# Patient Record
Sex: Female | Born: 1960
Health system: Southern US, Community
[De-identification: ages and names within clinical notes are randomized; demographics above are authoritative.]

## PROBLEM LIST (undated history)

## (undated) DIAGNOSIS — I10 Essential (primary) hypertension: Secondary | ICD-10-CM

## (undated) DIAGNOSIS — I00 Rheumatic fever without heart involvement: Secondary | ICD-10-CM

## (undated) HISTORY — PX: ABDOMINAL HYSTERECTOMY: SHX81

## (undated) HISTORY — DX: Essential (primary) hypertension: I10

## (undated) HISTORY — PX: TUBAL LIGATION: SHX77

## (undated) HISTORY — PX: AUGMENTATION MAMMAPLASTY: SUR837

## (undated) HISTORY — PX: OOPHORECTOMY: SHX86

## (undated) HISTORY — PX: TONSILLECTOMY: SHX5217

## (undated) HISTORY — PX: RIGHT OOPHORECTOMY: SHX2359

---

## 1984-12-21 DIAGNOSIS — I00 Rheumatic fever without heart involvement: Secondary | ICD-10-CM

## 1984-12-21 HISTORY — DX: Rheumatic fever without heart involvement: I00

## 2000-12-21 HISTORY — PX: CARDIAC CATHETERIZATION: SHX172

## 2006-10-18 ENCOUNTER — Ambulatory Visit: Payer: Self-pay | Admitting: *Deleted

## 2006-10-29 ENCOUNTER — Ambulatory Visit: Payer: Self-pay | Admitting: *Deleted

## 2006-11-04 ENCOUNTER — Ambulatory Visit: Payer: Self-pay | Admitting: Obstetrics and Gynecology

## 2006-12-21 HISTORY — PX: MASTOPEXY: SUR857

## 2008-10-29 ENCOUNTER — Ambulatory Visit: Payer: Self-pay | Admitting: Internal Medicine

## 2009-10-30 ENCOUNTER — Ambulatory Visit: Payer: Self-pay | Admitting: Internal Medicine

## 2010-10-31 ENCOUNTER — Ambulatory Visit: Payer: Self-pay | Admitting: Internal Medicine

## 2010-11-05 ENCOUNTER — Ambulatory Visit: Payer: Self-pay | Admitting: Internal Medicine

## 2011-11-09 ENCOUNTER — Other Ambulatory Visit: Payer: Self-pay | Admitting: Internal Medicine

## 2011-11-09 MED ORDER — LISINOPRIL-HYDROCHLOROTHIAZIDE 20-12.5 MG PO TABS: 1.0000 | ORAL_TABLET | Freq: Every day | ORAL | Status: DC

## 2011-12-04 ENCOUNTER — Ambulatory Visit (INDEPENDENT_AMBULATORY_CARE_PROVIDER_SITE_OTHER): Payer: BC Managed Care – PPO | Admitting: Internal Medicine

## 2011-12-04 ENCOUNTER — Encounter: Payer: Self-pay | Admitting: Internal Medicine

## 2011-12-04 DIAGNOSIS — Z1239 Encounter for other screening for malignant neoplasm of breast: Secondary | ICD-10-CM

## 2011-12-04 DIAGNOSIS — Z79899 Other long term (current) drug therapy: Secondary | ICD-10-CM

## 2011-12-04 LAB — BASIC METABOLIC PANEL
CO2: 28 mEq/L (ref 19–32)
Calcium: 9.8 mg/dL (ref 8.4–10.5)
Chloride: 106 mEq/L (ref 96–112)
Glucose, Bld: 81 mg/dL (ref 70–99)
Potassium: 4.1 mEq/L (ref 3.5–5.3)
Sodium: 142 mEq/L (ref 135–145)

## 2011-12-04 NOTE — Progress Notes (Signed)
  Subjective:    Patient ID: Curly Rim, female    DOB: 05/07/1961, 50 y.o.   MRN: [id number]  HPI  Right middle felxro tendon nodule painful only with gripping the steering wheel or car seat    Review of Systems     Objective:   Physical Exam        Assessment & Plan:   Subjective:     Raelyn Racette is a 50 y.o. female here for a routine exam.  Current complaints: WEIGHT GAIN.  Personal health questionnaire reviewed: no.   Gynecologic History No LMP recorded. Contraception: none Last Pap: 2009. Results were: normal Last mammogram: 2011. Results were: normal  Obstetric History OB History    Grav Para Term Preterm Abortions TAB SAB Ect Mult Living                   The following portions of the patient's history were reviewed and updated as appropriate: allergies, current medications, past family history, past medical history, past social history, past surgical history and problem list.  Review of Systems A comprehensive review of systems was negative.    Objective:    BP 120/82  Pulse 87  Temp(Src) 98.1 F (36.7 C) (Oral)  Ht 5\' 3"  (1.6 m)  Wt 153 lb (69.4 kg)  BMI 27.10 kg/m2  SpO2 98% General appearance: alert, cooperative and appears stated age Eyes: conjunctivae/corneas clear. PERRL, EOM's intact. Fundi benign. Ears: normal TM's and external ear canals both ears Nose: Nares normal. Septum midline. Mucosa normal. No drainage or sinus tenderness. Throat: lips, mucosa, and tongue normal; teeth and gums normal Neck: no adenopathy, no carotid bruit, no JVD, supple, symmetrical, trachea midline and thyroid not enlarged, symmetric, no tenderness/mass/nodules Lungs: clear to auscultation bilaterally Breasts: normal appearance, no masses or tenderness Heart: regular rate and rhythm, S1, S2 normal, no murmur, click, rub or gallop Abdomen: soft, non-tender; bowel sounds normal; no masses,  no organomegaly Extremities: extremities normal, atraumatic, no  cyanosis or edema Pulses: 2+ and symmetric Neurologic: Grossly normal    Assessment:    Healthy female exam.    Plan:    Education reviewed: calcium supplements, low fat, low cholesterol diet, self breast exams and weight bearing exercise. Mammogram ordered. Follow up in: 6 months.

## 2011-12-06 ENCOUNTER — Encounter: Payer: Self-pay | Admitting: Internal Medicine

## 2011-12-08 ENCOUNTER — Ambulatory Visit: Payer: Self-pay | Admitting: Internal Medicine

## 2011-12-09 ENCOUNTER — Telehealth: Payer: Self-pay | Admitting: Internal Medicine

## 2011-12-09 ENCOUNTER — Ambulatory Visit: Payer: Self-pay | Admitting: Internal Medicine

## 2011-12-09 NOTE — Telephone Encounter (Signed)
Left message on voicemail at work for patient to return call. 

## 2011-12-09 NOTE — Telephone Encounter (Signed)
Her mammogram of the left breast was abnormal and they should  have contacted her for additional images and ultrasound. Please confirm

## 2011-12-10 ENCOUNTER — Telehealth: Payer: Self-pay | Admitting: Internal Medicine

## 2011-12-10 NOTE — Telephone Encounter (Signed)
Left message asking patient to return call. 

## 2011-12-10 NOTE — Telephone Encounter (Signed)
Ultrasound of suspicious area on left breast is reporting a simple cyst, probably benign. To repeat mammo and u/s on left breast in 6 months. The alternative is a second opinion at [facility] or locally with eval by Dr. Lemar Livings or Evette Cristal

## 2011-12-10 NOTE — Telephone Encounter (Signed)
Left message asking patient to return my call.

## 2011-12-11 NOTE — Telephone Encounter (Signed)
Patient went for her repeat mamm and it was okayed to follow up in 6 months. Patient already has appt for mamm in 6 months.

## 2012-01-04 ENCOUNTER — Encounter: Payer: Self-pay | Admitting: Internal Medicine

## 2012-01-06 ENCOUNTER — Encounter: Payer: Self-pay | Admitting: Internal Medicine

## 2012-01-07 ENCOUNTER — Telehealth: Payer: Self-pay | Admitting: Internal Medicine

## 2012-01-07 NOTE — Telephone Encounter (Signed)
Her

## 2012-01-11 ENCOUNTER — Ambulatory Visit (INDEPENDENT_AMBULATORY_CARE_PROVIDER_SITE_OTHER): Payer: BC Managed Care – PPO | Admitting: Internal Medicine

## 2012-01-11 ENCOUNTER — Encounter: Payer: Self-pay | Admitting: Internal Medicine

## 2012-01-11 VITALS — BP 124/82 | HR 76 | Temp 98.4°F | Wt 153.0 lb

## 2012-01-11 DIAGNOSIS — J40 Bronchitis, not specified as acute or chronic: Secondary | ICD-10-CM

## 2012-01-11 DIAGNOSIS — J1100 Influenza due to unidentified influenza virus with unspecified type of pneumonia: Secondary | ICD-10-CM | POA: Insufficient documentation

## 2012-01-11 MED ORDER — FLUCONAZOLE 150 MG PO TABS: 150.0000 mg | ORAL_TABLET | Freq: Every day | ORAL | Status: AC

## 2012-01-11 MED ORDER — AZITHROMYCIN 500 MG PO TABS: 500.0000 mg | ORAL_TABLET | Freq: Every day | ORAL | Status: AC

## 2012-01-11 NOTE — Progress Notes (Signed)
Subjective:    Patient ID: Carla Mckinney, female    DOB: 08/02/1961, 51 y.o.   MRN: [id number]   HPI  Healthy  51 yr old white female presents with  one week history of productive cough after caring for grandson who was diagnosed and treated ultimately for RSV .  Patient reports history of fevrrs to 102 and myalgias,  Feels her symptoms were similar to prrior occurrences of influenza.  Fever has resolved but she continues to have  cough productive of green sputum.  Worse in the morning,  Using nyquil for nighttime cough .  Cough is at times paroxysmal and has a whooping quality.  Past Medical History  Diagnosis Date  . Hypertension    Current Outpatient Prescriptions on File Prior to Visit  Medication Sig Dispense Refill  . furosemide (LASIX) 20 MG tablet Take 20 mg by mouth as needed.        Marland Kitchen lisinopril-hydrochlorothiazide (ZESTORETIC) 20-12.5 MG per tablet Take 1 tablet by mouth daily.  30 tablet  3     Review of Systems  Constitutional: Positive for fever and fatigue. Negative for chills and unexpected weight change.  HENT: Positive for postnasal drip. Negative for hearing loss, ear pain, nosebleeds, congestion, sore throat, facial swelling, rhinorrhea, sneezing, mouth sores, trouble swallowing, neck pain, neck stiffness, voice change, sinus pressure, tinnitus and ear discharge.   Eyes: Negative for pain, discharge, redness and visual disturbance.  Respiratory: Positive for cough. Negative for chest tightness, shortness of breath, wheezing and stridor.   Cardiovascular: Negative for chest pain, palpitations and leg swelling.  Musculoskeletal: Negative for myalgias and arthralgias.  Skin: Negative for color change and rash.  Neurological: Negative for dizziness, weakness, light-headedness and headaches.  Hematological: Negative for adenopathy.   BP 124/82  Pulse 76  Temp(Src) 98.4 F (36.9 C) (Oral)  Wt 153 lb (69.4 kg)  SpO2 98%      Objective:   Physical Exam    Constitutional: She is oriented to person, place, and time. She appears well-developed and well-nourished.  HENT:  Mouth/Throat: Posterior oropharyngeal erythema present.  Eyes: EOM are normal. Pupils are equal, round, and reactive to light. No scleral icterus.  Neck: Normal range of motion. Neck supple. No JVD present. No thyromegaly present.  Cardiovascular: Normal rate, regular rhythm, normal heart sounds and intact distal pulses.   Pulmonary/Chest: Effort normal and breath sounds normal.    Abdominal: Soft. Bowel sounds are normal. She exhibits no mass. There is no tenderness.  Musculoskeletal: Normal range of motion. She exhibits no edema.  Lymphadenopathy:    She has cervical adenopathy.       Right cervical: Superficial cervical and deep cervical adenopathy present.       Left cervical: Superficial cervical and deep cervical adenopathy present.  Neurological: She is alert and oriented to person, place, and time.  Skin: Skin is warm and dry.  Psychiatric: She has a normal mood and affect.      Assessment & Plan:   Pneumonia and influenza She is past the window of treatment for influenza, wo will not test or treat.  Will use azithromycin x 7 days for pneumonia . CXR in one week if no improvement in purulent sputum    Updated Medication List Outpatient Encounter Prescriptions as of 01/11/2012  Medication Sig Dispense Refill  . furosemide (LASIX) 20 MG tablet Take 20 mg by mouth as needed.        Marland Kitchen lisinopril-hydrochlorothiazide (ZESTORETIC) 20-12.5 MG per tablet Take 1 tablet  by mouth daily.  30 tablet  3  . azithromycin (ZITHROMAX) 500 MG tablet Take 1 tablet (500 mg total) by mouth daily.  7 tablet  0  . fluconazole (DIFLUCAN) 150 MG tablet Take 1 tablet (150 mg total) by mouth daily.  2 tablet  0

## 2012-01-11 NOTE — Assessment & Plan Note (Signed)
She is past the window of treatment for influenza, wo will not test or treat.  Will use azithromycin x 7 days for pneumonia . CXR in one week if no improvement in purulent sputum

## 2012-01-12 ENCOUNTER — Telehealth: Payer: Self-pay | Admitting: *Deleted

## 2012-01-12 MED ORDER — HYDROCOD POLST-CHLORPHEN POLST 10-8 MG/5ML PO LQCR: ORAL | Status: DC

## 2012-01-12 NOTE — Telephone Encounter (Signed)
Pt states she was seen in the office yesterday and would like to have tussionex called to [facility].  She says she discussed this with you yesterday.

## 2012-01-12 NOTE — Telephone Encounter (Signed)
Yes, you can call in Tussionex generic syrup  1 teaspoon  Every 12 hours as needed for cough  Qty 200 ml no refills. thanks

## 2012-01-12 NOTE — Telephone Encounter (Signed)
Medicine called to pharmacy. 

## 2012-02-02 ENCOUNTER — Other Ambulatory Visit: Payer: Self-pay | Admitting: *Deleted

## 2012-02-02 MED ORDER — FUROSEMIDE 20 MG PO TABS: 20.0000 mg | ORAL_TABLET | ORAL | Status: DC | PRN

## 2012-03-02 ENCOUNTER — Other Ambulatory Visit: Payer: Self-pay | Admitting: Internal Medicine

## 2012-03-02 MED ORDER — LISINOPRIL-HYDROCHLOROTHIAZIDE 20-12.5 MG PO TABS: 1.0000 | ORAL_TABLET | Freq: Every day | ORAL | Status: DC

## 2012-03-10 ENCOUNTER — Telehealth: Payer: Self-pay | Admitting: Internal Medicine

## 2012-03-10 NOTE — Telephone Encounter (Signed)
Did that letter come from [facility]?  Because now that i have looked through her chart,  The additional  Views were scanned in 3 months ago,   From [place]

## 2012-03-10 NOTE — Telephone Encounter (Signed)
I called patient like you asked to see if she had her additional views.  Patient stated she did have her additional views and they were normal.

## 2012-03-11 NOTE — Telephone Encounter (Signed)
Yes, the letter came from [place].  Patient stated they sent her one too and when she called them to say she already had the views done, they told her the letter went out before she had the views.

## 2012-03-28 ENCOUNTER — Encounter: Payer: Self-pay | Admitting: Internal Medicine

## 2012-05-10 ENCOUNTER — Telehealth: Payer: Self-pay | Admitting: Internal Medicine

## 2012-05-10 DIAGNOSIS — N63 Unspecified lump in unspecified breast: Secondary | ICD-10-CM

## 2012-05-10 NOTE — Telephone Encounter (Signed)
ON PRINTER

## 2012-05-10 NOTE — Telephone Encounter (Signed)
HEATHER @ [place] [phone] CALLED NEEDS ORDER FOR Carla Mckinney 6 MONTH FOLLOW UP  LEFT BREASTMAMMOGRAM WITH LEFT ULTRA SOUND

## 2012-05-11 NOTE — Telephone Encounter (Signed)
Order signed and sent to [facility]

## 2012-06-01 ENCOUNTER — Encounter: Payer: Self-pay | Admitting: Internal Medicine

## 2012-06-03 ENCOUNTER — Ambulatory Visit: Payer: BC Managed Care – PPO | Admitting: Internal Medicine

## 2012-06-08 ENCOUNTER — Ambulatory Visit (INDEPENDENT_AMBULATORY_CARE_PROVIDER_SITE_OTHER): Payer: BC Managed Care – PPO | Admitting: Internal Medicine

## 2012-06-08 ENCOUNTER — Encounter: Payer: Self-pay | Admitting: Internal Medicine

## 2012-06-08 VITALS — BP 104/78 | HR 76 | Temp 98.0°F | Resp 16 | Wt 136.2 lb

## 2012-06-08 DIAGNOSIS — Z79899 Other long term (current) drug therapy: Secondary | ICD-10-CM

## 2012-06-08 DIAGNOSIS — Z13220 Encounter for screening for lipoid disorders: Secondary | ICD-10-CM

## 2012-06-08 DIAGNOSIS — I10 Essential (primary) hypertension: Secondary | ICD-10-CM

## 2012-06-08 MED ORDER — LISINOPRIL 20 MG PO TABS: 20.0000 mg | ORAL_TABLET | Freq: Every day | ORAL | Status: DC

## 2012-06-12 ENCOUNTER — Encounter: Payer: Self-pay | Admitting: Internal Medicine

## 2012-06-12 NOTE — Progress Notes (Signed)
Patient ID: Carla Mckinney, female   DOB: 04-12-1961, 51 y.o.   MRN: [id number]  Follow up on hypertension. Subjective:    Patient here for follow-up of elevated blood pressure.  She is exercising and is adherent to a low-salt diet.  Blood pressure is well controlled at home. Cardiac symptoms: none. Patient denies: chest pain, dyspnea, irregular heart beat, palpitations and tachypnea. Cardiovascular risk factors: none. Use of agents associated with hypertension: none. History of target organ damage: none.  The following portions of the patient's history were reviewed and updated as appropriate: allergies, current medications, past family history, past medical history, past social history, past surgical history and problem list.  Review of Systems A comprehensive review of systems was negative.     Objective:    BP 104/78  Pulse 76  Temp 98 F (36.7 C) (Oral)  Resp 16  Wt 136 lb 4 oz (61.803 kg)  SpO2 98% General appearance: alert, cooperative and appears stated age Neck: no adenopathy, no carotid bruit, no JVD, supple, symmetrical, trachea midline and thyroid not enlarged, symmetric, no tenderness/mass/nodules Lungs: clear to auscultation bilaterally Heart: regular rate and rhythm, S1, S2 normal, no murmur, click, rub or gallop Abdomen: soft, non-tender; bowel sounds normal; no masses,  no organomegaly Extremities: extremities normal, atraumatic, no cyanosis or edema Pulses: 2+ and symmetric    Assessment:    Hypertension, stage 1  Well controlled.  Evidence of target organ damage: none.    Plan:    Medication: no change. Regular aerobic exercise. Check blood pressures weekly and record. Follow up: 6 months and as needed.  Fasting lipids and assessment of renal function due; return for labs

## 2012-06-13 ENCOUNTER — Ambulatory Visit: Payer: Self-pay | Admitting: Internal Medicine

## 2012-06-15 ENCOUNTER — Other Ambulatory Visit (INDEPENDENT_AMBULATORY_CARE_PROVIDER_SITE_OTHER): Payer: BC Managed Care – PPO | Admitting: *Deleted

## 2012-06-15 DIAGNOSIS — Z79899 Other long term (current) drug therapy: Secondary | ICD-10-CM

## 2012-06-15 DIAGNOSIS — Z13220 Encounter for screening for lipoid disorders: Secondary | ICD-10-CM

## 2012-06-15 LAB — LIPID PANEL
HDL: 63.5 mg/dL (ref >39.00)
LDL Cholesterol: 120 mg/dL — ABNORMAL HIGH (ref 0–99)
Total CHOL/HDL Ratio: 3
Triglycerides: 63 mg/dL (ref 0.0–149.0)
VLDL: 12.6 mg/dL (ref 0.0–40.0)

## 2012-06-15 LAB — COMPREHENSIVE METABOLIC PANEL
Albumin: 4 g/dL (ref 3.5–5.2)
Calcium: 9.4 mg/dL (ref 8.4–10.5)
Creatinine, Ser: 0.5 mg/dL (ref 0.4–1.2)
Total Bilirubin: 0.7 mg/dL (ref 0.3–1.2)
Total Protein: 6.7 g/dL (ref 6.0–8.3)

## 2012-06-16 ENCOUNTER — Encounter: Payer: Self-pay | Admitting: Internal Medicine

## 2012-06-16 ENCOUNTER — Telehealth: Payer: Self-pay | Admitting: Internal Medicine

## 2012-06-16 NOTE — Telephone Encounter (Signed)
i called to see if pt had mammogram Pt stated she had mammogram done at [facility] on 6/24  She also had labs done yesterday and wanted to see if she could get results sometime today or tomorrow.  She is leaving town on Saturday for vacation and wanted to get results before if possible

## 2012-06-16 NOTE — Telephone Encounter (Signed)
I will call patient with her results as soon as Dr. Darrick Huntsman lets me know.

## 2012-06-16 NOTE — Telephone Encounter (Signed)
Patient left a voicemail on my phone stating she was returning your call.

## 2012-06-21 ENCOUNTER — Other Ambulatory Visit: Payer: Self-pay | Admitting: Internal Medicine

## 2012-06-21 ENCOUNTER — Telehealth: Payer: Self-pay | Admitting: Internal Medicine

## 2012-06-21 NOTE — Telephone Encounter (Signed)
The status under the message states that she read it.

## 2012-06-21 NOTE — Telephone Encounter (Signed)
Left message asking patient to return my call.

## 2012-06-21 NOTE — Telephone Encounter (Signed)
I sent message to pt this morning via e mail.  Please confirm that she got it

## 2012-07-11 ENCOUNTER — Encounter: Payer: Self-pay | Admitting: Internal Medicine

## 2013-02-05 ENCOUNTER — Other Ambulatory Visit: Payer: Self-pay

## 2013-02-07 ENCOUNTER — Ambulatory Visit (INDEPENDENT_AMBULATORY_CARE_PROVIDER_SITE_OTHER): Payer: BC Managed Care – PPO | Admitting: Internal Medicine

## 2013-02-07 ENCOUNTER — Encounter: Payer: Self-pay | Admitting: Internal Medicine

## 2013-02-07 VITALS — BP 122/80 | HR 69 | Temp 98.1°F | Resp 16 | Wt 145.2 lb

## 2013-02-07 DIAGNOSIS — J019 Acute sinusitis, unspecified: Secondary | ICD-10-CM

## 2013-02-07 MED ORDER — PREDNISONE (PAK) 10 MG PO TABS: ORAL_TABLET | ORAL | Status: DC

## 2013-02-07 MED ORDER — CULTURELLE DIGESTIVE HEALTH PO CAPS: 1.0000 | ORAL_CAPSULE | Freq: Every day | ORAL | Status: DC

## 2013-02-07 MED ORDER — BENZONATATE 200 MG PO CAPS: 200.0000 mg | ORAL_CAPSULE | Freq: Two times a day (BID) | ORAL | Status: DC | PRN

## 2013-02-07 MED ORDER — LEVOFLOXACIN 500 MG PO TABS: 500.0000 mg | ORAL_TABLET | Freq: Every day | ORAL | Status: DC

## 2013-02-07 NOTE — Patient Instructions (Addendum)
You have a sinus/ear infection   .  I am prescribing an antibiotic (levaquin ) and prednisone taper  To manage the infectin and the inflammation in your ear/sinuses.   I also advise use of the following OTC meds to help with your other symptoms.   Take generic OTC benadryl 25 mg every 8 hours for the drainage,  Sudafed PE  10 to 30 mg every 8 hours for the congestion, you may substitute Afrin nasal spray for the nighttime dose of sudafed PE  If needed to prevent insomnia.  flushes your sinuses twice daily with Simply Saline (do over the sink because if you do it right you will spit out globs of mucus)  Use benzonatate capsules  FOR THE COUGH.  Gargle with salt water as needed for sore throat.

## 2013-02-07 NOTE — Progress Notes (Signed)
Patient ID: Carla Mckinney, female   DOB: September 27, 1961, 52 y.o.   MRN: [id number]  Patient Active Problem List  Diagnosis  . Acute sinusitis, unspecified    Subjective:  CC:   Chief Complaint  Patient presents with  . Cough    yellow/greem phlegm    HPI: Sinus congestion,  Cough productive of brown blood streaked sputum but alsop having similar discharge from sinuses.  Her symptoms started a week  Going to [facility] on Saturday.   Waking up with eyes matted shut.  No flu like symptoms. Cough keeping her up at night .  Has tussionex but has not tried it .    Carla Thompsonis a 52 y.o. female who presents   Past Medical History  Diagnosis Date  . Hypertension     Past Surgical History  Procedure Laterality Date  . Tubal ligation    . Abdominal hysterectomy    . Oophorectomy      secondary to scar tissue,  [facility]  . Cardiac catheterization  2002    normal, due to syncope and abnormal myowiew ([facility])       The following portions of the patient's history were reviewed and updated as appropriate: Allergies, current medications, and problem list.    Review of Systems:   12 Pt  review of systems was negative except those addressed in the HPI.      History   Social History  . Marital Status: Married    Spouse Name: N/A    Number of Children: N/A  . Years of Education: N/A   Occupational History  . Not on file.   Social History Main Topics  . Smoking status: Never Smoker   . Smokeless tobacco: Never Used  . Alcohol Use: Yes  . Drug Use: No  . Sexually Active: Not on file   Other Topics Concern  . Not on file   Social History Narrative  . No narrative on file    Objective:  BP 122/80  Pulse 69  Temp(Src) 98.1 F (36.7 C) (Oral)  Resp 16  Wt 145 lb 4 oz (65.885 kg)  BMI 25.74 kg/m2  SpO2 93%  General appearance: alert, cooperative and appears stated age Ears: bilalteral effusions and mild injection of TM.  Sinuses tender maxillary bilaterally Throat:  lips, mucosa, and tongue normal; teeth and gums normal. Tonsils red.  Neck: mild cervical adenopathy, no carotid bruit, supple, symmetrical, trachea midline and thyroid not enlarged, symmetric, no tenderness/mass/nodules Back: symmetric, no curvature. ROM normal. No CVA tenderness. Lungs: clear to auscultation bilaterally Heart: regular rate and rhythm, S1, S2 normal, no murmur, click, rub or gallop Abdomen: soft, non-tender; bowel sounds normal; no masses,  no organomegaly Pulses: 2+ and symmetric Skin: Skin color, texture, turgor normal. No rashes or lesions Lymph nodes: Cervical, supraclavicular, and axillary nodes normal.  Assessment and Plan:  Acute sinusitis, unspecified Given chronicity of symptoms, development of facial pain and exam consistent with bacterial URI,  Will treat with empiric antibiotics, decongestants, and saline lavage.    Updated Medication List Outpatient Encounter Prescriptions as of 02/07/2013  Medication Sig Dispense Refill  . furosemide (LASIX) 20 MG tablet Take 1 tablet (20 mg total) by mouth as needed.  30 tablet  2  . lisinopril (PRINIVIL,ZESTRIL) 20 MG tablet Take 1 tablet (20 mg total) by mouth daily.  90 tablet  3  . benzonatate (TESSALON) 200 MG capsule Take 1 capsule (200 mg total) by mouth 2 (two) times daily as needed for cough.  60 capsule  0  . Lactobacillus-Inulin (CULTURELLE DIGESTIVE HEALTH) CAPS Take 1 capsule by mouth daily.  14 capsule  0  . levofloxacin (LEVAQUIN) 500 MG tablet Take 1 tablet (500 mg total) by mouth daily.  7 tablet  0  . predniSONE (STERAPRED UNI-PAK) 10 MG tablet 6 tablets on Day 1 , then reduce by 1 tablet daily until gone  21 tablet  0   No facility-administered encounter medications on file as of 02/07/2013.     No orders of the defined types were placed in this encounter.    No Follow-up on file.

## 2013-02-08 ENCOUNTER — Encounter: Payer: Self-pay | Admitting: Internal Medicine

## 2013-02-08 DIAGNOSIS — J019 Acute sinusitis, unspecified: Secondary | ICD-10-CM | POA: Insufficient documentation

## 2013-02-08 NOTE — Assessment & Plan Note (Signed)
Given chronicity of symptoms, development of facial pain and exam consistent with bacterial URI,  Will treat with empiric antibiotics, decongestants, and saline lavage.   

## 2013-02-22 ENCOUNTER — Encounter: Payer: Self-pay | Admitting: Internal Medicine

## 2013-02-23 ENCOUNTER — Encounter: Payer: Self-pay | Admitting: Internal Medicine

## 2013-02-23 ENCOUNTER — Ambulatory Visit (INDEPENDENT_AMBULATORY_CARE_PROVIDER_SITE_OTHER): Payer: BC Managed Care – PPO | Admitting: Internal Medicine

## 2013-02-23 VITALS — BP 108/80 | HR 72 | Temp 98.2°F | Resp 16 | Ht 64.0 in | Wt 146.0 lb

## 2013-02-23 DIAGNOSIS — E785 Hyperlipidemia, unspecified: Secondary | ICD-10-CM

## 2013-02-23 DIAGNOSIS — I10 Essential (primary) hypertension: Secondary | ICD-10-CM

## 2013-02-23 DIAGNOSIS — A499 Bacterial infection, unspecified: Secondary | ICD-10-CM

## 2013-02-23 DIAGNOSIS — N63 Unspecified lump in unspecified breast: Secondary | ICD-10-CM

## 2013-02-23 DIAGNOSIS — R928 Other abnormal and inconclusive findings on diagnostic imaging of breast: Secondary | ICD-10-CM

## 2013-02-23 DIAGNOSIS — R040 Epistaxis: Secondary | ICD-10-CM

## 2013-02-23 DIAGNOSIS — R5383 Other fatigue: Secondary | ICD-10-CM

## 2013-02-23 DIAGNOSIS — R5381 Other malaise: Secondary | ICD-10-CM

## 2013-02-23 DIAGNOSIS — B9689 Other specified bacterial agents as the cause of diseases classified elsewhere: Secondary | ICD-10-CM

## 2013-02-23 DIAGNOSIS — J329 Chronic sinusitis, unspecified: Secondary | ICD-10-CM

## 2013-02-23 LAB — LIPID PANEL
Cholesterol: 226 mg/dL — ABNORMAL HIGH (ref 0–200)
Total CHOL/HDL Ratio: 3
Triglycerides: 50 mg/dL (ref 0.0–149.0)
VLDL: 10 mg/dL (ref 0.0–40.0)

## 2013-02-23 LAB — CBC WITH DIFFERENTIAL/PLATELET
Basophils Absolute: 0 10*3/uL (ref 0.0–0.1)
Eosinophils Absolute: 0.1 10*3/uL (ref 0.0–0.7)
Lymphocytes Relative: 30.8 % (ref 12.0–46.0)
MCHC: 34.2 g/dL (ref 30.0–36.0)
MCV: 94.1 fl (ref 78.0–100.0)
Monocytes Absolute: 0.4 10*3/uL (ref 0.1–1.0)
Neutrophils Relative %: 58.3 % (ref 43.0–77.0)
Platelets: 236 10*3/uL (ref 150.0–400.0)
RBC: 3.97 Mil/uL (ref 3.87–5.11)
RDW: 13.5 % (ref 11.5–14.6)

## 2013-02-23 LAB — COMPREHENSIVE METABOLIC PANEL
AST: 25 U/L (ref 0–37)
Albumin: 4 g/dL (ref 3.5–5.2)
Alkaline Phosphatase: 48 U/L (ref 39–117)
Glucose, Bld: 84 mg/dL (ref 70–99)
Potassium: 4.2 mEq/L (ref 3.5–5.1)
Sodium: 136 mEq/L (ref 135–145)
Total Bilirubin: 1.2 mg/dL (ref 0.3–1.2)
Total Protein: 6.8 g/dL (ref 6.0–8.3)

## 2013-02-23 LAB — LDL CHOLESTEROL, DIRECT: Direct LDL: 154.1 mg/dL

## 2013-02-23 LAB — TSH: TSH: 1.93 u[IU]/mL (ref 0.35–5.50)

## 2013-02-23 NOTE — Progress Notes (Signed)
Patient ID: Carla Mckinney, female   DOB: 07-12-1961, 52 y.o.   MRN: [id number]   . Patient Active Problem List  Diagnosis  . Sinusitis, bacterial  . Essential hypertension, benign  . Other and unspecified hyperlipidemia    Subjective:  CC:   Chief Complaint  Patient presents with  . Med Refill    HPI:   Carla Thompsonis a 52 y.o. female who presents for 6 month follow up on hypertension.  She was recently treated for Sinusitis with antibiotics and steroid taper  .  All symptoms of cough, headache,  sinus congestion,  and facial/ear pain resolved but she continues to have streaks of blood in her nasal drainage. She believes that the blood is coming from both sides.      Past Medical History  Diagnosis Date  . Hypertension     Past Surgical History  Procedure Laterality Date  . Tubal ligation    . Abdominal hysterectomy    . Oophorectomy      secondary to scar tissue,  [facility]  . Cardiac catheterization  2002    normal, due to syncope and abnormal myowiew ([facility])       The following portions of the patient's history were reviewed and updated as appropriate: Allergies, current medications, and problem list.    Review of Systems:   Patient denies headache, fevers, malaise, unintentional weight loss, skin rash, eye pain, sinus congestion and sinus pain, sore throat, dysphagia,  hemoptysis , cough, dyspnea, wheezing, chest pain, palpitations, orthopnea, edema, abdominal pain, nausea, melena, diarrhea, constipation, flank pain, dysuria, hematuria, urinary  Frequency, nocturia, numbness, tingling, seizures,  Focal weakness, Loss of consciousness,  Tremor, insomnia, depression, anxiety, and suicidal ideation.     History   Social History  . Marital Status: Married    Spouse Name: N/A    Number of Children: N/A  . Years of Education: N/A   Occupational History  . Not on file.   Social History Main Topics  . Smoking status: Never Smoker   . Smokeless tobacco: Never  Used  . Alcohol Use: Yes  . Drug Use: No  . Sexually Active: Not on file   Other Topics Concern  . Not on file   Social History Narrative  . No narrative on file    Objective:  BP 108/80  Pulse 72  Temp(Src) 98.2 F (36.8 C) (Oral)  Resp 16  Ht 5\' 4"  (1.626 m)  Wt 146 lb (66.225 kg)  BMI 25.05 kg/m2  SpO2 98%  General appearance: alert, cooperative and appears stated age Ears/Nose: normal TM's and external ear canals both ears. Left nasal septum red and ulcerated. Throat: lips, mucosa, and tongue normal; teeth and gums normal Neck: no adenopathy, no carotid bruit, supple, symmetrical, trachea midline and thyroid not enlarged, symmetric, no tenderness/mass/nodules Back: symmetric, no curvature. ROM normal. No CVA tenderness. Lungs: clear to auscultation bilaterally Heart: regular rate and rhythm, S1, S2 normal, no murmur, click, rub or gallop Abdomen: soft, non-tender; bowel sounds normal; no masses,  no organomegaly Pulses: 2+ and symmetric Skin: Skin color, texture, turgor normal. No rashes or lesions Lymph nodes: Cervical, supraclavicular, and axillary nodes normal.  Assessment and Plan:  Sinusitis, bacterial Her symptoms have resolved with seven-day course of Levaquin and 6 day prednisone taper, with the exception of continued bloody nasal drainage. She does have an ulcerated area on her left septal wall. Platelet count was normal today and there are no signs of infection. Advised to use saline irrigations and  apply sterile petroleum jelly to septal wall for moisturization. If symptoms fail to resolve she'll need to see ENT for fiberoptic evaluation .   Essential hypertension, benign Well controlled with low-dose lisinopril and when necessary furosemide.. renal function is normal.  Other and unspecified hyperlipidemia Mild, LDL 154. Her risk factors include hypertension and a family history. However her mother's coronary artery disease is likely accelerated by her  tobacco abuse. As long as her HDL remains high at 68 I see no reason to start her on statin therapy.   Updated Medication List Outpatient Encounter Prescriptions as of 02/23/2013  Medication Sig Dispense Refill  . furosemide (LASIX) 20 MG tablet Take 1 tablet (20 mg total) by mouth as needed.  30 tablet  2  . Lactobacillus-Inulin (CULTURELLE DIGESTIVE HEALTH) CAPS Take 1 capsule by mouth daily.  14 capsule  0  . levofloxacin (LEVAQUIN) 500 MG tablet Take 1 tablet (500 mg total) by mouth daily.  7 tablet  0  . lisinopril (PRINIVIL,ZESTRIL) 20 MG tablet Take 1 tablet (20 mg total) by mouth daily.  90 tablet  3  . [DISCONTINUED] benzonatate (TESSALON) 200 MG capsule Take 1 capsule (200 mg total) by mouth 2 (two) times daily as needed for cough.  60 capsule  0  . [DISCONTINUED] predniSONE (STERAPRED UNI-PAK) 10 MG tablet 6 tablets on Day 1 , then reduce by 1 tablet daily until gone  21 tablet  0   No facility-administered encounter medications on file as of 02/23/2013.     Orders Placed This Encounter  Procedures  . Comprehensive metabolic panel  . Lipid panel  . CBC with Differential  . TSH  . LDL cholesterol, direct  . Ambulatory referral to General Surgery    No Follow-up on file.

## 2013-02-24 DIAGNOSIS — I10 Essential (primary) hypertension: Secondary | ICD-10-CM | POA: Insufficient documentation

## 2013-02-24 DIAGNOSIS — J069 Acute upper respiratory infection, unspecified: Secondary | ICD-10-CM | POA: Insufficient documentation

## 2013-02-24 DIAGNOSIS — E7849 Other hyperlipidemia: Secondary | ICD-10-CM | POA: Insufficient documentation

## 2013-02-24 DIAGNOSIS — E785 Hyperlipidemia, unspecified: Secondary | ICD-10-CM | POA: Insufficient documentation

## 2013-02-24 NOTE — Assessment & Plan Note (Addendum)
Her symptoms have resolved with seven-day course of Levaquin and 6 day prednisone taper, with the exception of continued bloody nasal drainage. She does have an ulcerated area on her left septal wall. Platelet count was normal today and there are no signs of infection. Advised to use saline irrigations and apply sterile petroleum jelly to septal wall for moisturization. If symptoms fail to resolve she'll need to see ENT for fiberoptic evaluation .

## 2013-02-24 NOTE — Assessment & Plan Note (Signed)
Mild, LDL 154. Her risk factors include hypertension and a family history. However her mother's coronary artery disease is likely accelerated by her tobacco abuse. As long as her HDL remains high at 68 I see no reason to start her on statin therapy.

## 2013-02-24 NOTE — Assessment & Plan Note (Signed)
Well controlled with low-dose lisinopril and when necessary furosemide.. renal function is normal.

## 2013-03-23 ENCOUNTER — Encounter: Payer: Self-pay | Admitting: General Surgery

## 2013-03-23 ENCOUNTER — Inpatient Hospital Stay
Admission: RE | Admit: 2013-03-23 | Discharge: 2013-03-23 | Disposition: A | Discharge status: Home / Self Care | Payer: Self-pay | Source: Ambulatory Visit | Attending: General Surgery | Admitting: General Surgery

## 2013-03-23 ENCOUNTER — Ambulatory Visit (INDEPENDENT_AMBULATORY_CARE_PROVIDER_SITE_OTHER): Payer: BC Managed Care – PPO | Admitting: General Surgery

## 2013-03-23 VITALS — BP 120/70 | HR 88 | Resp 12 | Ht 63.0 in | Wt 146.0 lb

## 2013-03-23 DIAGNOSIS — N6002 Solitary cyst of left breast: Secondary | ICD-10-CM | POA: Insufficient documentation

## 2013-03-23 DIAGNOSIS — N6009 Solitary cyst of unspecified breast: Secondary | ICD-10-CM

## 2013-03-23 NOTE — Patient Instructions (Addendum)
Patient advised to leave cyst alone as long as she has no problems associated with the left breast. Patient also advised to discuss with primary care provider about hormone therapy. She is encouraged to do monthly self breast exams.

## 2013-03-23 NOTE — Progress Notes (Signed)
Patient ID: Carla Mckinney, female   DOB: 03/09/1961, 52 y.o.   MRN: [id number]  Chief Complaint  Patient presents with  . Cyst    new patient left breat cyst    HPI Carla Mckinney is a 52 y.o. female who presents for a left breast cyst. She states the cyst has been there approximately 6 years or longer. She is not having any pain or discomfort in this area. Primary care provider seems to think it is getting larger so wanted patient to have it checked out. No known personal or family history of breast problems. The patient did have a mastopexy done approximately around 2008.  HPI  Past Medical History  Diagnosis Date  . Hypertension     Past Surgical History  Procedure Laterality Date  . Tubal ligation    . Abdominal hysterectomy    . Oophorectomy      secondary to scar tissue,  [facility]  . Cardiac catheterization  2002    normal, due to syncope and abnormal myowiew ([facility])  . Tonsillectomy    . Mastopexy  2008    Family History  Problem Relation Age of Onset  . Heart disease Mother   . Hypertension Father   . Hyperlipidemia Father   . Heart disease Maternal Grandfather   . Heart disease Paternal Grandmother   . Stroke Paternal Grandmother 67  . Cancer Paternal Grandfather     multiple myeloma  . Heart failure Mother     Social History History  Substance Use Topics  . Smoking status: Never Smoker   . Smokeless tobacco: Never Used  . Alcohol Use: Yes    No Known Allergies  Current Outpatient Prescriptions  Medication Sig Dispense Refill  . lisinopril (PRINIVIL,ZESTRIL) 20 MG tablet Take 1 tablet (20 mg total) by mouth daily.  90 tablet  3  . Multiple Vitamin (MULTIVITAMIN) capsule Take 1 capsule by mouth daily.       No current facility-administered medications for this visit.    Review of Systems Review of Systems  Constitutional: Negative.   Respiratory: Negative.   Cardiovascular: Negative.     Blood pressure 120/70, pulse 88, resp. rate 12, height 5\' 3"   (1.6 m), weight 146 lb (66.225 kg).  Physical Exam Physical Exam  Constitutional: She appears well-developed and well-nourished.  Neck: Trachea normal. No mass and no thyromegaly present.  Cardiovascular: Normal rate, regular rhythm, normal heart sounds and normal pulses.   No murmur heard. Pulmonary/Chest: Effort normal and breath sounds normal. Right breast exhibits no inverted nipple, no mass, no nipple discharge, no skin change and no tenderness. Left breast exhibits no inverted nipple, no mass, no nipple discharge, no skin change and no tenderness. Breasts are symmetrical.  Left breast cyst noted.   Soft, mobile mass in the lateral aspect of the left breast.  Data Reviewed Ultrasound examination of this area of concern in the left breast:  November 2011 showed a 0.7 x 1.3 cm mass.  December 2012 this was a 1 x 2 cm mass.  June 2013 this was a 0.95 x 3.01 cm mass.  Ultrasound examination completed today showed a 1.33 x 1.96 x 2.41 cm simple cyst in the 3:00 position of the left breast 8 cm from the nipple. No internal echoes, wall thickening or nodularity was noted. Good posterior acoustic enhancement.  Assessment    Asymptomatic left breast cyst.     Plan    Observation is warranted. The patient should continue annual clinical exams with her  primary care provider as well as annual screening mammograms.        Earline Mayotte 03/23/2013, 10:01 PM

## 2013-03-24 ENCOUNTER — Encounter: Payer: Self-pay | Admitting: Internal Medicine

## 2013-04-18 MED ORDER — ESTRADIOL 0.0375 MG/24HR TD PTTW: 1.0000 | MEDICATED_PATCH | TRANSDERMAL | Status: DC

## 2013-08-08 ENCOUNTER — Other Ambulatory Visit: Payer: Self-pay | Admitting: *Deleted

## 2013-08-08 MED ORDER — LISINOPRIL 20 MG PO TABS: 20.0000 mg | ORAL_TABLET | Freq: Every day | ORAL | Status: DC

## 2013-10-26 ENCOUNTER — Other Ambulatory Visit: Payer: Self-pay

## 2013-10-30 ENCOUNTER — Ambulatory Visit (INDEPENDENT_AMBULATORY_CARE_PROVIDER_SITE_OTHER): Payer: Managed Care, Other (non HMO) | Admitting: *Deleted

## 2013-10-30 DIAGNOSIS — Z111 Encounter for screening for respiratory tuberculosis: Secondary | ICD-10-CM

## 2013-11-01 ENCOUNTER — Ambulatory Visit: Payer: Managed Care, Other (non HMO)

## 2013-11-20 ENCOUNTER — Ambulatory Visit: Payer: Self-pay | Admitting: Internal Medicine

## 2013-12-07 ENCOUNTER — Encounter: Payer: Self-pay | Admitting: Internal Medicine

## 2013-12-11 ENCOUNTER — Ambulatory Visit: Payer: Self-pay | Admitting: Otolaryngology

## 2013-12-20 ENCOUNTER — Ambulatory Visit: Payer: Self-pay | Admitting: Internal Medicine

## 2013-12-20 ENCOUNTER — Ambulatory Visit (INDEPENDENT_AMBULATORY_CARE_PROVIDER_SITE_OTHER): Payer: Managed Care, Other (non HMO) | Admitting: Internal Medicine

## 2013-12-20 VITALS — BP 115/60 | HR 69 | Wt 153.0 lb

## 2013-12-20 DIAGNOSIS — I10 Essential (primary) hypertension: Secondary | ICD-10-CM

## 2013-12-20 DIAGNOSIS — R600 Localized edema: Secondary | ICD-10-CM

## 2013-12-20 DIAGNOSIS — R609 Edema, unspecified: Secondary | ICD-10-CM

## 2013-12-20 DIAGNOSIS — R918 Other nonspecific abnormal finding of lung field: Secondary | ICD-10-CM

## 2013-12-20 DIAGNOSIS — R220 Localized swelling, mass and lump, head: Secondary | ICD-10-CM

## 2013-12-20 DIAGNOSIS — R9389 Abnormal findings on diagnostic imaging of other specified body structures: Secondary | ICD-10-CM

## 2013-12-20 NOTE — Progress Notes (Signed)
Patient ID: Carla Mckinney, female   DOB: 1961/10/18, 52 y.o.   MRN: [id number]  Patient Active Problem List   Diagnosis Date Noted  . Facial edema 12/21/2013  . Facial swelling 12/21/2013  . Cyst of breast, left, benign solitary 03/23/2013  . Essential hypertension, benign 02/24/2013  . Other and unspecified hyperlipidemia 02/24/2013    Subjective:  CC:   Chief Complaint  Patient presents with  . Acute Visit    facial swelling x2wks ago. worse in the mornings. pt states it comes and goes.     HPI:   Carla Thompsonis a 52 y.o. female who presents Recent development of recurrent facial sweeling. First epiosde was Severe facial and occurred dec 18th  Treated with zyrtec for 3 days no better.  No pruritis of skin or eyes.  Labs done and chest x ray  Done, ordered by [facility].  Nothing revealing.  Her lips were slightly swollen.  No tongue swelling,  No cold symptoms ,  No dyspnea or wheezing. Had not traveled, but ate [place] [employer] the night before.  and had a  crabcake  which she had before.  Swelling has improvoed markeldy but she continues to notice puffiness under eyes whe she wakes up.     Past Medical History  Diagnosis Date  . Hypertension     Past Surgical History  Procedure Laterality Date  . Tubal ligation    . Abdominal hysterectomy    . Oophorectomy      secondary to scar tissue,  [facility]  . Cardiac catheterization  2002    normal, due to syncope and abnormal myowiew ([facility])  . Tonsillectomy    . Mastopexy  2008       The following portions of the patient's history were reviewed and updated as appropriate: Allergies, current medications, and problem list.    Review of Systems:   12 Pt  review of systems was negative except those addressed in the HPI,     History   Social History  . Marital Status: Married    Spouse Name: N/A    Number of Children: N/A  . Years of Education: N/A   Occupational History  . Not on file.   Social History  Main Topics  . Smoking status: Never Smoker   . Smokeless tobacco: Never Used  . Alcohol Use: Yes  . Drug Use: No  . Sexual Activity: Not on file   Other Topics Concern  . Not on file   Social History Narrative  . No narrative on file    Objective:  Filed Vitals:   12/20/13 1336  BP: 115/60  Pulse: 69     General appearance: alert, cooperative and appears stated age Face; periorbital swellign noted,  Mild.  No cellulitis.  Ears: normal TM's and external ear canals both ears Throat: lips, mucosa, and tongue normal; teeth and gums normal Neck: no adenopathy, no carotid bruit, supple, symmetrical, trachea midline and thyroid not enlarged, symmetric, no tenderness/mass/nodules Back: symmetric, no curvature. ROM normal. No CVA tenderness. Lungs: clear to auscultation bilaterally Heart: regular rate and rhythm, S1, S2 normal, no murmur, click, rub or gallop Abdomen: soft, non-tender; bowel sounds normal; no masses,  no organomegaly Pulses: 2+ and symmetric Skin: Skin color, texture, turgor normal. No rashes or lesions Lymph nodes: Cervical, supraclavicular, and axillary nodes normal.  Assessment and Plan:  Essential hypertension, benign She has not taken lisinopril in over 2 weeks since the facila swelling.  BP is normal without  medications  Facial edema Unclear etiology: nephrotic syndrome, complement deficiency,  ACE inhibitor, tests pending to rule out above. Use furosemdie after 24 hr urine collection.    Updated Medication List Outpatient Encounter Prescriptions as of 12/20/2013  Medication Sig  . lisinopril (PRINIVIL,ZESTRIL) 20 MG tablet Take 1 tablet (20 mg total) by mouth daily.  . Multiple Vitamin (MULTIVITAMIN) capsule Take 1 capsule by mouth daily.  Marland Kitchen estradiol (VIVELLE-DOT) 0.0375 MG/24HR Place 1 patch onto the skin 2 (two) times a week.     Orders Placed This Encounter  Procedures  . DG Chest Lordotic Only  . Creatinine, Urine, 24 Hour  . Protein,  Urine, 24 hour  . C4 complement  . C3 complement  . Complement component c1q  . Complement, Total (CH50)    No Follow-up on file.

## 2013-12-20 NOTE — Progress Notes (Signed)
Pre visit review using our clinic review tool, if applicable. No additional management support is needed unless otherwise documented below in the visit note. 

## 2013-12-20 NOTE — Patient Instructions (Addendum)
Do not resume the lisinopril .   You may resume furosemide  20 mg daily AFTER the urine has been collected for a 24 hr period.   Additional Chest x ray today

## 2013-12-21 DIAGNOSIS — R220 Localized swelling, mass and lump, head: Secondary | ICD-10-CM | POA: Insufficient documentation

## 2013-12-21 DIAGNOSIS — R600 Localized edema: Secondary | ICD-10-CM | POA: Insufficient documentation

## 2013-12-21 LAB — C4 COMPLEMENT: C4 Complement: 16 mg/dL (ref 10–40)

## 2013-12-21 LAB — C3 COMPLEMENT: C3 Complement: 105 mg/dL (ref 90–180)

## 2013-12-21 NOTE — Assessment & Plan Note (Signed)
She has not taken lisinopril in over 2 weeks since the facila swelling.  BP is normal without medications

## 2013-12-21 NOTE — Assessment & Plan Note (Signed)
Unclear etiology: nephrotic syndrome, complement deficiency,  ACE inhibitor, tests pending to rule out above. Use furosemdie after 24 hr urine collection.

## 2013-12-22 ENCOUNTER — Encounter: Payer: Self-pay | Admitting: Internal Medicine

## 2013-12-22 LAB — COMPLEMENT, TOTAL (CH50): Compl, Total (CH50): 61 U/mL — ABNORMAL HIGH (ref 22–60)

## 2013-12-22 NOTE — Addendum Note (Signed)
Addended by: Karlene Einstein D on: 12/22/2013 09:59 AM   Modules accepted: Orders

## 2013-12-23 LAB — CREATININE, URINE, 24 HOUR
CREATININE 24H UR: 473 mg/d — AB (ref 700–1800)
CREATININE, URINE: 59.1 mg/dL

## 2013-12-23 LAB — PROTEIN, URINE, 24 HOUR

## 2013-12-24 ENCOUNTER — Encounter: Payer: Self-pay | Admitting: Internal Medicine

## 2013-12-24 NOTE — Addendum Note (Signed)
Addended by: Crecencio Mc on: 12/24/2013 04:04 PM   Modules accepted: Orders, Medications

## 2013-12-27 ENCOUNTER — Encounter: Payer: Self-pay | Admitting: Internal Medicine

## 2013-12-27 DIAGNOSIS — R220 Localized swelling, mass and lump, head: Secondary | ICD-10-CM

## 2013-12-27 LAB — COMPLEMENT COMPONENT C1Q: Complement C1Q: 7.1 mg/dL (ref 5.0–8.6)

## 2014-01-02 ENCOUNTER — Encounter: Payer: Self-pay | Admitting: Emergency Medicine

## 2014-01-04 ENCOUNTER — Other Ambulatory Visit: Payer: Self-pay

## 2014-01-04 ENCOUNTER — Ambulatory Visit: Payer: Self-pay | Admitting: Internal Medicine

## 2014-01-04 ENCOUNTER — Other Ambulatory Visit (INDEPENDENT_AMBULATORY_CARE_PROVIDER_SITE_OTHER): Payer: Managed Care, Other (non HMO)

## 2014-01-04 DIAGNOSIS — R609 Edema, unspecified: Secondary | ICD-10-CM

## 2014-01-04 DIAGNOSIS — R220 Localized swelling, mass and lump, head: Secondary | ICD-10-CM

## 2014-01-05 ENCOUNTER — Telehealth: Payer: Self-pay | Admitting: Internal Medicine

## 2014-01-05 ENCOUNTER — Encounter: Payer: Self-pay | Admitting: Internal Medicine

## 2014-01-05 DIAGNOSIS — R220 Localized swelling, mass and lump, head: Secondary | ICD-10-CM

## 2014-01-05 NOTE — Assessment & Plan Note (Signed)
ECHO and CT of chest normal/neg for SVC syndrome or masses..  Urine negative for proteinuria.  rec rheum or allergist eval

## 2014-01-09 ENCOUNTER — Ambulatory Visit: Payer: Self-pay | Admitting: Otolaryngology

## 2014-01-09 ENCOUNTER — Telehealth: Payer: Self-pay | Admitting: Internal Medicine

## 2014-01-09 NOTE — Telephone Encounter (Signed)
Carla Mckinney is going to see a cardiologist about her facial swelling. She needs copies of my office visit notes, the labs, and the echo reports. Please print these out for her to pick up.

## 2014-01-09 NOTE — Telephone Encounter (Signed)
Everything printed placed up front for pick up patient notified.

## 2014-01-10 ENCOUNTER — Encounter: Payer: Self-pay | Admitting: Internal Medicine

## 2014-01-10 MED ORDER — METOPROLOL TARTRATE 50 MG PO TABS: 25.0000 mg | ORAL_TABLET | Freq: Two times a day (BID) | ORAL | Status: DC

## 2014-01-16 ENCOUNTER — Encounter: Payer: Self-pay | Admitting: Internal Medicine

## 2014-01-18 ENCOUNTER — Telehealth: Payer: Self-pay | Admitting: Internal Medicine

## 2014-01-18 DIAGNOSIS — R600 Localized edema: Secondary | ICD-10-CM

## 2014-01-18 MED ORDER — CETIRIZINE HCL 10 MG PO TABS: 10.0000 mg | ORAL_TABLET | Freq: Every day | ORAL | Status: DC

## 2014-01-18 MED ORDER — FAMOTIDINE 20 MG PO TABS: 20.0000 mg | ORAL_TABLET | Freq: Two times a day (BID) | ORAL | Status: DC

## 2014-01-18 MED ORDER — MONTELUKAST SODIUM 10 MG PO TABS: 10.0000 mg | ORAL_TABLET | Freq: Every day | ORAL | Status: DC

## 2014-01-18 NOTE — Telephone Encounter (Signed)
Left message for pt to return my call.

## 2014-01-18 NOTE — Telephone Encounter (Signed)
I talked with Dr Ubaldo Glassing today .  He agrees with me that you need to have [facility] look [facility] you since we have been unable to diagnoses the cause of your recurrent facial swelling with any of the tests that have been done thus far.  I am making a referral to [place] for the first available rheumatologist .   You will need to take the ECHO and CT images with you on a disk since [facility] is not on EPIC yet.   In the meantime, would like you to try taking the following medications to see if this is some kind of histamine mediated reaction : zyrtec 10 mg ( or allegra 180 mg if zyrtec makes you sleepy),  singulair 10 mg daily (rx I will call in) and famotidine 20 mg twice daily (generic pepcid,  i will call in ).

## 2014-01-19 NOTE — Telephone Encounter (Signed)
Left message for pt to return my call.

## 2014-01-22 NOTE — Telephone Encounter (Signed)
Unable to reach pt by phone, sent myChart message

## 2014-01-26 ENCOUNTER — Encounter: Payer: Self-pay | Admitting: Internal Medicine

## 2014-01-29 ENCOUNTER — Encounter: Payer: Self-pay | Admitting: Internal Medicine

## 2014-01-29 NOTE — Telephone Encounter (Signed)
Please advise 

## 2014-03-15 ENCOUNTER — Encounter: Payer: Self-pay | Admitting: Internal Medicine

## 2014-03-15 ENCOUNTER — Telehealth: Payer: Self-pay | Admitting: Internal Medicine

## 2014-03-15 NOTE — Telephone Encounter (Signed)
Can you Please give Ms Veale  the 4:15 slot on Tuesday?  And cancel the appt with raquel   thanks

## 2014-03-15 NOTE — Telephone Encounter (Signed)
The patient has been scheduled and notified.  °

## 2014-03-20 ENCOUNTER — Ambulatory Visit: Payer: Managed Care, Other (non HMO) | Admitting: Adult Health

## 2014-03-20 ENCOUNTER — Ambulatory Visit (INDEPENDENT_AMBULATORY_CARE_PROVIDER_SITE_OTHER): Payer: Managed Care, Other (non HMO) | Admitting: Internal Medicine

## 2014-03-20 ENCOUNTER — Encounter: Payer: Self-pay | Admitting: Internal Medicine

## 2014-03-20 VITALS — BP 130/88 | HR 68 | Temp 98.0°F | Resp 16 | Wt 160.8 lb

## 2014-03-20 DIAGNOSIS — R600 Localized edema: Secondary | ICD-10-CM

## 2014-03-20 DIAGNOSIS — E663 Overweight: Secondary | ICD-10-CM

## 2014-03-20 DIAGNOSIS — R609 Edema, unspecified: Secondary | ICD-10-CM

## 2014-03-20 MED ORDER — PHENTERMINE HCL 37.5 MG PO TBDP: 19.0000 mg | ORAL_TABLET | Freq: Two times a day (BID) | ORAL | Status: DC

## 2014-03-20 NOTE — Patient Instructions (Signed)
We are starting phentermine for your weight gain   Take 1/2 tablet in am and 2nd 1/2 by 2 or 3 pm to avoid insomnia  Goal is 8 lbs or more in 3 months   Get bp and pulse measured after one week, . Call if bP> 145/90 or pulse > 100   This is  One version of a  "Low GI"  Diet:  It will still lower your blood sugars and allow you to lose 4 to 8  lbs  per month if you follow it carefully.  Your goal with exercise is a minimum of 30 minutes of aerobic exercise 5 days per week (Walking does not count once it becomes easy!)    All of the foods can be found at grocery stores and in bulk at [employer].  The Atkins protein bars and shakes are available in more varieties at [employer], [employer] and [place].     7 AM Breakfast:  Choose from the following:  Low carbohydrate Protein  Shakes (I recommend the EAS AdvantEdge "Carb Control" shakes  Or the low carb shakes by Atkins.    2.5 carbs   Arnold's "Sandwhich Thin"toasted  w/ peanut butter (no jelly: about 20 net carbs  "Bagel Thin" with cream cheese and salmon: about 20 carbs   a scrambled egg/bacon/cheese burrito made with [place]'s "carb balance" whole wheat tortilla  (about 10 net carbs )   Avoid cereal and bananas, oatmeal and cream of wheat and grits. They are loaded with carbohydrates!   10 AM: high protein snack  Protein bar by Atkins (the snack size, under 200 cal, usually < 6 net carbs).    A stick of cheese:  Around 1 carb,  100 cal     Dannon Light n Fit [place] Yogurt  (80 cal, 8 carbs)  Other so called "protein bars" and Greek yogurts tend to be loaded with carbohydrates.  Remember, in food advertising, the word "energy" is synonymous for " carbohydrate."  Lunch:   A Sandwich using the bread choices listed, Can use any  Eggs,  lunchmeat, grilled meat or canned tuna), avocado, regular mayo/mustard  and cheese.  A Salad using blue cheese, ranch,  Goddess or vinagrette,  No croutons or "confetti" and no "candied nuts" but regular nuts OK.    No pretzels or chips.  Pickles and miniature sweet peppers are a good low carb alternative that provide a "crunch"  The bread is the only source of carbohydrate in a sandwich and  can be decreased by trying some of these alternatives to traditional loaf bread  Joseph's makes a pita bread and a flat bread that are 50 cal and 4 net carbs available at [place] and [place].  This can be toasted to use with hummous as well  Toufayan makes a low carb flatbread that's 100 cal and 9 net carbs available at [employer] and [employer]'s makes 2 sizes of  Low carb whole wheat tortilla  (The large one is 210 cal and 6 net carbs) Avoid "Low fat dressings, as well as Barry Brunner and [place] dressings They are loaded with sugar!   3 PM/ Mid day  Snack:  Consider  1 ounce of  almonds, walnuts, pistachios, pecans, peanuts,  Macadamia nuts or a nut medley.  Avoid "granola"; the dried cranberries and raisins are loaded with carbohydrates. Mixed nuts as long as there are no raisins,  cranberries or dried fruit.     6 PM  Dinner:  Meat/fowl/fish with a green salad, and either broccoli, cauliflower, green beans, spinach, brussel sprouts or  Lima beans. DO NOT BREAD THE PROTEIN!!      There is a low carb pasta by Dreamfield's that is acceptable and tastes great: only 5 digestible carbs/serving.( All grocery stores but BJs carry it )  Try Hurley Cisco Angelo's chicken piccata or chicken or eggplant parm over low carb pasta.(Lowes and BJs)   Marjory Lies Sanchez's "Carnitas" (pulled pork, no sauce,  0 carbs) or his beef pot roast to make a dinner burrito (at [employer]J's)  Pesto over low carb pasta (bj's sells a good quality pesto in the center refrigerated section of the deli   Whole wheat pasta is still full of digestible carbs and  Not as low in glycemic index as Dreamfield's.   Brown rice is still rice,  So skip the rice and noodles if you eat [place] or [place] (or at least limit to 1/2 cup)  9 PM snack :   Breyer's "low carb"  fudgsicle or  ice cream bar (Carb Smart line), or  Weight Watcher's ice cream bar , or another "no sugar added" ice cream;  a serving of fresh berries/cherries with whipped cream   Cheese or DANNON'S LlG[employer] [place] YOGURT  Avoid bananas, pineapple, grapes  and watermelon on a regular basis because they are high in sugar.  THINK OF THEM AS DESSERT  Remember that snack Substitutions should be less than 10 NET carbs per serving and meals < 20 carbs. Remember to subtract fiber grams to get the "net carbs."

## 2014-03-20 NOTE — Assessment & Plan Note (Signed)
I have addressed  BMI and recommended wt loss of 10% of body weigh over the next 6 months using a low glycemic index diet and regular exercise a minimum of 5 days per week. She has had difficulty losing weight due to increased appetite and is requesting a trial of  Phentermine.  She is aware of the possible side effects and risks and understands that    The medication will be discontinued if she has not lost 5% of her body weight over the next 3 months, which , based on today's weight is 8  lbs.

## 2014-03-20 NOTE — Progress Notes (Signed)
Patient ID: Carla Mckinney, female   DOB: 1961/05/20, 53 y.o.   MRN: [id number]  Patient Active Problem List   Diagnosis Date Noted  . Overweight (BMI 25.0-29.9) 03/20/2014  . Facial edema 12/21/2013  . Cyst of breast, left, benign solitary 03/23/2013  . Other and unspecified hyperlipidemia 02/24/2013    Subjective:  CC:   Chief Complaint  Patient presents with  . Follow-up    discuss weight gain    HPI:   Carla Mckinney is a 53 y.o. female who presents for Follow up on facial swelling of unclear etiology.  Her recurrent facial swelling has resolved spontaneously.  She has not taken any meds in a month. However, she has gained 20 lbs and has been unable to lose it on her own. She is requesting a prescribed medication to suppress her appetite.    Past Medical History  Diagnosis Date  . Hypertension     Past Surgical History  Procedure Laterality Date  . Tubal ligation    . Abdominal hysterectomy    . Oophorectomy      secondary to scar tissue,  [facility]  . Cardiac catheterization  2002    normal, due to syncope and abnormal myowiew ([facility])  . Tonsillectomy    . Mastopexy  2008       The following portions of the patient's history were reviewed and updated as appropriate: Allergies, current medications, and problem list.    Review of Systems:   Patient denies headache, fevers, malaise, unintentional weight loss, skin rash, eye pain, sinus congestion and sinus pain, sore throat, dysphagia,  hemoptysis , cough, dyspnea, wheezing, chest pain, palpitations, orthopnea, edema, abdominal pain, nausea, melena, diarrhea, constipation, flank pain, dysuria, hematuria, urinary  Frequency, nocturia, numbness, tingling, seizures,  Focal weakness, Loss of consciousness,  Tremor, insomnia, depression, anxiety, and suicidal ideation.     History   Social History  . Marital Status: Married    Spouse Name: N/A    Number of Children: N/A  . Years of Education: N/A   Occupational  History  . Not on file.   Social History Main Topics  . Smoking status: Never Smoker   . Smokeless tobacco: Never Used  . Alcohol Use: Yes  . Drug Use: No  . Sexual Activity: Not on file   Other Topics Concern  . Not on file   Social History Narrative  . No narrative on file    Objective:  Filed Vitals:   03/20/14 1624  BP: 130/88  Pulse: 68  Temp: 98 F (36.7 C)  Resp: 16     General appearance: alert, cooperative and appears stated age Ears: normal TM's and external ear canals both ears Throat: lips, mucosa, and tongue normal; teeth and gums normal Neck: no adenopathy, no carotid bruit, supple, symmetrical, trachea midline and thyroid not enlarged, symmetric, no tenderness/mass/nodules Back: symmetric, no curvature. ROM normal. No CVA tenderness. Lungs: clear to auscultation bilaterally Heart: regular rate and rhythm, S1, S2 normal, no murmur, click, rub or gallop Abdomen: soft, non-tender; bowel sounds normal; no masses,  no organomegaly Pulses: 2+ and symmetric Skin: Skin color, texture, turgor normal. No rashes or lesions Lymph nodes: Cervical, supraclavicular, and axillary nodes normal.  Assessment and Plan:  Overweight (BMI 25.0-29.9) I have addressed  BMI and recommended wt loss of 10% of body weigh over the next 6 months using a low glycemic index diet and regular exercise a minimum of 5 days per week. She has had difficulty losing weight due to  increased appetite and is requesting a trial of  Phentermine.  She is aware of the possible side effects and risks and understands that    The medication will be discontinued if she has not lost 5% of her body weight over the next 3 months, which , based on today's weight is 8  lbs.   Facial edema Resolved spontaneously, etiology unclear comprehensive workup included ECHO,  CT chest,  Serologies for complement deficiency all normal.    Updated Medication List Outpatient Encounter Prescriptions as of 03/20/2014   Medication Sig  . Phentermine HCl 37.5 MG TBDP Take 19 mg by mouth 2 (two) times daily with breakfast and lunch.  . [DISCONTINUED] cetirizine (ZYRTEC) 10 MG tablet Take 1 tablet (10 mg total) by mouth daily.  . [DISCONTINUED] estradiol (VIVELLE-DOT) 0.0375 MG/24HR Place 1 patch onto the skin 2 (two) times a week.  . [DISCONTINUED] famotidine (PEPCID) 20 MG tablet Take 1 tablet (20 mg total) by mouth 2 (two) times daily.  . [DISCONTINUED] metoprolol (LOPRESSOR) 50 MG tablet Take 0.5 tablets (25 mg total) by mouth 2 (two) times daily.  . [DISCONTINUED] montelukast (SINGULAIR) 10 MG tablet Take 1 tablet (10 mg total) by mouth at bedtime.  . [DISCONTINUED] Multiple Vitamin (MULTIVITAMIN) capsule Take 1 capsule by mouth daily.

## 2014-03-20 NOTE — Assessment & Plan Note (Addendum)
Resolved spontaneously, etiology unclear comprehensive workup included ECHO,  CT chest,  Serologies for complement deficiency all normal.

## 2014-03-20 NOTE — Progress Notes (Signed)
Pre-visit discussion using our clinic review tool. No additional management support is needed unless otherwise documented below in the visit note.  

## 2014-06-09 ENCOUNTER — Other Ambulatory Visit: Payer: Self-pay | Admitting: Internal Medicine

## 2014-07-12 ENCOUNTER — Other Ambulatory Visit: Payer: Self-pay | Admitting: Internal Medicine

## 2014-07-16 ENCOUNTER — Ambulatory Visit (INDEPENDENT_AMBULATORY_CARE_PROVIDER_SITE_OTHER): Payer: Managed Care, Other (non HMO) | Admitting: Internal Medicine

## 2014-07-16 ENCOUNTER — Ambulatory Visit: Payer: Self-pay | Admitting: Internal Medicine

## 2014-07-16 ENCOUNTER — Encounter: Payer: Self-pay | Admitting: Internal Medicine

## 2014-07-16 VITALS — BP 140/104 | HR 79 | Temp 99.1°F | Resp 18 | Ht 63.0 in | Wt 152.0 lb

## 2014-07-16 DIAGNOSIS — R609 Edema, unspecified: Secondary | ICD-10-CM

## 2014-07-16 DIAGNOSIS — I10 Essential (primary) hypertension: Secondary | ICD-10-CM

## 2014-07-16 DIAGNOSIS — E663 Overweight: Secondary | ICD-10-CM

## 2014-07-16 DIAGNOSIS — R600 Localized edema: Secondary | ICD-10-CM

## 2014-07-16 MED ORDER — METOPROLOL SUCCINATE ER 25 MG PO TB24
25.0000 mg | ORAL_TABLET | Freq: Every day | ORAL | Status: DC
Start: 2014-07-16 — End: 2014-07-18

## 2014-07-16 MED ORDER — PHENTERMINE HCL 37.5 MG PO TBDP: 19.0000 mg | ORAL_TABLET | Freq: Two times a day (BID) | ORAL | Status: DC

## 2014-07-16 NOTE — Progress Notes (Signed)
Patient ID: Carla Mckinney, female   DOB: 29-Aug-1961, 53 y.o.   MRN: [id number]   Patient Active Problem List   Diagnosis Date Noted  . Essential hypertension, benign 07/18/2014  . Overweight (BMI 25.0-29.9) 03/20/2014  . Facial edema 12/21/2013  . Cyst of breast, left, benign solitary 03/23/2013  . Other and unspecified hyperlipidemia 02/24/2013    Subjective:  CC:   Chief Complaint  Patient presents with  . Follow-up    medication refills    HPI:   Carla Mckinney is a 53 y.o. female who presents for ollow up on phentermine use, patient has lost  5% of body weight or 8 lbs since March when she started the medication ,  She reports that her BP has been fine at work.   However, her bp is  Elevated today in the office.  She denies  headache  , chest pain and sob. Her last dose of phentermine was yesterday  morning   Plan: return  in am for [facility].  Start Metoprolol  for bp 140/100 or higher     Past Medical History  Diagnosis Date  . Hypertension     Past Surgical History  Procedure Laterality Date  . Tubal ligation    . Abdominal hysterectomy    . Oophorectomy      secondary to scar tissue,  [facility]  . Cardiac catheterization  2002    normal, due to syncope and abnormal myowiew ([facility])  . Tonsillectomy    . Mastopexy  2008       The following portions of the patient's history were reviewed and updated as appropriate: Allergies, current medications, and problem list.    Review of Systems:   Patient denies headache, fevers, malaise, unintentional weight loss, skin rash, eye pain, sinus congestion and sinus pain, sore throat, dysphagia,  hemoptysis , cough, dyspnea, wheezing, chest pain, palpitations, orthopnea, edema, abdominal pain, nausea, melena, diarrhea, constipation, flank pain, dysuria, hematuria, urinary  Frequency, nocturia, numbness, tingling, seizures,  Focal weakness, Loss of consciousness,  Tremor, insomnia, depression, anxiety, and suicidal ideation.      History   Social History  . Marital Status: Married    Spouse Name: N/A    Number of Children: N/A  . Years of Education: N/A   Occupational History  . Not on file.   Social History Main Topics  . Smoking status: Never Smoker   . Smokeless tobacco: Never Used  . Alcohol Use: Yes  . Drug Use: No  . Sexual Activity: Not on file   Other Topics Concern  . Not on file   Social History Narrative  . No narrative on file    Objective:  Filed Vitals:   07/16/14 1509  BP: 140/104  Pulse: 79  Temp: 99.1 F (37.3 C)  Resp: 18     General appearance: alert, cooperative and appears stated age Ears: normal TM's and external ear canals both ears Throat: lips, mucosa, and tongue normal; teeth and gums normal Neck: no adenopathy, no carotid bruit, supple, symmetrical, trachea midline and thyroid not enlarged, symmetric, no tenderness/mass/nodules Back: symmetric, no curvature. ROM normal. No CVA tenderness. Lungs: clear to auscultation bilaterally Heart: regular rate and rhythm, S1, S2 normal, no murmur, click, rub or gallop Abdomen: soft, non-tender; bowel sounds normal; no masses,  no organomegaly Pulses: 2+ and symmetric Skin: Skin color, texture, turgor normal. No rashes or lesions Lymph nodes: Cervical, supraclavicular, and axillary nodes normal.  Assessment and Plan:  Facial edema Resolved spontaneously,  No cause found.   Overweight (BMI 25.0-29.9) 8 lb wt loss with phentermine x 3 months .  Med suspended due to elevated BP   Essential hypertension, benign May be secondary to phentermine, however she has persistent elevatins 48 hours after medication was dose.  Adding metoprolol XL 50 mg daily .phentermine has been suspended until BP is controlled.    Updated Medication List Outpatient Encounter Prescriptions as of 07/16/2014  Medication Sig  . Phentermine HCl 37.5 MG TBDP Take 19 mg by mouth 2 (two) times daily with breakfast and lunch.  . [DISCONTINUED]  Phentermine HCl 37.5 MG TBDP Take 19 mg by mouth 2 (two) times daily with breakfast and lunch.  . metoprolol succinate (TOPROL-XL) 25 MG 24 hr tablet Take 1 tablet (25 mg total) by mouth daily.  Marland Kitchen VIVELLE-DOT 0.0375 MG/24HR APPLY ONE PATCH EXTERNALLY TWICE WEEKLY      No orders of the defined types were placed in this encounter.    No Follow-up on file.

## 2014-07-16 NOTE — Progress Notes (Signed)
Pre-visit discussion using our clinic review tool. No additional management support is needed unless otherwise documented below in the visit note.  

## 2014-07-17 ENCOUNTER — Encounter: Payer: Self-pay | Admitting: Internal Medicine

## 2014-07-18 DIAGNOSIS — I10 Essential (primary) hypertension: Secondary | ICD-10-CM | POA: Insufficient documentation

## 2014-07-18 MED ORDER — METOPROLOL SUCCINATE ER 25 MG PO TB24: 50.0000 mg | ORAL_TABLET | Freq: Every day | ORAL | Status: DC

## 2014-07-18 NOTE — Assessment & Plan Note (Signed)
May be secondary to phentermine, however she has persistent elevatins 48 hours after medication was dose.  Adding metoprolol XL 50 mg daily .phentermine has been suspended until BP is controlled.

## 2014-07-18 NOTE — Assessment & Plan Note (Signed)
8 lb wt loss with phentermine x 3 months .  Med suspended due to elevated BP

## 2014-07-18 NOTE — Assessment & Plan Note (Signed)
Resolved spontaneously,  No cause found.

## 2014-08-03 ENCOUNTER — Encounter: Payer: Self-pay | Admitting: Internal Medicine

## 2014-08-04 ENCOUNTER — Other Ambulatory Visit: Payer: Self-pay | Admitting: Internal Medicine

## 2014-08-04 DIAGNOSIS — I10 Essential (primary) hypertension: Secondary | ICD-10-CM

## 2014-08-04 MED ORDER — METOPROLOL SUCCINATE ER 50 MG PO TB24: 50.0000 mg | ORAL_TABLET | Freq: Every day | ORAL | Status: DC

## 2014-10-22 ENCOUNTER — Encounter: Payer: Self-pay | Admitting: Internal Medicine

## 2014-10-29 ENCOUNTER — Other Ambulatory Visit: Payer: Self-pay | Admitting: Internal Medicine

## 2014-11-16 IMAGING — CR DG CHEST 1V
1 series · 1 of 1 positions shown · non-contrast
Comparison: 12/11/2013

CLINICAL DATA: Abnormal chest radiograph

EXAM:
CHEST - 1 VIEW

[w chest pa]
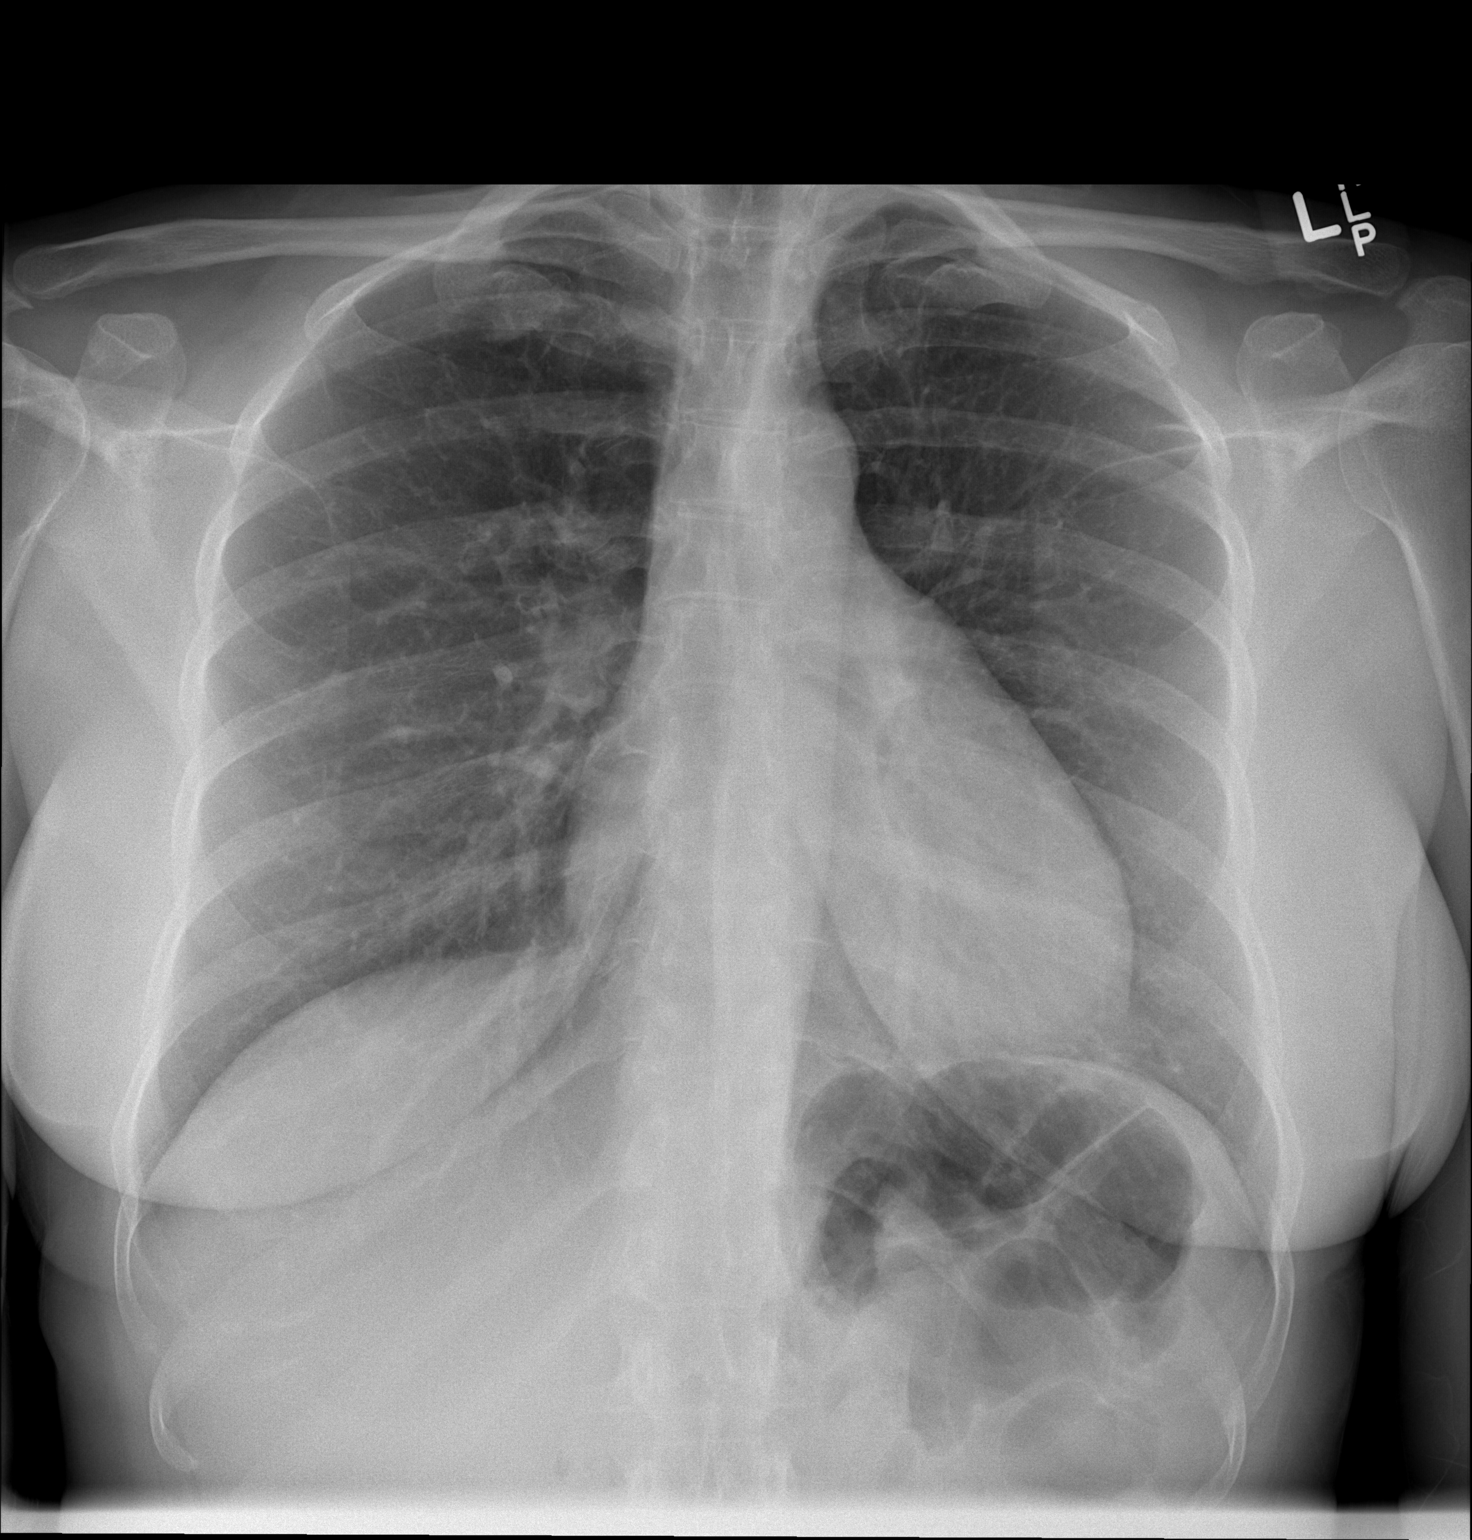

[1 of 1 positions shown; findings below may reference images not displayed]

FINDINGS: Grossly unchanged cardiac silhouette and mediastinal contours given
lordotic projection. Previously questioned opacity within the left
lung apex is no longer demonstrated. The lungs remain hyperexpanded
with mild diffuse slightly nodular thickening of the pulmonary
interstitium. No focal airspace opacities. No pleural effusion or
pneumothorax. No evidence of edema. Unchanged bones.
IMPRESSION: 1. Apparent resolution of asymmetric left upper lung opacity which
as such was favored to have been artifactual secondary to the
confluence overlying osseous and soft tissue structures.
2. Mild lung hyperexpansion and bronchitic change without acute
cardiopulmonary disease

## 2014-11-29 ENCOUNTER — Other Ambulatory Visit: Payer: Self-pay | Admitting: Internal Medicine

## 2014-11-30 ENCOUNTER — Telehealth: Payer: Self-pay | Admitting: Internal Medicine

## 2014-11-30 MED ORDER — FUROSEMIDE 20 MG PO TABS: 20.0000 mg | ORAL_TABLET | Freq: Every day | ORAL | Status: DC | PRN

## 2014-11-30 NOTE — Telephone Encounter (Signed)
Sent mychart with question, med not on current list

## 2014-11-30 NOTE — Telephone Encounter (Signed)
Ok to refill,  Refill sent  

## 2014-11-30 NOTE — Telephone Encounter (Signed)
See patient's mychart message, ok to refill?

## 2015-01-02 ENCOUNTER — Encounter: Payer: Self-pay | Admitting: Internal Medicine

## 2015-03-20 ENCOUNTER — Ambulatory Visit: Payer: Self-pay | Admitting: Internal Medicine

## 2015-03-20 LAB — HM MAMMOGRAPHY: HM Mammogram: NORMAL

## 2015-03-21 ENCOUNTER — Encounter: Payer: Self-pay | Admitting: Internal Medicine

## 2015-04-16 ENCOUNTER — Encounter: Payer: Self-pay | Admitting: Internal Medicine

## 2015-05-19 ENCOUNTER — Other Ambulatory Visit: Payer: Self-pay | Admitting: Internal Medicine

## 2015-06-13 ENCOUNTER — Ambulatory Visit: Payer: Managed Care, Other (non HMO) | Admitting: Internal Medicine

## 2015-06-19 ENCOUNTER — Ambulatory Visit (INDEPENDENT_AMBULATORY_CARE_PROVIDER_SITE_OTHER): Payer: 59 | Admitting: Internal Medicine

## 2015-06-19 ENCOUNTER — Encounter: Payer: Self-pay | Admitting: Internal Medicine

## 2015-06-19 VITALS — BP 120/80 | HR 79 | Temp 98.5°F | Resp 16 | Ht 63.0 in | Wt 167.5 lb

## 2015-06-19 DIAGNOSIS — E785 Hyperlipidemia, unspecified: Secondary | ICD-10-CM | POA: Diagnosis not present

## 2015-06-19 DIAGNOSIS — E663 Overweight: Secondary | ICD-10-CM | POA: Diagnosis not present

## 2015-06-19 DIAGNOSIS — R5382 Chronic fatigue, unspecified: Secondary | ICD-10-CM | POA: Diagnosis not present

## 2015-06-19 DIAGNOSIS — I10 Essential (primary) hypertension: Secondary | ICD-10-CM | POA: Diagnosis not present

## 2015-06-19 LAB — COMPREHENSIVE METABOLIC PANEL
ALT: 17 U/L (ref 0–35)
AST: 19 U/L (ref 0–37)
Albumin: 4.4 g/dL (ref 3.5–5.2)
Alkaline Phosphatase: 89 U/L (ref 39–117)
BILIRUBIN TOTAL: 0.6 mg/dL (ref 0.2–1.2)
BUN: 22 mg/dL (ref 6–23)
CALCIUM: 10.4 mg/dL (ref 8.4–10.5)
CHLORIDE: 103 meq/L (ref 96–112)
CO2: 29 meq/L (ref 19–32)
Creatinine, Ser: 0.67 mg/dL (ref 0.40–1.20)
GFR: 98.41 mL/min (ref >60.00)
GLUCOSE: 88 mg/dL (ref 70–99)
Potassium: 4.9 mEq/L (ref 3.5–5.1)
Sodium: 139 mEq/L (ref 135–145)
Total Protein: 7.1 g/dL (ref 6.0–8.3)

## 2015-06-19 LAB — CBC WITH DIFFERENTIAL/PLATELET
BASOS PCT: 0.4 % (ref 0.0–3.0)
Basophils Absolute: 0 10*3/uL (ref 0.0–0.1)
Eosinophils Absolute: 0.1 10*3/uL (ref 0.0–0.7)
Eosinophils Relative: 1.6 % (ref 0.0–5.0)
HEMATOCRIT: 39.4 % (ref 36.0–46.0)
HEMOGLOBIN: 13.3 g/dL (ref 12.0–15.0)
LYMPHS ABS: 2.1 10*3/uL (ref 0.7–4.0)
Lymphocytes Relative: 28.6 % (ref 12.0–46.0)
MCHC: 33.7 g/dL (ref 30.0–36.0)
MCV: 91.8 fl (ref 78.0–100.0)
MONOS PCT: 8.1 % (ref 3.0–12.0)
Monocytes Absolute: 0.6 10*3/uL (ref 0.1–1.0)
NEUTROS ABS: 4.6 10*3/uL (ref 1.4–7.7)
Neutrophils Relative %: 61.3 % (ref 43.0–77.0)
Platelets: 245 10*3/uL (ref 150.0–400.0)
RBC: 4.29 Mil/uL (ref 3.87–5.11)
RDW: 13.7 % (ref 11.5–15.5)
WBC: 7.5 10*3/uL (ref 4.0–10.5)

## 2015-06-19 LAB — LIPID PANEL
CHOLESTEROL: 225 mg/dL — AB (ref 0–200)
HDL: 68 mg/dL (ref >39.00)
LDL Cholesterol: 142 mg/dL — ABNORMAL HIGH (ref 0–99)
NONHDL: 157
TRIGLYCERIDES: 73 mg/dL (ref 0.0–149.0)
Total CHOL/HDL Ratio: 3
VLDL: 14.6 mg/dL (ref 0.0–40.0)

## 2015-06-19 LAB — TSH: TSH: 1.53 u[IU]/mL (ref 0.35–4.50)

## 2015-06-19 MED ORDER — PHENTERMINE HCL 37.5 MG PO TBDP: 19.0000 mg | ORAL_TABLET | Freq: Two times a day (BID) | ORAL | Status: DC

## 2015-06-19 MED ORDER — METOPROLOL SUCCINATE ER 50 MG PO TB24: 50.0000 mg | ORAL_TABLET | Freq: Every day | ORAL | Status: DC

## 2015-06-19 NOTE — Progress Notes (Signed)
Pre-visit discussion using our clinic review tool. No additional management support is needed unless otherwise documented below in the visit note.  

## 2015-06-19 NOTE — Patient Instructions (Signed)
I am changing your metoprolol to Toprol XL  It is once daily.   I have refilled your phentermine ,  please let me know when you decide to start it

## 2015-06-21 ENCOUNTER — Encounter: Payer: Self-pay | Admitting: Internal Medicine

## 2015-06-22 NOTE — Assessment & Plan Note (Signed)
Mild, LDL 142. Her risk factors include hypertension and a family history of CAD . However her mother's early coronary artery disease was  likely accelerated by herlifelong  tobacco abuse. As long as her HDL remains high at 68 I see no reason to start her on statin therapy.  Lab Results  Component Value Date   CHOL 225* 06/19/2015   HDL 68.00 06/19/2015   LDLCALC 142* 06/19/2015   LDLDIRECT 154.1 02/23/2013   TRIG 73.0 06/19/2015   CHOLHDL 3 06/19/2015

## 2015-06-22 NOTE — Progress Notes (Signed)
Subjective:  Patient ID: Carla Mckinney, female    DOB: July 15, 1961  Age: 54 y.o. MRN: [id number]  CC: The primary encounter diagnosis was Essential hypertension, benign. Diagnoses of Overweight (BMI 25.0-29.9), Chronic fatigue, and Hyperlipidemia with target LDL less than 100 were also pertinent to this visit.  HPI Carla Mckinney presents for follow up hypertension and overweight.  Since her las visit her mother Thayer Jew passed away in Hospice after recurrent admissions for AMI and respiratory failure.  She is managing  her grief well, and functioning well /sleeping well.  She has gained weight due to multiple prolonged hospital vigils and would like to resume a focused weight loss program an dis requesting a refill on phentermine, which helped her last year .    Meds reviewed,  Sh has been taking Lopressor once daily due to an error in refills.  She is tolerating the medication without adverse effects.                    Outpatient Prescriptions Prior to Visit  Medication Sig Dispense Refill  . furosemide (LASIX) 20 MG tablet Take 1 tablet (20 mg total) by mouth daily as needed. 30 tablet 1  . metoprolol (LOPRESSOR) 50 MG tablet TAKE ONE TABLET BY MOUTH ONE TIME DAILY 90 tablet 0  . VIVELLE-DOT 0.0375 MG/24HR APPLY ONE PATCH EXTERNALLY TWICE WEEKLY  (Patient not taking: Reported on 06/19/2015) 8 patch 5  . metoprolol succinate (TOPROL-XL) 50 MG 24 hr tablet Take 1 tablet (50 mg total) by mouth daily. (Patient not taking: Reported on 06/19/2015) 180 tablet 3  . Phentermine HCl 37.5 MG TBDP Take 19 mg by mouth 2 (two) times daily with breakfast and lunch. (Patient not taking: Reported on 06/19/2015) 30 tablet 2   No facility-administered medications prior to visit.    Review of Systems;  Patient denies headache, fevers, malaise, unintentional weight loss, skin rash, eye pain, sinus congestion and sinus pain, sore throat, dysphagia,  hemoptysis , cough, dyspnea, wheezing, chest pain,  palpitations, orthopnea, edema, abdominal pain, nausea, melena, diarrhea, constipation, flank pain, dysuria, hematuria, urinary  Frequency, nocturia, numbness, tingling, seizures,  Focal weakness, Loss of consciousness,  Tremor, insomnia, depression, anxiety, and suicidal ideation.      Objective:  BP 120/80 mmHg  Pulse 79  Temp(Src) 98.5 F (36.9 C) (Oral)  Resp 16  Ht 5\' 3"  (1.6 m)  Wt 167 lb 8 oz (75.978 kg)  BMI 29.68 kg/m2  SpO2 99%  BP Readings from Last 3 Encounters:  06/19/15 120/80  07/16/14 140/104  03/20/14 130/88    Wt Readings from Last 3 Encounters:  06/19/15 167 lb 8 oz (75.978 kg)  07/16/14 152 lb (68.947 kg)  03/20/14 160 lb 12 oz (72.916 kg)    General appearance: alert, cooperative and appears stated age Ears: normal TM's and external ear canals both ears Throat: lips, mucosa, and tongue normal; teeth and gums normal Neck: no adenopathy, no carotid bruit, supple, symmetrical, trachea midline and thyroid not enlarged, symmetric, no tenderness/mass/nodules Back: symmetric, no curvature. ROM normal. No CVA tenderness. Lungs: clear to auscultation bilaterally Heart: regular rate and rhythm, S1, S2 normal, no murmur, click, rub or gallop Abdomen: soft, non-tender; bowel sounds normal; no masses,  no organomegaly Pulses: 2+ and symmetric Skin: Skin color, texture, turgor normal. No rashes or lesions Lymph nodes: Cervical, supraclavicular, and axillary nodes normal.  No results found for: HGBA1C  Lab Results  Component Value Date   CREATININE 0.67 06/19/2015  CREATININE 0.6 02/23/2013   CREATININE 0.5 06/15/2012    Lab Results  Component Value Date   WBC 7.5 06/19/2015   HGB 13.3 06/19/2015   HCT 39.4 06/19/2015   PLT 245.0 06/19/2015   GLUCOSE 88 06/19/2015   CHOL 225* 06/19/2015   TRIG 73.0 06/19/2015   HDL 68.00 06/19/2015   LDLDIRECT 154.1 02/23/2013   LDLCALC 142* 06/19/2015   ALT 17 06/19/2015   AST 19 06/19/2015   NA 139 06/19/2015    K 4.9 06/19/2015   CL 103 06/19/2015   CREATININE 0.67 06/19/2015   BUN 22 06/19/2015   CO2 29 06/19/2015   TSH 1.53 06/19/2015    No results found.  Assessment & Plan:   Problem List Items Addressed This Visit      Unprioritized   Hyperlipidemia with target LDL less than 100    Mild, LDL 142. Her risk factors include hypertension and a family history of CAD . However her mother's early coronary artery disease was  likely accelerated by herlifelong  tobacco abuse. As long as her HDL remains high at 68 I see no reason to start her on statin therapy.  Lab Results  Component Value Date   CHOL 225* 06/19/2015   HDL 68.00 06/19/2015   LDLCALC 142* 06/19/2015   LDLDIRECT 154.1 02/23/2013   TRIG 73.0 06/19/2015   CHOLHDL 3 06/19/2015           Relevant Medications   metoprolol succinate (TOPROL-XL) 50 MG 24 hr tablet   Overweight (BMI 25.0-29.9)    Historically she achieved an 8 lb wt loss with phentermine over  3 months, but the medication was  suspended due to elevated BP .  She will resume phentermine and have VS rechecked in one week         Relevant Orders   TSH (Completed)   Lipid panel (Completed)   Essential hypertension, benign - Primary    Well controlled on current regimen. Renal function stable, changing lopressor to Toprol XL.    Lab Results  Component Value Date   CREATININE 0.67 06/19/2015   Lab Results  Component Value Date   NA 139 06/19/2015   K 4.9 06/19/2015   CL 103 06/19/2015   CO2 29 06/19/2015         Relevant Medications   metoprolol succinate (TOPROL-XL) 50 MG 24 hr tablet   Other Relevant Orders   Comprehensive metabolic panel (Completed)    Other Visit Diagnoses    Chronic fatigue        Relevant Orders    CBC with Differential/Platelet (Completed)       I have discontinued Ms. Cooler's metoprolol. I am also having her maintain her VIVELLE-DOT, furosemide, metoprolol succinate, and Phentermine HCl.  Meds ordered this  encounter  Medications  . metoprolol succinate (TOPROL-XL) 50 MG 24 hr tablet    Sig: Take 1 tablet (50 mg total) by mouth daily.    Dispense:  90 tablet    Refill:  3  . Phentermine HCl 37.5 MG TBDP    Sig: Take 19 mg by mouth 2 (two) times daily with breakfast and lunch.    Dispense:  30 tablet    Refill:  2    Medications Discontinued During This Encounter  Medication Reason  . metoprolol succinate (TOPROL-XL) 50 MG 24 hr tablet Change in therapy  . metoprolol (LOPRESSOR) 50 MG tablet   . Phentermine HCl 37.5 MG TBDP Reorder   A total of 25 minutes of  face to face time was spent with patient more than half of which was spent in counselling about the above mentioned conditions  and coordination of care  Follow-up: No Follow-up on file.   Crecencio Mc, MD

## 2015-06-22 NOTE — Assessment & Plan Note (Signed)
Well controlled on current regimen. Renal function stable, changing lopressor to Toprol XL.    Lab Results  Component Value Date   CREATININE 0.67 06/19/2015   Lab Results  Component Value Date   NA 139 06/19/2015   K 4.9 06/19/2015   CL 103 06/19/2015   CO2 29 06/19/2015

## 2015-06-22 NOTE — Assessment & Plan Note (Signed)
Historically she achieved an 8 lb wt loss with phentermine over  3 months, but the medication was  suspended due to elevated BP .  She will resume phentermine and have VS rechecked in one week

## 2015-06-24 ENCOUNTER — Encounter: Payer: Self-pay | Admitting: Internal Medicine

## 2015-07-09 ENCOUNTER — Encounter: Payer: Self-pay | Admitting: Internal Medicine

## 2015-07-10 ENCOUNTER — Other Ambulatory Visit: Payer: Self-pay

## 2015-07-10 DIAGNOSIS — I10 Essential (primary) hypertension: Secondary | ICD-10-CM

## 2015-07-10 MED ORDER — METOPROLOL SUCCINATE ER 50 MG PO TB24
50.0000 mg | ORAL_TABLET | Freq: Every day | ORAL | Status: DC
Start: 2015-07-10 — End: 2015-07-16

## 2015-07-16 ENCOUNTER — Encounter: Payer: Self-pay | Admitting: Internal Medicine

## 2015-07-16 ENCOUNTER — Other Ambulatory Visit: Payer: Self-pay | Admitting: *Deleted

## 2015-07-16 DIAGNOSIS — I10 Essential (primary) hypertension: Secondary | ICD-10-CM

## 2015-07-16 MED ORDER — METOPROLOL SUCCINATE ER 50 MG PO TB24
50.0000 mg | ORAL_TABLET | Freq: Every day | ORAL | Status: DC
Start: 2015-07-16 — End: 2016-07-24

## 2015-08-02 ENCOUNTER — Ambulatory Visit: Payer: 59 | Admitting: *Deleted

## 2015-08-02 ENCOUNTER — Encounter: Payer: Self-pay | Admitting: Internal Medicine

## 2015-08-02 VITALS — BP 138/80 | HR 59

## 2015-08-02 DIAGNOSIS — R634 Abnormal weight loss: Secondary | ICD-10-CM

## 2015-08-02 NOTE — Telephone Encounter (Signed)
Saw patient in office nurse visit

## 2015-08-02 NOTE — Progress Notes (Signed)
Patient presented in office through My chart message starting Phentermine for weight loss wanted to start today patient came in for initial bP check. 138/80 HR 59 . Patient advised to have rechecked after one week on phentermine and to schedule follow up with MD in 3 months.

## 2015-12-11 ENCOUNTER — Other Ambulatory Visit: Payer: Self-pay | Admitting: Internal Medicine

## 2015-12-11 NOTE — Telephone Encounter (Signed)
Refill for 30 days only, .  Controlled substance (phentermine) Has not been seen since she started the medication in August/   Needs Jan appt.

## 2015-12-12 NOTE — Telephone Encounter (Signed)
Patient notified

## 2015-12-13 ENCOUNTER — Other Ambulatory Visit: Payer: Self-pay | Admitting: Internal Medicine

## 2015-12-13 NOTE — Telephone Encounter (Signed)
Yes,  Denied.  Patient needs to be seen

## 2015-12-13 NOTE — Telephone Encounter (Signed)
Medication was refilled on 12/11/15 are you okay with refusing?

## 2015-12-19 NOTE — Telephone Encounter (Signed)
Refill sent for 30 days. 

## 2015-12-31 ENCOUNTER — Encounter: Payer: Self-pay | Admitting: Internal Medicine

## 2015-12-31 ENCOUNTER — Other Ambulatory Visit: Payer: Self-pay | Admitting: Internal Medicine

## 2015-12-31 MED ORDER — PHENTERMINE HCL 37.5 MG PO TABS: ORAL_TABLET | ORAL | Status: DC

## 2015-12-31 NOTE — Telephone Encounter (Signed)
Please advise patient has not had an OV since 6/16 patient however did come into office for BP to start phentermine in August 2016, clinical note done 08/02/15. For phentermine but looks like last script as 6/16? Patient last fill for phentermine BY the narcotic web site was 10/07/15. From 8/16

## 2015-12-31 NOTE — Telephone Encounter (Signed)
Do you have this prescription?  

## 2016-05-15 ENCOUNTER — Other Ambulatory Visit: Payer: Self-pay | Admitting: *Deleted

## 2016-05-15 ENCOUNTER — Encounter: Payer: BLUE CROSS/BLUE SHIELD | Admitting: Internal Medicine

## 2016-05-15 ENCOUNTER — Ambulatory Visit (INDEPENDENT_AMBULATORY_CARE_PROVIDER_SITE_OTHER): Payer: BLUE CROSS/BLUE SHIELD | Admitting: *Deleted

## 2016-05-15 DIAGNOSIS — I10 Essential (primary) hypertension: Secondary | ICD-10-CM

## 2016-05-15 DIAGNOSIS — E785 Hyperlipidemia, unspecified: Secondary | ICD-10-CM | POA: Diagnosis not present

## 2016-05-15 LAB — CBC WITH DIFFERENTIAL/PLATELET
BASOS PCT: 0.6 % (ref 0.0–3.0)
Basophils Absolute: 0 10*3/uL (ref 0.0–0.1)
EOS ABS: 0.1 10*3/uL (ref 0.0–0.7)
Eosinophils Relative: 1.4 % (ref 0.0–5.0)
HEMATOCRIT: 41.2 % (ref 36.0–46.0)
HEMOGLOBIN: 13.8 g/dL (ref 12.0–15.0)
LYMPHS PCT: 28.7 % (ref 12.0–46.0)
Lymphs Abs: 1.7 10*3/uL (ref 0.7–4.0)
MCHC: 33.6 g/dL (ref 30.0–36.0)
MCV: 91.1 fl (ref 78.0–100.0)
Monocytes Absolute: 0.4 10*3/uL (ref 0.1–1.0)
Monocytes Relative: 7.1 % (ref 3.0–12.0)
Neutro Abs: 3.6 10*3/uL (ref 1.4–7.7)
Neutrophils Relative %: 62.2 % (ref 43.0–77.0)
Platelets: 273 10*3/uL (ref 150.0–400.0)
RBC: 4.52 Mil/uL (ref 3.87–5.11)
RDW: 12.6 % (ref 11.5–15.5)
WBC: 5.8 10*3/uL (ref 4.0–10.5)

## 2016-05-15 LAB — LIPID PANEL
Cholesterol: 233 mg/dL — ABNORMAL HIGH (ref 0–200)
HDL: 62.5 mg/dL (ref >39.00)
LDL Cholesterol: 159 mg/dL — ABNORMAL HIGH (ref 0–99)
NONHDL: 170.52
Total CHOL/HDL Ratio: 4
Triglycerides: 57 mg/dL (ref 0.0–149.0)
VLDL: 11.4 mg/dL (ref 0.0–40.0)

## 2016-05-15 LAB — COMPREHENSIVE METABOLIC PANEL
ALK PHOS: 87 U/L (ref 39–117)
ALT: 16 U/L (ref 0–35)
AST: 17 U/L (ref 0–37)
Albumin: 4.5 g/dL (ref 3.5–5.2)
BILIRUBIN TOTAL: 1 mg/dL (ref 0.2–1.2)
BUN: 11 mg/dL (ref 6–23)
CO2: 29 mEq/L (ref 19–32)
CREATININE: 0.67 mg/dL (ref 0.40–1.20)
Calcium: 10.6 mg/dL — ABNORMAL HIGH (ref 8.4–10.5)
Chloride: 105 mEq/L (ref 96–112)
GFR: 98.07 mL/min (ref >60.00)
GLUCOSE: 92 mg/dL (ref 70–99)
Potassium: 4.5 mEq/L (ref 3.5–5.1)
SODIUM: 140 meq/L (ref 135–145)
TOTAL PROTEIN: 6.8 g/dL (ref 6.0–8.3)

## 2016-05-15 LAB — CHOLESTEROL, TOTAL: CHOLESTEROL: 237 mg/dL — AB (ref 0–200)

## 2016-05-15 LAB — TSH: TSH: 2.45 u[IU]/mL (ref 0.35–4.50)

## 2016-05-18 ENCOUNTER — Encounter: Payer: Self-pay | Admitting: Internal Medicine

## 2016-05-19 ENCOUNTER — Ambulatory Visit (INDEPENDENT_AMBULATORY_CARE_PROVIDER_SITE_OTHER): Payer: BLUE CROSS/BLUE SHIELD | Admitting: Internal Medicine

## 2016-05-19 ENCOUNTER — Encounter: Payer: Self-pay | Admitting: Internal Medicine

## 2016-05-19 VITALS — BP 120/84 | Temp 97.8°F | Ht 63.0 in | Wt 167.0 lb

## 2016-05-19 DIAGNOSIS — Z0000 Encounter for general adult medical examination without abnormal findings: Secondary | ICD-10-CM | POA: Insufficient documentation

## 2016-05-19 DIAGNOSIS — I10 Essential (primary) hypertension: Secondary | ICD-10-CM | POA: Diagnosis not present

## 2016-05-19 DIAGNOSIS — E785 Hyperlipidemia, unspecified: Secondary | ICD-10-CM | POA: Diagnosis not present

## 2016-05-19 DIAGNOSIS — Z1239 Encounter for other screening for malignant neoplasm of breast: Secondary | ICD-10-CM | POA: Diagnosis not present

## 2016-05-19 MED ORDER — FUROSEMIDE 20 MG PO TABS: 20.0000 mg | ORAL_TABLET | Freq: Every day | ORAL | Status: DC | PRN

## 2016-05-19 NOTE — Assessment & Plan Note (Signed)
Based on current lipid profile, the risk of clinically significant CAD is 6 % over the next 10 years, using the Framingham risk calculator. The [employer] recommends starting patients aged 53 or higher on moderate intensity statin therapy for LDL between 70-189 and 10 yr risk of CAD > 7.5% ; she does not meet criteria for statin use. Marland Kitchen

## 2016-05-19 NOTE — Assessment & Plan Note (Signed)
.  Well controlled on current regimen. Renal function stable, no changes today.  Lab Results  Component Value Date   CREATININE 0.67 05/15/2016   Lab Results  Component Value Date   NA 140 05/15/2016   K 4.5 05/15/2016   CL 105 05/15/2016   CO2 29 05/15/2016

## 2016-05-19 NOTE — Patient Instructions (Signed)
The  diet I discussed with you today is the 10 day Green Smoothie Cleansing /Detox Diet by Linden Dolin . available on [place] for around $10.  It does require a blender, (Vita Mix, a electric juicer,  Or a Nutribullet Rx).  This is not a low carb or a weight loss diet,  It is fundamentally a "cleansing" low fat diet that eliminates sugar, gluten, caffeine, alcohol and dairy for 10 days .  What you add back after the initial ten days is entirely up to  you!  You can expect to lose 5 to 10 lbs depending on how strict you are.   I found that  drinking 2 smoothies or juices  daily and keeping one chewable meal (but keep it simple, like baked fish and salad, rice or bok choy) kept me satisfied and kept me from straying  .  You snack primarily on fresh  fruit, egg whites and judicious quantities of nuts.  You can add a  vegetable based protein powder  to any smoothie made with almond milk (nothing with whey , since whey is dairy)  [employer] has a few but  the [employer] has the greatest  selection .  Using frozen fruits is much more convenient and cost effective. You can even find plenty of organic fruit in the frozen fruit section of [employer].  Just thaw what you need for the following day the night before in the refrigerator (to avoid jamming up your machine)   The organic vegan protein powder I tried  is called Vega" and I found it at [employer] .  It is sugar free. Tastes like crap.  My advice:  Sarina Ser your protein  (eat an egg or two in the am with your smoothie or add soy yogurt for protein ) ,  Don't ruin the taste of your smoothies with protein powder unless you can find one you really love.

## 2016-05-19 NOTE — Assessment & Plan Note (Signed)
Annual comprehensive preventive exam was done as well as an evaluation and management of chronic conditions .  During the course of the visit the patient was educated and counseled about appropriate screening and preventive services including :  diabetes screening, lipid analysis with projected  10 year  risk for CAD , nutrition counseling, breast, cervical and colorectal cancer screening, and recommended immunizations.  Printed recommendations for health maintenance screenings was give 

## 2016-05-19 NOTE — Progress Notes (Signed)
Patient ID: Carla Mckinney, female    DOB: Aug 06, 1961  Age: 55 y.o. MRN: [id number]  The patient is here for annual  wellness examination and management of other chronic and acute problems.  Last mammogram over a year ago . History of cyst resolved on its own TAH/ LSO  .  Remotely no trouble with menopause except occasional hot flahses,   The risk factors are reflected in the social history.  The roster of all physicians providing medical care to patient - is listed in the Snapshot section of the chart.  Home safety : The patient has smoke detectors in the home. They wear seatbelts.  There are no firearms at home. There is no violence in the home.   There is no risks for hepatitis, STDs or HIV. There is no   history of blood transfusion. They have no travel history to infectious disease endemic areas of the world.  The patient has seen their dentist in the last six month. They have seen their eye doctor in the last year. They admit to slight hearing difficulty with regard to whispered voices and some television programs.  They have deferred audiologic testing in the last year.  They do not  have excessive sun exposure. Discussed the need for sun protection: hats, long sleeves and use of sunscreen if there is significant sun exposure.   Diet: the importance of a healthy diet is discussed. They do have a healthy diet.  The benefits of regular aerobic exercise were discussed. She walks 4 times per week ,  20 minutes.   Depression screen: there are no signs or vegative symptoms of depression- irritability, change in appetite, anhedonia, sadness/tearfullness.  The following portions of the patient's history were reviewed and updated as appropriate: allergies, current medications, past family history, past medical history,  past surgical history, past social history  and problem list.  Visual acuity was not assessed per patient preference since she has regular follow up with her ophthalmologist.  Hearing and body mass index were assessed and reviewed.   During the course of the visit the patient was educated and counseled about appropriate screening and preventive services including : fall prevention , diabetes screening, nutrition counseling, colorectal cancer screening, and recommended immunizations.    CC: The primary encounter diagnosis was Breast cancer screening. Diagnoses of Essential hypertension, benign, Hyperlipidemia with target LDL less than 100, and Encounter for preventive health examination were also pertinent to this visit.  Has been workiing from home for the past 1.5 yrs  keeping a schedule Walking for exercise  3 to 5 days per week Going to start a hiking program with husband,  Weekend trips   History Carla Mckinney has a past medical history of Hypertension.   She has past surgical history that includes Tubal ligation; Abdominal hysterectomy; Oophorectomy; Cardiac catheterization (2002); Tonsillectomy; and Mastopexy (2008).   Her family history includes Cancer in her paternal grandfather; Heart disease in her maternal grandfather, mother, and paternal grandmother; Heart failure in her mother; Hyperlipidemia in her father; Hypertension in her father; Stroke (age of onset: 8) in her paternal grandmother.She reports that she has never smoked. She has never used smokeless tobacco. She reports that she drinks alcohol. She reports that she does not use illicit drugs.  Outpatient Prescriptions Prior to Visit  Medication Sig Dispense Refill  . metoprolol succinate (TOPROL-XL) 50 MG 24 hr tablet Take 1 tablet (50 mg total) by mouth daily. 90 tablet 3  . furosemide (LASIX) 20 MG tablet Take  1 tablet (20 mg total) by mouth daily as needed. 30 tablet 1  . phentermine (ADIPEX-P) 37.5 MG tablet TAKE 1/2 TABLET BY MOUTH TWICE A DAY W/ BREAKFAST AND LUNCH 30 tablet 0  . VIVELLE-DOT 0.0375 MG/24HR APPLY ONE PATCH EXTERNALLY TWICE WEEKLY  (Patient not taking: Reported on 06/19/2015) 8 patch 5    No facility-administered medications prior to visit.    Review of Systems   Patient denies headache, fevers, malaise, unintentional weight loss, skin rash, eye pain, sinus congestion and sinus pain, sore throat, dysphagia,  hemoptysis , cough, dyspnea, wheezing, chest pain, palpitations, orthopnea, edema, abdominal pain, nausea, melena, diarrhea, constipation, flank pain, dysuria, hematuria, urinary  Frequency, nocturia, numbness, tingling, seizures,  Focal weakness, Loss of consciousness,  Tremor, insomnia, depression, anxiety, and suicidal ideation.      Objective:  BP 120/84 mmHg  Temp(Src) 97.8 F (36.6 C) (Oral)  Ht 5\' 3"  (1.6 m)  Wt 167 lb (75.751 kg)  BMI 29.59 kg/m2  Physical Exam   General appearance: alert, cooperative and appears stated age Head: Normocephalic, without obvious abnormality, atraumatic Eyes: conjunctivae/corneas clear. PERRL, EOM's intact. Fundi benign. Ears: normal TM's and external ear canals both ears Nose: Nares normal. Septum midline. Mucosa normal. No drainage or sinus tenderness. Throat: lips, mucosa, and tongue normal; teeth and gums normal Neck: no adenopathy, no carotid bruit, no JVD, supple, symmetrical, trachea midline and thyroid not enlarged, symmetric, no tenderness/mass/nodules Lungs: clear to auscultation bilaterally Breasts: normal appearance, no masses or tenderness Heart: regular rate and rhythm, S1, S2 normal, no murmur, click, rub or gallop Abdomen: soft, non-tender; bowel sounds normal; no masses,  no organomegaly Extremities: extremities normal, atraumatic, no cyanosis or edema Pulses: 2+ and symmetric Skin: Skin color, texture, turgor normal. No rashes or lesions Neurologic: Alert and oriented X 3, normal strength and tone. Normal symmetric reflexes. Normal coordination and gait.    Assessment & Plan:   Problem List Items Addressed This Visit    Hyperlipidemia with target LDL less than 100    Based on current lipid  profile, the risk of clinically significant CAD is 6 % over the next 10 years, using the Framingham risk calculator. The [employer] recommends starting patients aged 32 or higher on moderate intensity statin therapy for LDL between 70-189 and 10 yr risk of CAD > 7.5% ; she does not meet criteria for statin use. .      Relevant Medications   furosemide (LASIX) 20 MG tablet   Essential hypertension, benign    .Well controlled on current regimen. Renal function stable, no changes today.  Lab Results  Component Value Date   CREATININE 0.67 05/15/2016   Lab Results  Component Value Date   NA 140 05/15/2016   K 4.5 05/15/2016   CL 105 05/15/2016   CO2 29 05/15/2016         Relevant Medications   furosemide (LASIX) 20 MG tablet   Encounter for preventive health examination    Annual comprehensive preventive exam was done as well as an evaluation and management of chronic conditions .  During the course of the visit the patient was educated and counseled about appropriate screening and preventive services including :  diabetes screening, lipid analysis with projected  10 year  risk for CAD , nutrition counseling, breast, cervical and colorectal cancer screening, and recommended immunizations.  Printed recommendations for health maintenance screenings was give       Other Visit Diagnoses    Breast cancer screening    -  Primary    Relevant Orders    MM DIGITAL SCREENING BILATERAL       I have discontinued Ms. Mckeough's VIVELLE-DOT and phentermine. I am also having her maintain her metoprolol succinate and furosemide.  Meds ordered this encounter  Medications  . furosemide (LASIX) 20 MG tablet    Sig: Take 1 tablet (20 mg total) by mouth daily as needed.    Dispense:  30 tablet    Refill:  1    Medications Discontinued During This Encounter  Medication Reason  . phentermine (ADIPEX-P) 37.5 MG tablet   . VIVELLE-DOT 0.0375 MG/24HR   . furosemide (LASIX) 20  MG tablet Reorder    Follow-up: No Follow-up on file.   Crecencio Mc, MD

## 2016-05-19 NOTE — Progress Notes (Signed)
Pre visit review using our clinic review tool, if applicable. No additional management support is needed unless otherwise documented below in the visit note. 

## 2016-05-20 ENCOUNTER — Encounter: Payer: Self-pay | Admitting: Internal Medicine

## 2016-07-24 ENCOUNTER — Other Ambulatory Visit: Payer: Self-pay | Admitting: Internal Medicine

## 2016-07-24 DIAGNOSIS — I10 Essential (primary) hypertension: Secondary | ICD-10-CM

## 2016-10-16 ENCOUNTER — Ambulatory Visit (INDEPENDENT_AMBULATORY_CARE_PROVIDER_SITE_OTHER): Payer: BLUE CROSS/BLUE SHIELD | Admitting: Family Medicine

## 2016-10-16 ENCOUNTER — Encounter: Payer: Self-pay | Admitting: Family Medicine

## 2016-10-16 ENCOUNTER — Ambulatory Visit (INDEPENDENT_AMBULATORY_CARE_PROVIDER_SITE_OTHER): Payer: BLUE CROSS/BLUE SHIELD

## 2016-10-16 VITALS — BP 118/78 | HR 61 | Temp 98.4°F | Wt 173.8 lb

## 2016-10-16 DIAGNOSIS — M25532 Pain in left wrist: Secondary | ICD-10-CM | POA: Diagnosis not present

## 2016-10-16 NOTE — Assessment & Plan Note (Signed)
Patient status post left wrist injury. Is improving. Given area of location we will x-ray her wrist to evaluate for fracture. If no fracture she can use ibuprofen. If there is fracture we will refer to orthopedics today. She is given return precautions.

## 2016-10-16 NOTE — Progress Notes (Signed)
Pre visit review using our clinic review tool, if applicable. No additional management support is needed unless otherwise documented below in the visit note. 

## 2016-10-16 NOTE — Progress Notes (Signed)
  Tommi Rumps, MD Phone: [phone]  Carla Mckinney is a 55 y.o. female who presents today for same-day visit.  Left wrist injury: Patient notes 2 days ago she was helping her husband move wood and she tripped over a piece of wood fell backwards and caught herself with her wrist and hand. She notes pain in the dorsal radial aspect of her wrist. No pain in her forearm or hand. Notes it is starting to feel better today. Not much swelling. No numbness or weakness. Notes discomfort with grabbing things. Has been taking ibuprofen intermittently. Overall improving.   ROS see history of present illness  Objective  Physical Exam Vitals:   10/16/16 0834  BP: 118/78  Pulse: 61  Temp: 98.4 F (36.9 C)    BP Readings from Last 3 Encounters:  10/16/16 118/78  05/19/16 120/84  08/02/15 138/80   Wt Readings from Last 3 Encounters:  10/16/16 173 lb 12.8 oz (78.8 kg)  05/19/16 167 lb (75.8 kg)  06/19/15 167 lb 8 oz (76 kg)    Physical Exam  Constitutional: She is well-developed, well-nourished, and in no distress.  Cardiovascular: Normal rate, regular rhythm and normal heart sounds.   Good capillary refill bilateral hands  Pulmonary/Chest: Effort normal and breath sounds normal.  Musculoskeletal:  Left wrist with no swelling, warmth, or erythema, there is mild tenderness over the radial styloid process, no anatomic snuffbox tenderness, no tenderness of the bony portions of her hand or other portions of her wrist, no tenderness of her forearm Right wrist with no swelling, warmth, erythema, or tenderness, no tenderness of the hand or forearm on the right  Neurological: She is alert. Gait normal.  5 out of 5 strength bilateral biceps, triceps, and grip, sensation light touch intact in bilateral upper extremities     Assessment/Plan: Please see individual problem list.  Left wrist pain Patient status post left wrist injury. Is improving. Given area of location we will x-ray her  wrist to evaluate for fracture. If no fracture she can use ibuprofen. If there is fracture we will refer to orthopedics today. She is given return precautions.   Orders Placed This Encounter  Procedures  . DG Wrist Complete Left    Standing Status:   Future    Number of Occurrences:   1    Standing Expiration Date:   12/16/2017    Order Specific Question:   Reason for Exam (SYMPTOM  OR DIAGNOSIS REQUIRED)    Answer:   fall backwards and caught self with left hand, pain in wrist, soreness over ulnar styloid process, no snuff box tenderness    Order Specific Question:   Is patient pregnant?    Answer:   No    Order Specific Question:   Preferred imaging location?    Answer:   [facility]    Tommi Rumps, MD [place]

## 2016-10-16 NOTE — Patient Instructions (Signed)
Nice to meet you. We are going to get an x-ray of your left wrist. Once this returns we'll determine the next step in management. If you develop numbness, weakness, tingling, or any new or changing symptoms please seek medical attention immediately.

## 2016-11-13 ENCOUNTER — Encounter: Payer: Self-pay | Admitting: Internal Medicine

## 2016-12-22 ENCOUNTER — Telehealth: Payer: Self-pay | Admitting: Internal Medicine

## 2016-12-22 NOTE — Telephone Encounter (Signed)
[employer] Call Dated 12/18/16 BPelevated 158/102 wanted to know if she could double up on BP meds . Patient was advised against doubling of medications by [facility].  Headaches recently.   Patient  Does have history of new worsening symptoms  History of hypertension.   No MD office in last 2 weeks.  Patient was urged to see PCP within next 2-3 days. Patient was told to REST lie down in a dark quiet place and place until feeling better.   Apply a cold wet wash cloth or cold pack to forehead.    Patient was urged to see urgent care if PCP was closed   I spoke with patient states her BP has actually came down into 130s./80s  Appointment scheduled to follow up with Dr Derrel Nip on Thursday to further discuss blood pressure issues.   Patient informed if headaches worsens or Blood pressures increase to seek care prior to Thursday at urgent care center or emergency department patient verbalized understanding.

## 2016-12-24 ENCOUNTER — Encounter: Payer: Self-pay | Admitting: Internal Medicine

## 2016-12-24 ENCOUNTER — Ambulatory Visit (INDEPENDENT_AMBULATORY_CARE_PROVIDER_SITE_OTHER): Payer: BLUE CROSS/BLUE SHIELD | Admitting: Internal Medicine

## 2016-12-24 VITALS — BP 138/90 | HR 72 | Temp 98.0°F | Resp 16 | Ht 63.0 in | Wt 176.2 lb

## 2016-12-24 DIAGNOSIS — E6609 Other obesity due to excess calories: Secondary | ICD-10-CM | POA: Diagnosis not present

## 2016-12-24 DIAGNOSIS — B9789 Other viral agents as the cause of diseases classified elsewhere: Secondary | ICD-10-CM | POA: Diagnosis not present

## 2016-12-24 DIAGNOSIS — I10 Essential (primary) hypertension: Secondary | ICD-10-CM

## 2016-12-24 DIAGNOSIS — E785 Hyperlipidemia, unspecified: Secondary | ICD-10-CM

## 2016-12-24 DIAGNOSIS — E669 Obesity, unspecified: Secondary | ICD-10-CM | POA: Insufficient documentation

## 2016-12-24 DIAGNOSIS — E66811 Obesity, class 1: Secondary | ICD-10-CM

## 2016-12-24 DIAGNOSIS — J069 Acute upper respiratory infection, unspecified: Secondary | ICD-10-CM | POA: Diagnosis not present

## 2016-12-24 DIAGNOSIS — Z6830 Body mass index (BMI) 30.0-30.9, adult: Secondary | ICD-10-CM

## 2016-12-24 LAB — COMPREHENSIVE METABOLIC PANEL
ALT: 23 U/L (ref 0–35)
AST: 20 U/L (ref 0–37)
Albumin: 4.6 g/dL (ref 3.5–5.2)
Alkaline Phosphatase: 97 U/L (ref 39–117)
BILIRUBIN TOTAL: 1 mg/dL (ref 0.2–1.2)
BUN: 13 mg/dL (ref 6–23)
CALCIUM: 10.4 mg/dL (ref 8.4–10.5)
CHLORIDE: 102 meq/L (ref 96–112)
CO2: 31 meq/L (ref 19–32)
Creatinine, Ser: 0.74 mg/dL (ref 0.40–1.20)
GFR: 87.23 mL/min (ref >60.00)
GLUCOSE: 101 mg/dL — AB (ref 70–99)
Potassium: 4.4 mEq/L (ref 3.5–5.1)
Sodium: 138 mEq/L (ref 135–145)
Total Protein: 7.2 g/dL (ref 6.0–8.3)

## 2016-12-24 LAB — LIPID PANEL
CHOL/HDL RATIO: 3
CHOLESTEROL: 235 mg/dL — AB (ref 0–200)
HDL: 67.9 mg/dL (ref >39.00)
LDL CALC: 152 mg/dL — AB (ref 0–99)
NonHDL: 167.5
Triglycerides: 78 mg/dL (ref 0.0–149.0)
VLDL: 15.6 mg/dL (ref 0.0–40.0)

## 2016-12-24 LAB — CBC WITH DIFFERENTIAL/PLATELET
Basophils Absolute: 0 10*3/uL (ref 0.0–0.1)
Basophils Relative: 0.5 % (ref 0.0–3.0)
Eosinophils Absolute: 0.1 10*3/uL (ref 0.0–0.7)
Eosinophils Relative: 1.9 % (ref 0.0–5.0)
HCT: 42.9 % (ref 36.0–46.0)
Hemoglobin: 14.6 g/dL (ref 12.0–15.0)
Lymphocytes Relative: 23.2 % (ref 12.0–46.0)
Lymphs Abs: 1.4 10*3/uL (ref 0.7–4.0)
MCHC: 34 g/dL (ref 30.0–36.0)
MCV: 91.5 fl (ref 78.0–100.0)
Monocytes Absolute: 0.6 10*3/uL (ref 0.1–1.0)
Monocytes Relative: 10.8 % (ref 3.0–12.0)
Neutro Abs: 3.8 10*3/uL (ref 1.4–7.7)
Neutrophils Relative %: 63.6 % (ref 43.0–77.0)
Platelets: 272 10*3/uL (ref 150.0–400.0)
RBC: 4.69 Mil/uL (ref 3.87–5.11)
RDW: 13.3 % (ref 11.5–15.5)
WBC: 6 10*3/uL (ref 4.0–10.5)

## 2016-12-24 LAB — HEMOGLOBIN A1C: Hgb A1c MFr Bld: 5.3 % (ref 4.6–6.5)

## 2016-12-24 LAB — MICROALBUMIN / CREATININE URINE RATIO
CREATININE, U: 24.8 mg/dL
Microalb Creat Ratio: 2.8 mg/g (ref 0.0–30.0)

## 2016-12-24 LAB — TSH: TSH: 2.38 u[IU]/mL (ref 0.35–4.50)

## 2016-12-24 MED ORDER — LEVOFLOXACIN 500 MG PO TABS: 500.0000 mg | ORAL_TABLET | Freq: Every day | ORAL | 0 refills | Status: DC

## 2016-12-24 NOTE — Patient Instructions (Addendum)
You can increase the metoprolol to 100 mg daily as long as pulse does ot drop below 60 or make you feel really tired  Goal is 120/70 for bp  alternative to raising metoprolol  is adding 2.5 mg amlodipine daily  .   You have a viral syndrome which is causing bronchitis.    The post nasal drip may alsoe be contributing to  your  Cough. I also advise use of the following OTC meds to help with your other symptoms.   Take generic OTC benadryl 25 mg  At bedtime for the drainage along with  Delsym for cough. flush your sinuses twice daily with Simply Saline  Or Milta Deiters med's sinus rinse   If the coughing has not impoved in  48 hours  OR  if you develop T > 100.4,  Green nasal discharge,  Or facial pain, start the antibiotic.  Please take a probiotic ( Align, Floraque or Culturelle) while you are on the antibiotic to prevent a serious antibiotic associated diarrhea  Called clostridium dificile colitis and a vaginal yeast infection    I want you to lose 18 lbs over the next 6 months (10% of your body weight) through regular exercise and a low glycemic index diet.

## 2016-12-24 NOTE — Progress Notes (Signed)
Subjective:  Patient ID: Carla Mckinney, female    DOB: 1961/05/19  Age: 56 y.o. MRN: [id number]  CC: The primary encounter diagnosis was Hyperlipidemia with target LDL less than 100. Diagnoses of Essential hypertension, benign, Viral URI with cough, and Class 1 obesity due to excess calories without serious comorbidity with body mass index (BMI) of 30.0 to 30.9 in adult were also pertinent to this visit.  HPI Carla Mckinney presents for  1)  follow up on hypertension.  She is taking Toprol XL 50 mg daily, but  has been having recurrent episodes of elevations as high as 158/102 on dec 29 .  She has had prior trial of lisinopril which caused angioedema     2) uri symptoms lasting 3-4 days,  No fevers, pleurisy or shortness of breath    3) Declines flu shot,  Working from home     Outpatient Medications Prior to Visit  Medication Sig Dispense Refill  . furosemide (LASIX) 20 MG tablet Take 1 tablet (20 mg total) by mouth daily as needed. 30 tablet 1  . metoprolol succinate (TOPROL-XL) 50 MG 24 hr tablet TAKE 1 TABLET (50 MG TOTAL) BY MOUTH DAILY. 90 tablet 3   No facility-administered medications prior to visit.     Review of Systems;  Patient denies headache, fevers, malaise, unintentional weight loss, skin rash, eye pain,, sore throat, dysphagia,  hemoptysis, dyspnea, wheezing, chest pain, palpitations, orthopnea, edema, abdominal pain, nausea, melena, diarrhea, constipation, flank pain, dysuria, hematuria, urinary  Frequency, nocturia, numbness, tingling, seizures,  Focal weakness, Loss of consciousness,  Tremor, insomnia, depression, anxiety, and suicidal ideation.      Objective:  BP 138/90   Pulse 72   Temp 98 F (36.7 C) (Oral)   Resp 16   Ht 5\' 3"  (1.6 m)   Wt 176 lb 4 oz (79.9 kg)   SpO2 98%   BMI 31.22 kg/m   BP Readings from Last 3 Encounters:  12/24/16 138/90  10/16/16 118/78  05/19/16 120/84    Wt Readings from Last 3 Encounters:  12/24/16 176 lb 4 oz  (79.9 kg)  10/16/16 173 lb 12.8 oz (78.8 kg)  05/19/16 167 lb (75.8 kg)    General appearance: alert, cooperative and appears stated age Ears: normal TM's and external ear canals both ears Throat: lips, mucosa, and tongue normal; teeth and gums normal Neck: no adenopathy, no carotid bruit, supple, symmetrical, trachea midline and thyroid not enlarged, symmetric, no tenderness/mass/nodules Back: symmetric, no curvature. ROM normal. No CVA tenderness. Lungs: clear to auscultation bilaterally Heart: regular rate and rhythm, S1, S2 normal, no murmur, click, rub or gallop Abdomen: soft, non-tender; bowel sounds normal; no masses,  no organomegaly Pulses: 2+ and symmetric Skin: Skin color, texture, turgor normal. No rashes or lesions Lymph nodes: Cervical, supraclavicular, and axillary nodes normal.  Lab Results  Component Value Date   HGBA1C 5.3 12/24/2016    Lab Results  Component Value Date   CREATININE 0.74 12/24/2016   CREATININE 0.67 05/15/2016   CREATININE 0.67 06/19/2015    Lab Results  Component Value Date   WBC 6.0 12/24/2016   HGB 14.6 12/24/2016   HCT 42.9 12/24/2016   PLT 272.0 12/24/2016   GLUCOSE 101 (H) 12/24/2016   CHOL 235 (H) 12/24/2016   TRIG 78.0 12/24/2016   HDL 67.90 12/24/2016   LDLDIRECT 154.1 02/23/2013   LDLCALC 152 (H) 12/24/2016   ALT 23 12/24/2016   AST 20 12/24/2016   NA 138 12/24/2016  K 4.4 12/24/2016   CL 102 12/24/2016   CREATININE 0.74 12/24/2016   BUN 13 12/24/2016   CO2 31 12/24/2016   TSH 2.38 12/24/2016   HGBA1C 5.3 12/24/2016   MICROALBUR <0.7 12/24/2016    Assessment & Plan:   Problem List Items Addressed This Visit    Essential hypertension, benign    Not well controlled.  Advised to increase toprol to 100 mg daily for goal 120/70.  If not tolerated,  Will reduce dose to 50 mg daily and start amlodipine 2.5 mg daily      Relevant Orders   Comprehensive metabolic panel (Completed)   Microalbumin / creatinine urine  ratio (Completed)   Hyperlipidemia with target LDL less than 100 - Primary    Based on current lipid profile, the risk of clinically significant CAD is 8.7 % over the next 10 years, using the Framingham risk calculator. The [employer] recommends starting patients aged 32 or higher on moderate intensity statin therapy for LDL between 70-189 and 10 yr risk of CAD > 7.5% ; she is deferring statin therapy at this time.   Lab Results  Component Value Date   CHOL 235 (H) 12/24/2016   HDL 67.90 12/24/2016   LDLCALC 152 (H) 12/24/2016   LDLDIRECT 154.1 02/23/2013   TRIG 78.0 12/24/2016   CHOLHDL 3 12/24/2016         Relevant Orders   Lipid panel (Completed)   Obesity    I have addressed  BMI and recommended wt loss of 10% of body weight over the next 6 months using a low fat, low starch, high protein  fruit/vegetable based Mediterranean diet and 30 minutes of aerobic exercise a minimum of 5 days per week. She has no signs of diabetes .   Lab Results  Component Value Date   HGBA1C 5.3 12/24/2016         Relevant Orders   Hemoglobin A1c (Completed)   TSH (Completed)   Viral URI with cough    URI is most likely viral given the mild HEENT Symptoms  And normal exam.   I have explained that in viral URIS, an antibiotic will not help the symptoms and will increase the risk of developing diarrhea.,  Continue oral and nasal decongestants,prn tylenol 650 mq 8 hrs for aches and pains,  Sinus flushes with Milta Deiters Med's rinse,  prednisone  taper for inflammation, and cough suppressant.   Advised to start antibiotic only if symptoms of bacterial sinusitis occur, and advised to take probiotic if the antibiotic is taken,  For a minimum of 3 weeks.       Relevant Orders   CBC with Differential/Platelet (Completed)      I am having Ms. Kosinski start on levofloxacin. I am also having her maintain her furosemide and metoprolol succinate.  Meds ordered this encounter  Medications  .  levofloxacin (LEVAQUIN) 500 MG tablet    Sig: Take 1 tablet (500 mg total) by mouth daily.    Dispense:  7 tablet    Refill:  0    There are no discontinued medications.  Follow-up: Return in about 6 months (around 06/23/2017).   Crecencio Mc, MD

## 2016-12-26 ENCOUNTER — Encounter: Payer: Self-pay | Admitting: Internal Medicine

## 2016-12-26 DIAGNOSIS — Z79899 Other long term (current) drug therapy: Secondary | ICD-10-CM

## 2016-12-26 DIAGNOSIS — E785 Hyperlipidemia, unspecified: Secondary | ICD-10-CM

## 2016-12-26 NOTE — Assessment & Plan Note (Signed)
Not well controlled.  Advised to increase toprol to 100 mg daily for goal 120/70.  If not tolerated,  Will reduce dose to 50 mg daily and start amlodipine 2.5 mg daily

## 2016-12-26 NOTE — Assessment & Plan Note (Signed)
URI is most likely viral given the mild HEENT Symptoms  And normal exam.   I have explained that in viral URIS, an antibiotic will not help the symptoms and will increase the risk of developing diarrhea.,  Continue oral and nasal decongestants,prn tylenol 650 mq 8 hrs for aches and pains,  Sinus flushes with Neil Med's rinse,  prednisone  taper for inflammation, and cough suppressant.   Advised to start antibiotic only if symptoms of bacterial sinusitis occur, and advised to take probiotic if the antibiotic is taken,  For a minimum of 3 weeks.  

## 2016-12-26 NOTE — Assessment & Plan Note (Signed)
I have addressed  BMI and recommended wt loss of 10% of body weight over the next 6 months using a low fat, low starch, high protein  fruit/vegetable based Mediterranean diet and 30 minutes of aerobic exercise a minimum of 5 days per week. She has no signs of diabetes .   Lab Results  Component Value Date   HGBA1C 5.3 12/24/2016

## 2016-12-26 NOTE — Assessment & Plan Note (Signed)
Based on current lipid profile, the risk of clinically significant CAD is 8.7 % over the next 10 years, using the Framingham risk calculator. The [employer] recommends starting patients aged 54 or higher on moderate intensity statin therapy for LDL between 70-189 and 10 yr risk of CAD > 7.5% ; she is deferring statin therapy at this time.   Lab Results  Component Value Date   CHOL 235 (H) 12/24/2016   HDL 67.90 12/24/2016   LDLCALC 152 (H) 12/24/2016   LDLDIRECT 154.1 02/23/2013   TRIG 78.0 12/24/2016   CHOLHDL 3 12/24/2016

## 2016-12-28 NOTE — Telephone Encounter (Signed)
FYI patient is trying Red Yeast Rice.

## 2016-12-29 ENCOUNTER — Telehealth: Payer: Self-pay

## 2016-12-29 NOTE — Telephone Encounter (Signed)
Notified patient of her Carla Mckinney Form being faxed to consultants at [phone]

## 2017-01-14 ENCOUNTER — Other Ambulatory Visit: Payer: Self-pay | Admitting: Internal Medicine

## 2017-01-14 ENCOUNTER — Encounter: Payer: Self-pay | Admitting: Internal Medicine

## 2017-01-14 DIAGNOSIS — I10 Essential (primary) hypertension: Secondary | ICD-10-CM

## 2017-01-14 MED ORDER — METOPROLOL SUCCINATE ER 100 MG PO TB24: 100.0000 mg | ORAL_TABLET | Freq: Every day | ORAL | 0 refills | Status: DC

## 2017-02-05 ENCOUNTER — Other Ambulatory Visit (INDEPENDENT_AMBULATORY_CARE_PROVIDER_SITE_OTHER): Payer: BLUE CROSS/BLUE SHIELD

## 2017-02-05 DIAGNOSIS — E785 Hyperlipidemia, unspecified: Secondary | ICD-10-CM

## 2017-02-05 DIAGNOSIS — Z79899 Other long term (current) drug therapy: Secondary | ICD-10-CM

## 2017-02-05 LAB — HEPATIC FUNCTION PANEL
ALK PHOS: 93 U/L (ref 39–117)
ALT: 19 U/L (ref 0–35)
AST: 19 U/L (ref 0–37)
Albumin: 4.3 g/dL (ref 3.5–5.2)
BILIRUBIN DIRECT: 0.1 mg/dL (ref 0.0–0.3)
BILIRUBIN TOTAL: 0.8 mg/dL (ref 0.2–1.2)
TOTAL PROTEIN: 7.1 g/dL (ref 6.0–8.3)

## 2017-02-05 LAB — LIPID PANEL
CHOL/HDL RATIO: 3
Cholesterol: 233 mg/dL — ABNORMAL HIGH (ref 0–200)
HDL: 66.9 mg/dL (ref >39.00)
LDL CALC: 150 mg/dL — AB (ref 0–99)
NONHDL: 166.24
TRIGLYCERIDES: 83 mg/dL (ref 0.0–149.0)
VLDL: 16.6 mg/dL (ref 0.0–40.0)

## 2017-02-07 ENCOUNTER — Encounter: Payer: Self-pay | Admitting: Internal Medicine

## 2017-02-10 ENCOUNTER — Other Ambulatory Visit: Payer: BLUE CROSS/BLUE SHIELD

## 2017-04-12 ENCOUNTER — Other Ambulatory Visit: Payer: Self-pay | Admitting: Internal Medicine

## 2017-04-12 DIAGNOSIS — I10 Essential (primary) hypertension: Secondary | ICD-10-CM

## 2017-04-13 ENCOUNTER — Encounter: Payer: Self-pay | Admitting: Internal Medicine

## 2017-04-13 ENCOUNTER — Other Ambulatory Visit: Payer: Self-pay | Admitting: Internal Medicine

## 2017-04-13 DIAGNOSIS — I10 Essential (primary) hypertension: Secondary | ICD-10-CM

## 2017-04-13 MED ORDER — METOPROLOL SUCCINATE ER 100 MG PO TB24: 100.0000 mg | ORAL_TABLET | Freq: Every day | ORAL | 0 refills | Status: DC

## 2017-04-14 MED FILL — METOPROLOL SUCC ER 100 MG T: 100 | 90 days supply | Qty: 90 | Fill #0

## 2017-04-15 MED ORDER — METOPROLOL SUCCINATE ER 100 MG PO TB24: 100.0000 mg | ORAL_TABLET | Freq: Every day | ORAL | 0 refills | Status: DC

## 2017-04-15 NOTE — Telephone Encounter (Signed)
Medication has been refilled.

## 2017-07-12 MED FILL — METOPROLOL SUCC ER 100 MG T: 100 | 90 days supply | Qty: 90 | Fill #0

## 2017-07-23 ENCOUNTER — Encounter: Payer: Self-pay | Admitting: Internal Medicine

## 2017-07-23 DIAGNOSIS — Z1211 Encounter for screening for malignant neoplasm of colon: Secondary | ICD-10-CM

## 2017-07-23 DIAGNOSIS — Z1239 Encounter for other screening for malignant neoplasm of breast: Secondary | ICD-10-CM

## 2017-07-23 NOTE — Telephone Encounter (Signed)
The mammogram has been ordered but the pt also needs a referral put in for a colonoscopy.

## 2017-07-23 NOTE — Telephone Encounter (Signed)
  Your referral is in process as discussed for colonoscopy and the mammogram as well.  Our referral coordinator will call you when the appointment has been made.   Regards,   Deborra Medina, MD

## 2017-08-06 ENCOUNTER — Other Ambulatory Visit: Payer: Self-pay

## 2017-08-06 ENCOUNTER — Telehealth: Payer: Self-pay

## 2017-08-06 DIAGNOSIS — Z1211 Encounter for screening for malignant neoplasm of colon: Secondary | ICD-10-CM

## 2017-08-06 NOTE — Telephone Encounter (Signed)
Gastroenterology Pre-Procedure Review  Request Date: 9/26 Requesting Physician: Dr. Marius Ditch  PATIENT REVIEW QUESTIONS: The patient responded to the following health history questions as indicated:    *No major health issues at this time.   1. Are you having any GI issues? no 2. Do you have a personal history of Polyps? no 3. Do you have a family history of Colon Cancer or Polyps? yes (father: polyps) 4. Diabetes Mellitus? no 5. Joint replacements in the past 12 months?no 6. Major health problems in the past 3 months?no 7. Any artificial heart valves, MVP, or defibrillator?no    MEDICATIONS & ALLERGIES:    Patient reports the following regarding taking any anticoagulation/antiplatelet therapy:   Plavix, Coumadin, Eliquis, Xarelto, Lovenox, Pradaxa, Brilinta, or Effient? no Aspirin? yes (81mg )  Patient confirms/reports the following medications:  Current Outpatient Prescriptions  Medication Sig Dispense Refill  . furosemide (LASIX) 20 MG tablet Take 1 tablet (20 mg total) by mouth daily as needed. 30 tablet 1  . levofloxacin (LEVAQUIN) 500 MG tablet Take 1 tablet (500 mg total) by mouth daily. 7 tablet 0  . metoprolol succinate (TOPROL-XL) 100 MG 24 hr tablet Take 1 tablet (100 mg total) by mouth daily. 90 tablet 0   No current facility-administered medications for this visit.     Patient confirms/reports the following allergies:  Allergies  Allergen Reactions  . Lisinopril Swelling    No orders of the defined types were placed in this encounter.   AUTHORIZATION INFORMATION Primary Insurance: 1D#: Group #:  Secondary Insurance: 1D#: Group #:  SCHEDULE INFORMATION: Date: 9/26 Time: Location: Dr. Marius Ditch

## 2017-08-11 ENCOUNTER — Telehealth: Payer: Self-pay | Admitting: Internal Medicine

## 2017-08-11 ENCOUNTER — Encounter: Payer: Self-pay | Admitting: Internal Medicine

## 2017-08-11 NOTE — Telephone Encounter (Signed)
Left message for patient to call back  

## 2017-08-11 NOTE — Telephone Encounter (Signed)
Patient gave verbal understanding of dosage increase and instructed patient to let us know if symptoms persist or become acute and to go to urgent care/ED if after hours.

## 2017-08-11 NOTE — Telephone Encounter (Signed)
If she is taking Toprol,  increas dose to 1.5 tablets,  To lower her BP and heart rate.

## 2017-08-11 NOTE — Telephone Encounter (Signed)
See my chart message from [place] LPN

## 2017-08-11 NOTE — Telephone Encounter (Signed)
fyi

## 2017-08-11 NOTE — Telephone Encounter (Signed)
Pt sent a Mychart message regarding. Message from pt. I've been feeling my heart beating all afternoon. My watch shows my heart rate is ranging from 100 to 126.  I checked my blood pressure, using my home wrist monitor, and it said my BP was 179/107. My head has hurt everyday lately, and I feel that may be related to my blood pressure as well. Pt was transferred to [facility].

## 2017-08-11 NOTE — Telephone Encounter (Signed)
Appointment Request From: Carla Mckinney. Carla Mckinney    With Provider: Crecencio Mc, MD [facility]]    Preferred Date Range: Any date 08/11/2017 or later    Preferred Times: Any    Reason for visit: Office Visit    Comments:  I've been feeling my heart beating all afternoon. My watch shows my heart rate is ranging from 100 to 126.  I checked my blood pressure, using my home wrist monitor, and it said my BP was 179/107. My head has hurt everyday lately, and I feel that may be related to my blood pressure as well.

## 2017-08-11 NOTE — Telephone Encounter (Signed)
[facility] Mckinney     Patient Name: Carla Mckinney Initial Comment Caller state heart range is 100-126; BP is 179/107 having a headache daily.   DOB: 05-Oct-1961      Nurse Assessment  Nurse: Jimmey Ralph, RN, Lissa Date/Time (Eastern Time): 08/11/2017 3:12:24 PM  Confirm and document reason for call. If symptomatic, describe symptoms. ---Caller state heart range is 100-126 this started about 45 minutes ago; BP is 179/107 having a headache daily. I have high BP and lately my BP is elevated. Metoprolol is my medication. No chest pain but my chest has felt weird and I can feel my heart beating in my ear. It isn't coming and going and I still feel it. Took BP about 1/2 hour ago.  Does the patient have any new or worsening symptoms? ---Yes  Will a triage be completed? ---Yes  Related visit to physician within the last 2 weeks? ---No  Does the PT have any chronic conditions? (i.e. diabetes, asthma, etc.) ---Yes  List chronic conditions. ---High BP  Is the patient pregnant or possibly pregnant? (Ask all females between the ages of 94-55) ---No  Is this a behavioral health or substance abuse call? ---No    Guidelines     Guideline Title Affirmed Question Affirmed Notes   Heart Rate and Heartbeat Questions Taking water pill (i.e., diuretic) or heart medication (e.g., digoxin)    Final Disposition User   See Physician within 4 Hours (or PCP triage) Hammonds, RN, Lissa   Comments   Heart rate is 122 now and it is normally in 80s.   Called back line spoke to [facility] Loss adjuster, chartered) she stated they are trying to reach [place] and have spoken to the Dr. about her symptoms and will call her back. Advised caller of office will be calling her for further guidance. Advised caller to call back for any new or worsening symptoms. Caller verbalized understanding.   Back line contacted and no appointments available today.   Referrals    REFERRED TO PCP OFFICE   Disagree/Comply: Comply

## 2017-08-11 NOTE — Telephone Encounter (Signed)
LM on vm to call office and make an appt with Dr. Derrel Nip or Vidal Schwalbe.

## 2017-08-11 NOTE — Telephone Encounter (Signed)
Noted  

## 2017-08-17 ENCOUNTER — Encounter: Payer: Self-pay | Admitting: Internal Medicine

## 2017-08-25 ENCOUNTER — Telehealth: Payer: Self-pay

## 2017-08-25 ENCOUNTER — Other Ambulatory Visit: Payer: Self-pay

## 2017-08-25 DIAGNOSIS — Z1211 Encounter for screening for malignant neoplasm of colon: Secondary | ICD-10-CM

## 2017-08-25 NOTE — Telephone Encounter (Signed)
Patient has been contacted to reschedule her colonoscopy from [facility] to [facility] on 09/15/17 because Dr. Marius Ditch is not scoping at [facility] but [facility].  She has been scheduled at [facility] on 09/15/17.

## 2017-08-27 ENCOUNTER — Ambulatory Visit: Payer: 59 | Admitting: Family Medicine

## 2017-08-27 DIAGNOSIS — Z1833 Retained wood fragments: Secondary | ICD-10-CM | POA: Diagnosis not present

## 2017-08-31 ENCOUNTER — Ambulatory Visit
Admission: RE | Admit: 2017-08-31 | Discharge: 2017-08-31 | Disposition: A | Discharge status: Home / Self Care | Payer: 59 | Source: Ambulatory Visit | Attending: Internal Medicine | Admitting: Internal Medicine

## 2017-08-31 DIAGNOSIS — Z1231 Encounter for screening mammogram for malignant neoplasm of breast: Secondary | ICD-10-CM | POA: Insufficient documentation

## 2017-08-31 DIAGNOSIS — Z1239 Encounter for other screening for malignant neoplasm of breast: Secondary | ICD-10-CM

## 2017-09-08 ENCOUNTER — Encounter: Payer: Self-pay | Admitting: *Deleted

## 2017-09-14 NOTE — Discharge Instructions (Signed)
General Anesthesia, Adult, Care After °These instructions provide you with information about caring for yourself after your procedure. Your health care provider may also give you more specific instructions. Your treatment has been planned according to current medical practices, but problems sometimes occur. Call your health care provider if you have any problems or questions after your procedure. °What can I expect after the procedure? °After the procedure, it is common to have: °· Vomiting. °· A sore throat. °· Mental slowness. ° °It is common to feel: °· Nauseous. °· Cold or shivery. °· Sleepy. °· Tired. °· Sore or achy, even in parts of your body where you did not have surgery. ° °Follow these instructions at home: °For at least 24 hours after the procedure: °· Do not: °? Participate in activities where you could fall or become injured. °? Drive. °? Use heavy machinery. °? Drink alcohol. °? Take sleeping pills or medicines that cause drowsiness. °? Make important decisions or sign legal documents. °? Take care of children on your own. °· Rest. °Eating and drinking °· If you vomit, drink water, juice, or soup when you can drink without vomiting. °· Drink enough fluid to keep your urine clear or pale yellow. °· Make sure you have little or no nausea before eating solid foods. °· Follow the diet recommended by your health care provider. °General instructions °· Have a responsible adult stay with you until you are awake and alert. °· Return to your normal activities as told by your health care provider. Ask your health care provider what activities are safe for you. °· Take over-the-counter and prescription medicines only as told by your health care provider. °· If you smoke, do not smoke without supervision. °· Keep all follow-up visits as told by your health care provider. This is important. °Contact a health care provider if: °· You continue to have nausea or vomiting at home, and medicines are not helpful. °· You  cannot drink fluids or start eating again. °· You cannot urinate after 8-12 hours. °· You develop a skin rash. °· You have fever. °· You have increasing redness at the site of your procedure. °Get help right away if: °· You have difficulty breathing. °· You have chest pain. °· You have unexpected bleeding. °· You feel that you are having a life-threatening or urgent problem. °This information is not intended to replace advice given to you by your health care provider. Make sure you discuss any questions you have with your health care provider. °Document Released: 03/15/2001 Document Revised: 05/11/2016 Document Reviewed: 11/21/2015 °Elsevier Interactive Patient Education © 2018 [employer]. ° °

## 2017-09-15 ENCOUNTER — Encounter
Admission: RE | Disposition: A | Discharge status: Home / Self Care | Payer: Self-pay | Source: Ambulatory Visit | Attending: Gastroenterology

## 2017-09-15 ENCOUNTER — Ambulatory Visit
Admission: RE | Admit: 2017-09-15 | Discharge: 2017-09-15 | Disposition: A | Discharge status: Home / Self Care | Payer: 59 | Source: Ambulatory Visit | Attending: Gastroenterology | Admitting: Gastroenterology

## 2017-09-15 ENCOUNTER — Ambulatory Visit: Payer: 59 | Admitting: Anesthesiology

## 2017-09-15 ENCOUNTER — Ambulatory Visit: Admit: 2017-09-15 | Payer: BLUE CROSS/BLUE SHIELD

## 2017-09-15 DIAGNOSIS — I10 Essential (primary) hypertension: Secondary | ICD-10-CM | POA: Insufficient documentation

## 2017-09-15 DIAGNOSIS — Z1211 Encounter for screening for malignant neoplasm of colon: Secondary | ICD-10-CM | POA: Diagnosis not present

## 2017-09-15 DIAGNOSIS — D122 Benign neoplasm of ascending colon: Secondary | ICD-10-CM | POA: Diagnosis not present

## 2017-09-15 DIAGNOSIS — Z79899 Other long term (current) drug therapy: Secondary | ICD-10-CM | POA: Diagnosis not present

## 2017-09-15 DIAGNOSIS — Z7982 Long term (current) use of aspirin: Secondary | ICD-10-CM | POA: Insufficient documentation

## 2017-09-15 HISTORY — DX: Rheumatic fever without heart involvement: I00

## 2017-09-15 HISTORY — PX: COLONOSCOPY WITH PROPOFOL: SHX5780

## 2017-09-15 SURGERY — COLONOSCOPY WITH PROPOFOL
Anesthesia: General

## 2017-09-15 SURGERY — COLONOSCOPY WITH PROPOFOL
Anesthesia: General | Wound class: Clean Contaminated

## 2017-09-15 MED ORDER — LIDOCAINE HCL (CARDIAC) 20 MG/ML IV SOLN
INTRAVENOUS | Status: DC | PRN
  Administered 2017-09-15: 40 mg via INTRAVENOUS

## 2017-09-15 MED ORDER — STERILE WATER FOR IRRIGATION IR SOLN
Status: DC | PRN
  Administered 2017-09-15: 09:00:00

## 2017-09-15 MED ORDER — PROPOFOL 10 MG/ML IV BOLUS
INTRAVENOUS | Status: DC | PRN
  Administered 2017-09-15: 50 mg via INTRAVENOUS
  Administered 2017-09-15 (×2): 100 mg via INTRAVENOUS
  Administered 2017-09-15: 50 mg via INTRAVENOUS

## 2017-09-15 MED ORDER — LACTATED RINGERS IV SOLN
10.0000 mL/h | INTRAVENOUS | Status: DC
  Administered 2017-09-15: 10 mL/h via INTRAVENOUS

## 2017-09-15 SURGICAL SUPPLY — 23 items

## 2017-09-15 NOTE — Anesthesia Procedure Notes (Signed)
Procedure Name: MAC Date/Time: 09/15/2017 9:01 AM Performed by: Janna Arch Pre-anesthesia Checklist: Patient identified, Emergency Drugs available, Suction available and Patient being monitored Patient Re-evaluated:Patient Re-evaluated prior to induction Oxygen Delivery Method: Nasal cannula

## 2017-09-15 NOTE — H&P (Signed)
Cephas Darby, MD [address]  [place]  [place], [place] [address]  Main: [phone]  Fax: [phone] Pager: [phone]  Primary Care Physician:  Crecencio Mc, MD Primary Gastroenterologist:  Dr. Cephas Darby  Pre-Procedure History & Physical: HPI:  Carla Mckinney is a 56 y.o. female is here for an colonoscopy.   Past Medical History:  Diagnosis Date  . Hypertension   . Rheumatic fever 1986   has had echo since then. no cardiac issues.    Past Surgical History:  Procedure Laterality Date  . ABDOMINAL HYSTERECTOMY    . CARDIAC CATHETERIZATION  2002   normal, due to syncope and abnormal myowiew ([place])  . MASTOPEXY  2008  . OOPHORECTOMY     secondary to scar tissue,  [facility]  . TONSILLECTOMY    . TUBAL LIGATION      Prior to Admission medications   Medication Sig Start Date End Date Taking? Authorizing Provider  aspirin 81 MG tablet Take 81 mg by mouth daily.   Yes [provider]  ibuprofen (ADVIL,MOTRIN) 200 MG tablet Take 200 mg by mouth as needed.   Yes [provider]  metoprolol succinate (TOPROL-XL) 100 MG 24 hr tablet Take 1 tablet (100 mg total) by mouth daily. 04/15/17  Yes Crecencio Mc, MD  furosemide (LASIX) 20 MG tablet Take 1 tablet (20 mg total) by mouth daily as needed. 05/19/16   Crecencio Mc, MD    Allergies as of 08/25/2017 - Review Complete 12/24/2016  Allergen Reaction Noted  . Lisinopril Swelling 12/24/2013    Family History  Problem Relation Age of Onset  . Heart disease Mother   . Heart failure Mother   . Hypertension Father   . Hyperlipidemia Father   . Heart disease Maternal Grandfather   . Heart disease Paternal Grandmother   . Stroke Paternal Grandmother 85  . Cancer Paternal Grandfather        multiple myeloma  . Breast cancer Neg Hx     Social History   Social History  . Marital status: Married    Spouse name: N/A  . Number of children: 2  . Years of education: N/A   Occupational  History  . Not on file.   Social History Main Topics  . Smoking status: Never Smoker  . Smokeless tobacco: Never Used  . Alcohol use 0.6 oz/week    1 Glasses of wine per week     Comment: socially  . Drug use: No  . Sexual activity: Yes   Other Topics Concern  . Not on file   Social History Narrative  . No narrative on file    Review of Systems: See HPI, otherwise negative ROS  Physical Exam: Ht '5\' 3"'  (1.6 m)   Wt 176 lb (79.8 kg)   BMI 31.18 kg/m  General:   Alert,  pleasant and cooperative in NAD Head:  Normocephalic and atraumatic. Neck:  Supple; no masses or thyromegaly. Lungs:  Clear throughout to auscultation.    Heart:  Regular rate and rhythm. Abdomen:  Soft, nontender and nondistended. Normal bowel sounds, without guarding, and without rebound.   Neurologic:  Alert and  oriented x4;  grossly normal neurologically.  Impression/Plan: Carla Mckinney is here for an colonoscopy to be performed for screening colon cancer  Risks, benefits, limitations, and alternatives regarding  colonoscopy have been reviewed with the patient.  Questions have been answered.  All parties agreeable.   Sherri Sear, MD  09/15/2017,  7:32 AM

## 2017-09-15 NOTE — Op Note (Signed)
[facility] Patient Name: Carla Mckinney Procedure Date: 09/15/2017 8:55 AM MRN: [id number] Account #: [id number] Date of Birth: [birth year] Admit Type: Outpatient Age: 56 Room: [facility] OR ROOM 01 Gender: Female Note Status: Finalized Procedure:            Colonoscopy Indications:          Screening for colorectal malignant neoplasm, This is                        the patient's first colonoscopy Providers:            Lin Landsman MD, MD Referring MD:         Deborra Medina, MD (Referring MD) Medicines:            Monitored Anesthesia Care Complications:        No immediate complications. Estimated blood loss: None. Procedure:            Pre-Anesthesia Assessment:                       - Prior to the procedure, a History and Physical was                        performed, and patient medications and allergies were                        reviewed. The patient is competent. The risks and                        benefits of the procedure and the sedation options and                        risks were discussed with the patient. All questions                        were answered and informed consent was obtained.                        Patient identification and proposed procedure were                        verified by the physician, the nurse, the                        anesthesiologist, the anesthetist and the technician in                        the pre-procedure area in the procedure room. Mental                        Status Examination: alert and oriented. Airway                        Examination: normal oropharyngeal airway and neck                        mobility. Respiratory Examination: clear to                        auscultation. CV Examination: normal. Prophylactic  Antibiotics: The patient does not require prophylactic                        antibiotics. Prior Anticoagulants: The patient has   taken aspirin, last dose was 1 day prior to procedure.                        ASA Grade Assessment: II - A patient with mild systemic                        disease. After reviewing the risks and benefits, the                        patient was deemed in satisfactory condition to undergo                        the procedure. The anesthesia plan was to use monitored                        anesthesia care (MAC). Immediately prior to                        administration of medications, the patient was                        re-assessed for adequacy to receive sedatives. The                        heart rate, respiratory rate, oxygen saturations, blood                        pressure, adequacy of pulmonary ventilation, and                        response to care were monitored throughout the                        procedure. The physical status of the patient was                        re-assessed after the procedure.                       After obtaining informed consent, the colonoscope was                        passed under direct vision. Throughout the procedure,                        the patient's blood pressure, pulse, and oxygen                        saturations were monitored continuously. The Olympus                        Colonoscope 190 [id number]) was introduced through the                        anus and advanced to the the terminal ileum. The  colonoscopy was performed without difficulty. The                        patient tolerated the procedure well. The quality of                        the bowel preparation was evaluated using the BBPS                        [facility] Bowel Preparation Scale) with scores of: Right                        Colon = 3, Transverse Colon = 3 and Left Colon = 3                        (entire mucosa seen well with no residual staining,                        small fragments of stool or opaque liquid). The total                         BBPS score equals 9. Findings:      The perianal and digital rectal examinations were normal. Pertinent       negatives include normal sphincter tone and no palpable rectal lesions.      The terminal ileum appeared normal.      The entire examined colon appeared normal.      The retroflexed view of the distal rectum and anal verge was normal and       showed no anal or rectal abnormalities. Impression:           - The examined portion of the ileum was normal.                       - The entire examined colon is normal.                       - The distal rectum and anal verge are normal on                        retroflexion view.                       - No specimens collected. Recommendation:       - Discharge patient to home.                       - Resume previous diet today.                       - Continue present medications.                       - Repeat colonoscopy in 10 years for surveillance. Procedure Code(s):    --- Professional ---                       B7628, Colorectal cancer screening; colonoscopy on                        individual not meeting criteria for high risk  Diagnosis Code(s):    --- Professional ---                       Z12.11, Encounter for screening for malignant neoplasm                        of colon CPT copyright 2016 American Medical Association. All rights reserved. The codes documented in this report are preliminary and upon coder review may  be revised to meet current compliance requirements. Dr. Ulyess Mort Lin Landsman MD, MD 09/15/2017 9:21:42 AM This report has been signed electronically. Number of Addenda: 0 Note Initiated On: 09/15/2017 8:55 AM Scope Withdrawal Time: 0 hours 6 minutes 55 seconds  Total Procedure Duration: 0 hours 9 minutes 38 seconds       [facility]

## 2017-09-15 NOTE — Anesthesia Postprocedure Evaluation (Signed)
Anesthesia Post Note  Patient: Carla Mckinney  Procedure(s) Performed: Procedure(s) (LRB): COLONOSCOPY WITH PROPOFOL (N/A)  Patient location during evaluation: PACU Anesthesia Type: General Level of consciousness: awake and alert Pain management: pain level controlled Vital Signs Assessment: post-procedure vital signs reviewed and stable Respiratory status: spontaneous breathing, nonlabored ventilation, respiratory function stable and patient connected to nasal cannula oxygen Cardiovascular status: blood pressure returned to baseline and stable Postop Assessment: no apparent nausea or vomiting Anesthetic complications: no    Sheria Rosello

## 2017-09-15 NOTE — Transfer of Care (Signed)
Immediate Anesthesia Transfer of Care Note  Patient: Carla Mckinney  Procedure(s) Performed: Procedure(s): COLONOSCOPY WITH PROPOFOL (N/A)  Patient Location: PACU  Anesthesia Type: General  Level of Consciousness: awake, alert  and patient cooperative  Airway and Oxygen Therapy: Patient Spontanous Breathing and Patient connected to supplemental oxygen  Post-op Assessment: Post-op Vital signs reviewed, Patient's Cardiovascular Status Stable, Respiratory Function Stable, Patent Airway and No signs of Nausea or vomiting  Post-op Vital Signs: Reviewed and stable  Complications: No apparent anesthesia complications

## 2017-09-15 NOTE — Anesthesia Preprocedure Evaluation (Signed)
Anesthesia Evaluation  Patient identified by MRN, date of birth, ID band  Reviewed: NPO status   History of Anesthesia Complications Negative for: history of anesthetic complications  Airway Mallampati: II  TM Distance: >3 FB Neck ROM: full    Dental no notable dental hx.    Pulmonary neg pulmonary ROS,    Pulmonary exam normal        Cardiovascular Exercise Tolerance: Good hypertension, Normal cardiovascular exam     Neuro/Psych negative neurological ROS  negative psych ROS   GI/Hepatic negative GI ROS, Neg liver ROS,   Endo/Other  negative endocrine ROS  Renal/GU negative Renal ROS  negative genitourinary   Musculoskeletal   Abdominal   Peds  Hematology negative hematology ROS (+)   Anesthesia Other Findings tiva  Reproductive/Obstetrics                             Anesthesia Physical Anesthesia Plan  ASA: II  Anesthesia Plan: General   Post-op Pain Management:    Induction:   PONV Risk Score and Plan:   Airway Management Planned:   Additional Equipment:   Intra-op Plan:   Post-operative Plan:   Informed Consent: I have reviewed the patients History and Physical, chart, labs and discussed the procedure including the risks, benefits and alternatives for the proposed anesthesia with the patient or authorized representative who has indicated his/her understanding and acceptance.     Plan Discussed with: CRNA  Anesthesia Plan Comments:         Anesthesia Quick Evaluation

## 2017-09-16 ENCOUNTER — Encounter: Payer: Self-pay | Admitting: Gastroenterology

## 2017-09-20 NOTE — Telephone Encounter (Signed)
Error

## 2017-10-11 ENCOUNTER — Other Ambulatory Visit: Payer: Self-pay | Admitting: Internal Medicine

## 2017-10-11 ENCOUNTER — Encounter: Payer: Self-pay | Admitting: Internal Medicine

## 2017-10-11 DIAGNOSIS — I10 Essential (primary) hypertension: Secondary | ICD-10-CM

## 2017-10-11 MED ORDER — FUROSEMIDE 20 MG PO TABS: 20.0000 mg | ORAL_TABLET | Freq: Every day | ORAL | 1 refills | Status: DC | PRN

## 2017-10-11 MED FILL — METOPROLOL SUCC ER 100 MG T: 100 | 90 days supply | Qty: 90 | Fill #0

## 2017-10-11 NOTE — Telephone Encounter (Signed)
Per Patient request only takes PRN.  Please let us know.

## 2017-10-12 MED FILL — FUROSEMIDE 20 MG TABLET: 20 | 30 days supply | Qty: 30 | Fill #0

## 2018-01-11 ENCOUNTER — Other Ambulatory Visit: Payer: Self-pay | Admitting: Internal Medicine

## 2018-01-11 DIAGNOSIS — I10 Essential (primary) hypertension: Secondary | ICD-10-CM

## 2018-01-11 MED FILL — METOPROLOL SUCC ER 100 MG T: 100 | 90 days supply | Qty: 90 | Fill #0

## 2018-03-01 ENCOUNTER — Encounter: Payer: Self-pay | Admitting: Internal Medicine

## 2018-03-01 ENCOUNTER — Ambulatory Visit (INDEPENDENT_AMBULATORY_CARE_PROVIDER_SITE_OTHER): Payer: 59 | Admitting: Internal Medicine

## 2018-03-01 VITALS — BP 122/74 | HR 63 | Temp 98.1°F | Resp 14 | Ht 63.0 in | Wt 184.2 lb

## 2018-03-01 DIAGNOSIS — I10 Essential (primary) hypertension: Secondary | ICD-10-CM

## 2018-03-01 DIAGNOSIS — R635 Abnormal weight gain: Secondary | ICD-10-CM | POA: Diagnosis not present

## 2018-03-01 DIAGNOSIS — E6609 Other obesity due to excess calories: Secondary | ICD-10-CM

## 2018-03-01 DIAGNOSIS — Z6830 Body mass index (BMI) 30.0-30.9, adult: Secondary | ICD-10-CM

## 2018-03-01 DIAGNOSIS — R7301 Impaired fasting glucose: Secondary | ICD-10-CM | POA: Diagnosis not present

## 2018-03-01 DIAGNOSIS — Z0000 Encounter for general adult medical examination without abnormal findings: Secondary | ICD-10-CM

## 2018-03-01 MED ORDER — METOPROLOL SUCCINATE ER 100 MG PO TB24: 100.0000 mg | ORAL_TABLET | Freq: Every day | ORAL | 2 refills | Status: DC

## 2018-03-01 NOTE — Progress Notes (Signed)
Patient ID: Carla Mckinney, female    DOB: 11/03/1961  Age: 57 y.o. MRN: [id number]  The patient is here for annual preventive examination and management of other chronic and acute problems.  Health Maintenance:  Mammogram normal sept 2018 Colonoscopy  sept 2018 S/p TAH  Due for hep c and HIV screening   The risk factors are reflected in the social history.  The roster of all physicians providing medical care to patient - is listed in the Snapshot section of the chart.  Activities of daily living:  The patient is 100% independent in all ADLs: dressing, toileting, feeding as well as independent mobility  Home safety : The patient has smoke detectors in the home. They wear seatbelts.  There are no firearms at home. There is no violence in the home.   There is no risks for hepatitis, STDs or HIV. There is no   history of blood transfusion. They have no travel history to infectious disease endemic areas of the world.  The patient has seen their dentist in the last six month. They have seen their eye doctor in the last year. They deny  hearing difficulty and  have deferred audiologic testing in the last year.  They do not  have excessive sun exposure. Discussed the need for sun protection: hats, long sleeves and use of sunscreen if there is significant sun exposure.   Diet: the importance of a healthy diet is discussed. They have not had a healthy diet.  The benefits of regular aerobic exercise were discussed. She is not exercising  Regularly .   Depression screen: there are no signs or vegative symptoms of depression- irritability, change in appetite, anhedonia, sadness/tearfullness.   The following portions of the patient's history were reviewed and updated as appropriate: allergies, current medications, past family history, past medical history,  past surgical history, past social history  and problem list.  Visual acuity was not assessed per patient preference since she has regular  follow up with her ophthalmologist. Hearing and body mass index were assessed and reviewed.   During the course of the visit the patient was educated and counseled about appropriate screening and preventive services including : fall prevention , diabetes screening, nutrition counseling, colorectal cancer screening, and recommended immunizations.    CC: The primary encounter diagnosis was Impaired fasting glucose. Diagnoses of Weight gain, Essential hypertension, benign, Class 1 obesity due to excess calories without serious comorbidity with body mass index (BMI) of 30.0 to 30.9 in adult, and Encounter for preventive health examination were also pertinent to this visit.  Obesity: She has gained weight steadily over the past 5 years, with a 40 lb weight gain since  2014.  Diet reviewed in detail,  activity level discussed     History Clarie has a past medical history of Hypertension and Rheumatic fever (1986).   She has a past surgical history that includes Tubal ligation; Abdominal hysterectomy; Oophorectomy; Cardiac catheterization (2002); Tonsillectomy; Mastopexy (2008); and Colonoscopy with propofol (N/A, 09/15/2017).   Her family history includes Cancer in her paternal grandfather; Heart disease in her maternal grandfather, mother, and paternal grandmother; Heart failure in her mother; Hyperlipidemia in her father; Hypertension in her father; Stroke (age of onset: 32) in her paternal grandmother.She reports that  has never smoked. she has never used smokeless tobacco. She reports that she drinks about 0.6 oz of alcohol per week. She reports that she does not use drugs.  Outpatient Medications Prior to Visit  Medication Sig Dispense Refill  .  aspirin 81 MG tablet Take 81 mg by mouth daily.    . furosemide (LASIX) 20 MG tablet Take 1 tablet (20 mg total) by mouth daily as needed. 30 tablet 1  . ibuprofen (ADVIL,MOTRIN) 200 MG tablet Take 200 mg by mouth as needed.    . metoprolol succinate  (TOPROL-XL) 100 MG 24 hr tablet TAKE 1 TABLET (100 MG TOTAL) BY MOUTH DAILY. 90 tablet 0   No facility-administered medications prior to visit.     Review of Systems   Patient denies headache, fevers, malaise, unintentional weight loss, skin rash, eye pain, sinus congestion and sinus pain, sore throat, dysphagia,  hemoptysis , cough, dyspnea, wheezing, chest pain, palpitations, orthopnea, edema, abdominal pain, nausea, melena, diarrhea, constipation, flank pain, dysuria, hematuria, urinary  Frequency, nocturia, numbness, tingling, seizures,  Focal weakness, Loss of consciousness,  Tremor, insomnia, depression, anxiety, and suicidal ideation.      Objective:  BP 122/74 (BP Location: Left Arm, Patient Position: Sitting, Cuff Size: Large)   Pulse 63   Temp 98.1 F (36.7 C) (Oral)   Resp 14   Ht 5\' 3"  (1.6 m)   Wt 184 lb 3.2 oz (83.6 kg)   SpO2 98%   BMI 32.63 kg/m   Physical Exam   General appearance: alert, cooperative and appears stated age Head: Normocephalic, without obvious abnormality, atraumatic Eyes: conjunctivae/corneas clear. PERRL, EOM's intact. Fundi benign. Ears: normal TM's and external ear canals both ears Nose: Nares normal. Septum midline. Mucosa normal. No drainage or sinus tenderness. Throat: lips, mucosa, and tongue normal; teeth and gums normal Neck: no adenopathy, no carotid bruit, no JVD, supple, symmetrical, trachea midline and thyroid not enlarged, symmetric, no tenderness/mass/nodules Lungs: clear to auscultation bilaterally Breasts: normal appearance, no masses or tenderness Heart: regular rate and rhythm, S1, S2 normal, no murmur, click, rub or gallop Abdomen: soft, non-tender; bowel sounds normal; no masses,  no organomegaly Extremities: extremities normal, atraumatic, no cyanosis or edema Pulses: 2+ and symmetric Skin: Skin color, texture, turgor normal. No rashes or lesions Neurologic: Alert and oriented X 3, normal strength and tone. Normal  symmetric reflexes. Normal coordination and gait.      Assessment & Plan:   Problem List Items Addressed This Visit    Encounter for preventive health examination    Annual comprehensive preventive exam was done as well as an evaluation and management of chronic conditions .  During the course of the visit the patient was educated and counseled about appropriate screening and preventive services including :  diabetes screening, lipid analysis with projected  10 year  risk for CAD , nutrition counseling, breast, cervical and colorectal cancer screening, and recommended immunizations.  Printed recommendations for health maintenance screenings was given      Essential hypertension, benign    Well controlled on current regimen. Renal function stable, no changes today.      Relevant Medications   metoprolol succinate (TOPROL-XL) 100 MG 24 hr tablet   Obesity    I have addressed  BMI and recommended wt loss of 10% of body weigh over the next 6 months using a low glycemic index diet and regular exercise a minimum of 5 days per week.    Lab Results  Component Value Date   HGBA1C 5.3 03/02/2018   Lab Results  Component Value Date   TSH 3.32 03/02/2018          Other Visit Diagnoses    Impaired fasting glucose    -  Primary   Relevant  Orders   Comprehensive metabolic panel (Completed)   Hemoglobin A1c (Completed)   Weight gain       Relevant Orders   Lipid panel (Completed)   TSH (Completed)      I am having Johnathon D. Grandville Silos maintain her aspirin, ibuprofen, furosemide, and metoprolol succinate.  Meds ordered this encounter  Medications  . metoprolol succinate (TOPROL-XL) 100 MG 24 hr tablet    Sig: Take 1 tablet (100 mg total) by mouth daily.    Dispense:  90 tablet    Refill:  2    Medications Discontinued During This Encounter  Medication Reason  . metoprolol succinate (TOPROL-XL) 100 MG 24 hr tablet Reorder    Follow-up: Return in about 1 year (around  03/02/2019).   Crecencio Mc, MD

## 2018-03-01 NOTE — Patient Instructions (Addendum)
Low carb Breakfast options:  Premier Protein  Atkins Advantage Muscle Milk EAS AdvantEdge   All of these are available at [employer], [place] Faster,  [place] a And taste good   Danton Clap "[employer]'Light" frozen entrees:    Frittata  : muffin shaped quiches that contain eggs + cheese + veggies+ meat (no bread) Egg'wich :  [place] sausage patty served on a biscuit made of frittat (no bread)   Both can be microwaved in 2 minutes   Truett Perna:  Just Crack n Egg: yogurt sized cup ith diced ham, cheese and potatoes: add one egg and microwave for 2 minutes   For lunch:   [employer] bagged salads:  Try  the Asian, the [place],  And the Caesar salads, complete with dressings, nuts and croutons .  Found  in the bagged salad section   Just add a protein (tuna,  Chicken etc ) and omit the won ton /tortilla strips   Limit starches to one or 2 daily if not going to give up potatoes   Food Choices for Gastroesophageal Reflux Disease, Adult When you have gastroesophageal reflux disease (GERD), the foods you eat and your eating habits are very important. Choosing the right foods can help ease your discomfort. What guidelines do I need to follow?  Choose fruits, vegetables, whole grains, and low-fat dairy products.  Choose low-fat meat, fish, and poultry.  Limit fats such as oils, salad dressings, butter, nuts, and avocado.  Keep a food diary. This helps you identify foods that cause symptoms.  Avoid foods that cause symptoms. These may be different for everyone.  Eat small meals often instead of 3 large meals a day.  Eat your meals slowly, in a place where you are relaxed.  Limit fried foods.  Cook foods using methods other than frying.  Avoid drinking alcohol.  Avoid drinking large amounts of liquids with your meals.  Avoid bending over or lying down until 2-3 hours after eating. What foods are not recommended? These are some foods and drinks that may make your symptoms  worse: Vegetables Tomatoes. Tomato juice. Tomato and spaghetti sauce. Chili peppers. Onion and garlic. Horseradish. Fruits Oranges, grapefruit, and lemon (fruit and juice). Meats High-fat meats, fish, and poultry. This includes hot dogs, ribs, ham, sausage, salami, and bacon. Dairy Whole milk and chocolate milk. Sour cream. Cream. Butter. Ice cream. Cream cheese. Drinks Coffee and tea. Bubbly (carbonated) drinks or energy drinks. Condiments Hot sauce. Barbecue sauce. Sweets/Desserts Chocolate and cocoa. Donuts. Peppermint and spearmint. Fats and Oils High-fat foods. This includes [place] fries and potato chips. Other Vinegar. Strong spices. This includes black pepper, white pepper, red pepper, cayenne, curry powder, cloves, ginger, and chili powder. The items listed above may not be a complete list of foods and drinks to avoid. Contact your dietitian for more information. This information is not intended to replace advice given to you by your health care provider. Make sure you discuss any questions you have with your health care provider. Document Released: 06/07/2012 Document Revised: 05/14/2016 Document Reviewed: 10/11/2013 Elsevier Interactive Patient Education  2017 [employer].

## 2018-03-02 ENCOUNTER — Other Ambulatory Visit (INDEPENDENT_AMBULATORY_CARE_PROVIDER_SITE_OTHER): Payer: 59

## 2018-03-02 DIAGNOSIS — R635 Abnormal weight gain: Secondary | ICD-10-CM | POA: Diagnosis not present

## 2018-03-02 DIAGNOSIS — R7301 Impaired fasting glucose: Secondary | ICD-10-CM

## 2018-03-02 LAB — COMPREHENSIVE METABOLIC PANEL
ALBUMIN: 4 g/dL (ref 3.5–5.2)
ALT: 20 U/L (ref 0–35)
AST: 15 U/L (ref 0–37)
Alkaline Phosphatase: 86 U/L (ref 39–117)
BUN: 8 mg/dL (ref 6–23)
CHLORIDE: 107 meq/L (ref 96–112)
CO2: 27 mEq/L (ref 19–32)
Calcium: 10.1 mg/dL (ref 8.4–10.5)
Creatinine, Ser: 0.65 mg/dL (ref 0.40–1.20)
GFR: 100.86 mL/min (ref >60.00)
Glucose, Bld: 97 mg/dL (ref 70–99)
POTASSIUM: 4.4 meq/L (ref 3.5–5.1)
SODIUM: 139 meq/L (ref 135–145)
Total Bilirubin: 0.7 mg/dL (ref 0.2–1.2)
Total Protein: 6.7 g/dL (ref 6.0–8.3)

## 2018-03-02 LAB — LIPID PANEL
CHOLESTEROL: 223 mg/dL — AB (ref 0–200)
HDL: 68.6 mg/dL (ref >39.00)
LDL CALC: 138 mg/dL — AB (ref 0–99)
NonHDL: 153.94
TRIGLYCERIDES: 79 mg/dL (ref 0.0–149.0)
Total CHOL/HDL Ratio: 3
VLDL: 15.8 mg/dL (ref 0.0–40.0)

## 2018-03-02 LAB — HEMOGLOBIN A1C: Hgb A1c MFr Bld: 5.3 % (ref 4.6–6.5)

## 2018-03-02 LAB — TSH: TSH: 3.32 u[IU]/mL (ref 0.35–4.50)

## 2018-03-02 NOTE — Assessment & Plan Note (Signed)
Annual comprehensive preventive exam was done as well as an evaluation and management of chronic conditions .  During the course of the visit the patient was educated and counseled about appropriate screening and preventive services including :  diabetes screening, lipid analysis with projected  10 year  risk for CAD , nutrition counseling, breast, cervical and colorectal cancer screening, and recommended immunizations.  Printed recommendations for health maintenance screenings was given 

## 2018-03-02 NOTE — Assessment & Plan Note (Signed)
Well controlled on current regimen. Renal function stable, no changes today. 

## 2018-03-02 NOTE — Assessment & Plan Note (Signed)
I have addressed  BMI and recommended wt loss of 10% of body weigh over the next 6 months using a low glycemic index diet and regular exercise a minimum of 5 days per week.    Lab Results  Component Value Date   HGBA1C 5.3 03/02/2018   Lab Results  Component Value Date   TSH 3.32 03/02/2018

## 2018-03-14 ENCOUNTER — Encounter: Payer: Self-pay | Admitting: Internal Medicine

## 2018-03-15 MED ORDER — LIRAGLUTIDE -WEIGHT MANAGEMENT 18 MG/3ML ~~LOC~~ SOPN: 0.6000 mg | PEN_INJECTOR | Freq: Every day | SUBCUTANEOUS | 0 refills | Status: DC

## 2018-03-15 NOTE — Telephone Encounter (Signed)
Patient has decided to try Saxenda.  rx written,  plesae obtain PA and call patient to arrange an RN visit if needed for instruction on administering to self.

## 2018-03-23 ENCOUNTER — Encounter: Payer: Self-pay | Admitting: Internal Medicine

## 2018-03-23 NOTE — Telephone Encounter (Signed)
Has this PA been completed or can I help?

## 2018-03-24 ENCOUNTER — Telehealth: Payer: Self-pay

## 2018-03-24 NOTE — Telephone Encounter (Signed)
PA for saxenda has been submitted on covermymeds.  

## 2018-03-29 NOTE — Telephone Encounter (Signed)
Received a fax from [employer] stating that the pt does not meet their guidelines for Saxenda. The fax states that pt must be 1) actively enrolled in an exercise and caloric reduction program or weight loss or behavorial modification program 2) have had a previous trial or contraindication to Contrave 3) and not currently taking a GLP-1 receptor agonist(victoza, Byetta, Bydureon and Tanzeum).

## 2018-03-30 ENCOUNTER — Telehealth: Payer: Self-pay | Admitting: Internal Medicine

## 2018-03-30 NOTE — Telephone Encounter (Signed)
See patient's last e mail.  She was enrolled in weight watchers when the saxenda was requested,  Was that info made available to her insurance when you tried to get PA?  Thanks

## 2018-03-31 ENCOUNTER — Other Ambulatory Visit: Payer: Self-pay | Admitting: Internal Medicine

## 2018-03-31 MED ORDER — NALTREXONE-BUPROPION HCL ER 8-90 MG PO TB12: ORAL_TABLET | ORAL | 0 refills | Status: DC

## 2018-03-31 NOTE — Progress Notes (Signed)
contrave rx sent

## 2018-04-01 NOTE — Telephone Encounter (Signed)
I have filed a written appeal for Saxenda.

## 2018-04-08 ENCOUNTER — Telehealth: Payer: Self-pay | Admitting: Internal Medicine

## 2018-04-08 NOTE — Telephone Encounter (Signed)
Copied from [facility] [phone]. Topic: Quick Communication - See Telephone Encounter >> Apr 08, 2018  1:49 PM R[place]ford Nail, [place] wrote: CRM for notification. See Telephone encounter for: 04/08/18. Benjamine Mola from [facility] calling and states that they have sent 2 prior authorization requests on Contrave. She states the patient is under the impression that this process has been started and she wanted the office to be aware that she spoke with insurance and they told her they have not received anything.  Please advise.

## 2018-04-12 ENCOUNTER — Encounter: Payer: Self-pay | Admitting: Internal Medicine

## 2018-04-12 NOTE — Telephone Encounter (Signed)
PA has been done but it states that there is an error and pt does not have active coverage. Called both the pt and the pharmacy to verify that we had the correct information. Also called covermymeds and they are sending over a form that needs to be filled out. Waiting on form.

## 2018-04-14 MED FILL — METOPROLOL SUCCINATE ER 100: 100 | 90 days supply | Qty: 90 | Fill #0

## 2018-04-18 MED FILL — CONTRAVE ER 8-90 MG TABLET: 8-90 | 1 days supply | Qty: 7 | Fill #0

## 2018-04-19 ENCOUNTER — Telehealth: Payer: Self-pay

## 2018-04-19 NOTE — Telephone Encounter (Signed)
PA has been resubmitted on covermymeds

## 2018-04-19 NOTE — Telephone Encounter (Signed)
PA has been resubmitted on cevermymeds.

## 2018-04-22 MED FILL — CONTRAVE ER 8-90 MG TABLET: 8-90 | 7 days supply | Qty: 14 | Fill #1

## 2018-04-26 DIAGNOSIS — E669 Obesity, unspecified: Secondary | ICD-10-CM

## 2018-04-27 NOTE — Telephone Encounter (Signed)
Contrave has been approved through 09/17/2018. Pt and pharmacy are aware.

## 2018-04-28 MED ORDER — NALTREXONE-BUPROPION HCL ER 8-90 MG PO TB12: ORAL_TABLET | ORAL | 0 refills | Status: DC

## 2018-04-28 MED FILL — CONTRAVE ER 8-90 MG TABLET: 8-90 | 16 days supply | Qty: 57 | Fill #2

## 2018-05-17 ENCOUNTER — Encounter: Payer: Self-pay | Admitting: Internal Medicine

## 2018-05-18 MED FILL — CONTRAVE ER 8-90 MG TABLET: 8-90 | 30 days supply | Qty: 120 | Fill #0

## 2018-06-28 DIAGNOSIS — J029 Acute pharyngitis, unspecified: Secondary | ICD-10-CM | POA: Diagnosis not present

## 2018-06-30 ENCOUNTER — Encounter: Payer: Self-pay | Admitting: Medical

## 2018-06-30 ENCOUNTER — Ambulatory Visit: Payer: Self-pay | Admitting: Medical

## 2018-06-30 VITALS — BP 136/90 | HR 72 | Temp 98.9°F | Wt 175.0 lb

## 2018-06-30 DIAGNOSIS — H1033 Unspecified acute conjunctivitis, bilateral: Secondary | ICD-10-CM

## 2018-06-30 DIAGNOSIS — R0982 Postnasal drip: Secondary | ICD-10-CM

## 2018-06-30 DIAGNOSIS — J029 Acute pharyngitis, unspecified: Secondary | ICD-10-CM

## 2018-06-30 MED ORDER — GENTAMICIN SULFATE 0.3 % OP SOLN: 1.0000 [drp] | OPHTHALMIC | 0 refills | Status: DC

## 2018-06-30 NOTE — Progress Notes (Signed)
Subjective:    Patient ID: Carla Mckinney, female    DOB: 1961/05/30, 57 y.o.   MRN: [id number]  HPI 57 yo in no acute distress. Complains of red eye on right eye  ( "I also feel a little in the left eye") uncomfortable feeling and redness, "matted this morning and discharge on the way to my appointment". Itches a little. Feels full of mucus.  Started Sunday with body aches and low grade fever on Monday, sore throat Monday, Tuesday and Wednesday. Seen at [facility] on Tuesday diagnosed with viral pharyngitis , strep test was negative, "A culture was taken",  hasn't heard from them as of yet.  Still has a little bit of a sore throat, but much improved. Mild cough non productive for the most part and mucus with a little yellow a couple of times. Denies any shortness of breath or chest pain.  Fever on 99.8 on Tuesday but no fever today or yesterday.    Review of Systems  Constitutional: Positive for chills and fever (last fever on Tuesday).  HENT: Positive for postnasal drip, rhinorrhea, sneezing, sore throat and trouble swallowing (3/10 now). Negative for congestion, ear pain, sinus pressure, sinus pain and tinnitus.   Eyes: Positive for photophobia (a little bit today), pain, discharge, redness, itching and visual disturbance (blurry with discharge).  Respiratory: Positive for cough (very little). Negative for shortness of breath and wheezing.   Cardiovascular: Negative for chest pain, palpitations and leg swelling.  Gastrointestinal: Negative for abdominal pain, diarrhea and vomiting.  Endocrine: Negative for polydipsia, polyphagia and polyuria.  Genitourinary: Negative for dysuria.  Musculoskeletal: Negative for myalgias (gone now).  Skin: Negative for rash.  Allergic/Immunologic: Negative for environmental allergies and food allergies.  Neurological: Negative for dizziness, syncope, light-headedness and headaches.  Hematological: Negative for adenopathy.  Psychiatric/Behavioral:  Negative for behavioral problems, confusion, self-injury and suicidal ideas. The patient is not nervous/anxious.   tested for tree allergies 25 years ago  Usually had a headache with that. No headache now Hx of Rhematic Fever 1985.  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    Objective:   Physical Exam  Constitutional: She is oriented to person, place, and time. She appears well-developed and well-nourished.  HENT:  Head: Normocephalic and atraumatic.  Right Ear: Hearing, external ear and ear canal normal. A middle ear effusion is present.  Left Ear: Hearing, external ear and ear canal normal. A middle ear effusion (vascular looking too.) is present.  Nose: Mucosal edema and rhinorrhea present.  Mouth/Throat: Uvula is midline and mucous membranes are normal. Posterior oropharyngeal erythema (mild) present. Tonsils are 1+ on the right. Tonsils are 1+ on the left.  Eyes: Pupils are equal, round, and reactive to light. EOM are normal. Right eye exhibits discharge. Left eye exhibits no discharge.  Neck: Normal range of motion. Neck supple.  Cardiovascular: Normal rate, regular rhythm and normal heart sounds.  Pulmonary/Chest: Effort normal and breath sounds normal.  Lymphadenopathy:    She has cervical adenopathy.  Neurological: She is alert and oriented to person, place, and time.  Skin: Skin is warm and dry.  Psychiatric: She has a normal mood and affect. Her behavior is normal. Judgment and thought content normal.  Nursing note and vitals reviewed.         Assessment & Plan:  Conjunctivitis bilateral   Right worse than left. Viral pharyngitis  PND ( post nasal drip) recommended patient try OTC Zyrtec, or Claritin or Allegra daily. Note given to patient for  Friday. Meds ordered this encounter  Medications  . gentamicin (GARAMYCIN) 0.3 % ophthalmic solution    Sig: Place 1 drop into both eyes every 4 (four) hours. While awake. For  5-7 days.    Dispense:  5 mL    Refill:  0  Hygiene  reviewed with patient. Return to the clinic if not improving in  3 -5 days. Patient verbalizes understanding and has no questions at discharge.

## 2018-06-30 NOTE — Patient Instructions (Addendum)
Wash hands often, and before and after using medication.  Return to the clinic in  3-5 days if not improving.   Bacterial Conjunctivitis Bacterial conjunctivitis is an infection of your conjunctiva. This is the clear membrane that covers the white part of your eye and the inner surface of your eyelid. This condition can make your eye:  Red or pink.  Itchy.  This condition is caused by bacteria. This condition spreads very easily from person to person (is contagious) and from one eye to the other eye. Follow these instructions at home: Medicines  Take or apply your antibiotic medicine as told by your doctor. Do not stop taking or applying the antibiotic even if you start to feel better.  Take or apply over-the-counter and prescription medicines only as told by your doctor.  Do not touch your eyelid with the eye drop bottle or the ointment tube. Managing discomfort  Wipe any fluid from your eye with a warm, wet washcloth or a cotton ball.  Place a cool, clean washcloth on your eye. Do this for 10-20 minutes, 3-4 times per day. General instructions  Do not wear contact lenses until the irritation is gone. Wear glasses until your doctor says it is okay to wear contacts.  Do not wear eye makeup until your symptoms are gone. Throw away any old makeup.  Change or wash your pillowcase every day.  Do not share towels or washcloths with anyone.  Wash your hands often with soap and water. Use paper towels to dry your hands.  Do not touch or rub your eyes.  Do not drive or use heavy machinery if your vision is blurry. Contact a doctor if:  You have a fever.  Your symptoms do not get better after 10 days. Get help right away if:  You have a fever and your symptoms suddenly get worse.  You have very bad pain when you move your eye.  Your face: ? Hurts. ? Is red. ? Is swollen.  You have sudden loss of vision. This information is not intended to replace advice given to you by  your health care provider. Make sure you discuss any questions you have with your health care provider. Document Released: 09/15/2008 Document Revised: 05/14/2016 Document Reviewed: 09/19/2015 Elsevier Interactive Patient Education  2018 [employer]. Viral Illness, Adult Viruses are tiny germs that can get into a person's body and cause illness. There are many different types of viruses, and they cause many types of illness. Viral illnesses can range from mild to severe. They can affect various parts of the body. Common illnesses that are caused by a virus include colds and the flu. Viral illnesses also include serious conditions such as HIV/AIDS (human immunodeficiency virus/acquired immunodeficiency syndrome). A few viruses have been linked to certain cancers. What are the causes? Many types of viruses can cause illness. Viruses invade cells in your body, multiply, and cause the infected cells to malfunction or die. When the cell dies, it releases more of the virus. When this happens, you develop symptoms of the illness, and the virus continues to spread to other cells. If the virus takes over the function of the cell, it can cause the cell to divide and grow out of control, as is the case when a virus causes cancer. Different viruses get into the body in different ways. You can get a virus by:  Swallowing food or water that is contaminated with the virus.  Breathing in droplets that have been coughed or sneezed  into the air by an infected person.  Touching a surface that has been contaminated with the virus and then touching your eyes, nose, or mouth.  Being bitten by an insect or animal that carries the virus.  Having sexual contact with a person who is infected with the virus.  Being exposed to blood or fluids that contain the virus, either through an open cut or during a transfusion.  If a virus enters your body, your body's defense system (immune system) will try to fight the virus.  You may be at higher risk for a viral illness if your immune system is weak. What are the signs or symptoms? Symptoms vary depending on the type of virus and the location of the cells that it invades. Common symptoms of the main types of viral illnesses include: Cold and flu viruses  Fever.  Headache.  Sore throat.  Muscle aches.  Nasal congestion.  Cough. Digestive system (gastrointestinal) viruses  Fever.  Abdominal pain.  Nausea.  Diarrhea. Liver viruses (hepatitis)  Loss of appetite.  Tiredness.  Yellowing of the skin (jaundice). Brain and spinal cord viruses  Fever.  Headache.  Stiff neck.  Nausea and vomiting.  Confusion or sleepiness. Skin viruses  Warts.  Itching.  Rash. Sexually transmitted viruses  Discharge.  Swelling.  Redness.  Rash. How is this treated? Viruses can be difficult to treat because they live within cells. Antibiotic medicines do not treat viruses because these drugs do not get inside cells. Treatment for a viral illness may include:  Resting and drinking plenty of fluids.  Medicines to relieve symptoms. These can include over-the-counter medicine for pain and fever, medicines for cough or congestion, and medicines to relieve diarrhea.  Antiviral medicines. These drugs are available only for certain types of viruses. They may help reduce flu symptoms if taken early. There are also many antiviral medicines for hepatitis and HIV/AIDS.  Some viral illnesses can be prevented with vaccinations. A common example is the flu shot. Follow these instructions at home: Medicines   Take over-the-counter and prescription medicines only as told by your health care provider.  If you were prescribed an antiviral medicine, take it as told by your health care provider. Do not stop taking the medicine even if you start to feel better.  Be aware of when antibiotics are needed and when they are not needed. Antibiotics do not treat  viruses. If your health care provider thinks that you may have a bacterial infection as well as a viral infection, you may get an antibiotic. ? Do not ask for an antibiotic prescription if you have been diagnosed with a viral illness. That will not make your illness go away faster. ? Frequently taking antibiotics when they are not needed can lead to antibiotic resistance. When this develops, the medicine no longer works against the bacteria that it normally fights. General instructions  Drink enough fluids to keep your urine clear or pale yellow.  Rest as much as possible.  Return to your normal activities as told by your health care provider. Ask your health care provider what activities are safe for you.  Keep all follow-up visits as told by your health care provider. This is important. How is this prevented? Take these actions to reduce your risk of viral infection:  Eat a healthy diet and get enough rest.  Wash your hands often with soap and water. This is especially important when you are in public places. If soap and water are not available, use  hand sanitizer.  Avoid close contact with friends and family who have a viral illness.  If you travel to areas where viral gastrointestinal infection is common, avoid drinking water or eating raw food.  Keep your immunizations up to date. Get a flu shot every year as told by your health care provider.  Do not share toothbrushes, nail clippers, razors, or needles with other people.  Always practice safe sex.  Contact a health care provider if:  You have symptoms of a viral illness that do not go away.  Your symptoms come back after going away.  Your symptoms get worse. Get help right away if:  You have trouble breathing.  You have a severe headache or a stiff neck.  You have severe vomiting or abdominal pain. This information is not intended to replace advice given to you by your health care provider. Make sure you discuss any  questions you have with your health care provider. Document Released: 04/17/2016 Document Revised: 05/20/2016 Document Reviewed: 04/17/2016 Elsevier Interactive Patient Education  2018 [place] Drip Postnasal drip is the feeling of mucus going down the back of your throat. Mucus is a slimy substance that moistens and cleans your nose and throat, as well as the air pockets in face bones near your forehead and cheeks (sinuses). Small amounts of mucus pass from your nose and sinuses down the back of your throat all the time. This is normal. When you produce too much mucus or the mucus gets too thick, you can feel it. Some common causes of postnasal drip include: Having more mucus because of: A cold or the flu. Allergies. Cold air. Certain medicines. Having more mucus that is thicker because of: A sinus or nasal infection. Dry air. A food allergy.  Follow these instructions at home: Relieving discomfort Gargle with a salt-water mixture 3-4 times a day or as needed. To make a salt-water mixture, completely dissolve -1 tsp of salt in 1 cup of warm water. If the air in your home is dry, use a humidifier to add moisture to the air. Use a saline spray or container (neti pot) to flush out the nose (nasal irrigation). These methods can help clear away mucus and keep the nasal passages moist. General instructions Take over-the-counter and prescription medicines only as told by your health care provider. Follow instructions from your health care provider about eating or drinking restrictions. You may need to avoid caffeine. Avoid things that you know you are allergic to (allergens), like dust, mold, pollen, pets, or certain foods. Drink enough fluid to keep your urine pale yellow. Keep all follow-up visits as told by your health care provider. This is important. Contact a health care provider if: You have a fever. You have a sore throat. You have difficulty swallowing. You have  headache. You have sinus pain. You have a cough that does not go away. The mucus from your nose becomes thick and is green or yellow in color. You have cold or flu symptoms that last more than 10 days. Summary Postnasal drip is the feeling of mucus going down the back of your throat. If your health care provider approves, use nasal irrigation or a nasal spray 2?4 times a day. Avoid things that you know you are allergic to (allergens), like dust, mold, pollen, pets, or certain foods. This information is not intended to replace advice given to you by your health care provider. Make sure you discuss any questions you have with your health care provider.  Document Released: 03/22/2017 Document Revised: 03/22/2017 Document Reviewed: 03/22/2017 Elsevier Interactive Patient Education  [employer].

## 2018-07-04 ENCOUNTER — Telehealth: Payer: Self-pay | Admitting: Emergency Medicine

## 2018-07-04 NOTE — Telephone Encounter (Signed)
Left message follow up visit with [facility]  

## 2018-07-05 ENCOUNTER — Ambulatory Visit: Payer: Self-pay | Admitting: Physician Assistant

## 2018-07-05 ENCOUNTER — Telehealth: Payer: Self-pay | Admitting: Physician Assistant

## 2018-07-05 VITALS — BP 150/92 | HR 61 | Temp 98.2°F | Wt 181.0 lb

## 2018-07-05 DIAGNOSIS — J069 Acute upper respiratory infection, unspecified: Secondary | ICD-10-CM

## 2018-07-05 MED ORDER — PREDNISONE 20 MG PO TABS: ORAL_TABLET | ORAL | 0 refills | Status: AC

## 2018-07-05 NOTE — Progress Notes (Signed)
07/05/2018 10:07 AM   DOB: [birth year] / MRN: [id number]  SUBJECTIVE:   Carla Mckinney is a 58 y.o. female presenting for sore throat, conjunctivitis, cough, nasal congestion. Symptoms present for 10 days now.  The problem is waxing and waning. She has tried eye drops, urgent care visits, and otc pain relievers and cold preps with transient relief.  Denies chest pain, SOB, DOE, leg swelling, orthopnea, history of diabetes. .  She is allergic to lisinopril.   She  has a past medical history of Hypertension and Rheumatic fever (1986).    She  reports that she has never smoked. She has never used smokeless tobacco. She reports that she drinks about 0.6 oz of alcohol per week. She reports that she does not use drugs. She  reports that she currently engages in sexual activity. The patient  has a past surgical history that includes Tubal ligation; Abdominal hysterectomy; Oophorectomy; Cardiac catheterization (2002); Tonsillectomy; Mastopexy (2008); and Colonoscopy with propofol (N/A, 09/15/2017).  Her family history includes Cancer in her paternal grandfather; Heart disease in her maternal grandfather, mother, and paternal grandmother; Heart failure in her mother; Hyperlipidemia in her father; Hypertension in her father; Stroke (age of onset: 81) in her paternal grandmother.  Review of Systems  Constitutional: Negative for chills, diaphoresis and fever.  Eyes: Positive for discharge and redness. Negative for blurred vision, double vision, photophobia and pain.  Respiratory: Positive for cough. Negative for hemoptysis, sputum production, shortness of breath and wheezing.   Cardiovascular: Negative for chest pain, orthopnea and leg swelling.  Gastrointestinal: Negative for nausea.  Skin: Negative for rash.  Neurological: Negative for dizziness, sensory change, speech change, focal weakness and headaches.    The problem list and medications were reviewed and updated by myself where necessary and  exist elsewhere in the encounter.   OBJECTIVE:  BP (!) 150/92   Pulse 61   Temp 98.2 F (36.8 C)   Wt 181 lb (82.1 kg)   SpO2 98%   BMI 32.06 kg/m   Wt Readings from Last 3 Encounters:  07/05/18 181 lb (82.1 kg)  06/30/18 175 lb (79.4 kg)  03/01/18 184 lb 3.2 oz (83.6 kg)   Temp Readings from Last 3 Encounters:  07/05/18 98.2 F (36.8 C)  06/30/18 98.9 F (37.2 C)  03/01/18 98.1 F (36.7 C) (Oral)   BP Readings from Last 3 Encounters:  07/05/18 (!) 150/92  06/30/18 136/90  03/01/18 122/74   Pulse Readings from Last 3 Encounters:  07/05/18 61  06/30/18 72  03/01/18 63    Physical Exam  Constitutional: She is oriented to person, place, and time. She appears well-nourished. No distress.  Eyes: Pupils are equal, round, and reactive to light. EOM and lids are normal. Right conjunctiva is injected. Right conjunctiva has no hemorrhage. Left conjunctiva is injected. Left conjunctiva has no hemorrhage. Right eye exhibits normal extraocular motion and no nystagmus. Left eye exhibits normal extraocular motion and no nystagmus. Right pupil is round and reactive. Left pupil is round and reactive. Pupils are equal.  Cardiovascular: Normal rate, regular rhythm, S1 normal, S2 normal, normal heart sounds and intact distal pulses. Exam reveals no gallop, no friction rub and no decreased pulses.  No murmur heard. Pulmonary/Chest: Effort normal. No stridor. No respiratory distress. She has no wheezes. She has no rales.  Abdominal: She exhibits no distension.  Musculoskeletal: She exhibits no edema.  Neurological: She is alert and oriented to person, place, and time. No cranial nerve deficit. Gait  normal.  Skin: Skin is dry. She is not diaphoretic.  Psychiatric: She has a normal mood and affect.  Vitals reviewed.   Lab Results  Component Value Date   HGBA1C 5.3 03/02/2018    Lab Results  Component Value Date   WBC 6.0 12/24/2016   HGB 14.6 12/24/2016   HCT 42.9 12/24/2016    MCV 91.5 12/24/2016   PLT 272.0 12/24/2016    Lab Results  Component Value Date   CREATININE 0.65 03/02/2018   BUN 8 03/02/2018   NA 139 03/02/2018   K 4.4 03/02/2018   CL 107 03/02/2018   CO2 27 03/02/2018    Lab Results  Component Value Date   ALT 20 03/02/2018   AST 15 03/02/2018   ALKPHOS 86 03/02/2018   BILITOT 0.7 03/02/2018    Lab Results  Component Value Date   TSH 3.32 03/02/2018    Lab Results  Component Value Date   CHOL 223 (H) 03/02/2018   HDL 68.60 03/02/2018   LDLCALC 138 (H) 03/02/2018   LDLDIRECT 154.1 02/23/2013   TRIG 79.0 03/02/2018   CHOLHDL 3 03/02/2018     ASSESSMENT AND PLAN:  Carla Mckinney was seen today for save-eye issue.  Diagnoses and all orders for this visit:  Protracted URI Comments: Likely adenovirus given cold l like symptoms and conjunctivitis. Given prolonged course will start a PO steroid.     The patient is advised to call or return to clinic if she does not see an improvement in symptoms, or to seek the care of the closest emergency department if she worsens with the above plan.   Philis Fendt, MHS, PA-C [facility] 07/05/2018 10:07 AM

## 2018-07-05 NOTE — Telephone Encounter (Signed)
[facility] VISIT ON SAME DAY PRED SENT IN VIA PHONE ENCOUNTER DUE TO Epic ISSUES. Philis Fendt, MS, PA-C 10:17 AM, 07/05/2018

## 2018-07-08 ENCOUNTER — Telehealth: Payer: Self-pay | Admitting: Emergency Medicine

## 2018-07-08 MED FILL — METOPROLOL SUCCINATE ER 100: 100 | 90 days supply | Qty: 90 | Fill #1

## 2018-07-08 NOTE — Telephone Encounter (Signed)
Spoke with patient whom stated that she is doing so much better.

## 2018-07-28 IMAGING — MG 2D DIGITAL SCREENING BILATERAL MAMMOGRAM WITH CAD AND ADJUNCT TO
8 of 12 series · 8 of 28 positions shown · non-contrast
Comparison: Previous exam(s).

CLINICAL DATA: Screening.

EXAM:
2D DIGITAL SCREENING BILATERAL MAMMOGRAM WITH CAD AND ADJUNCT TOMO

[R CC synth-2D]
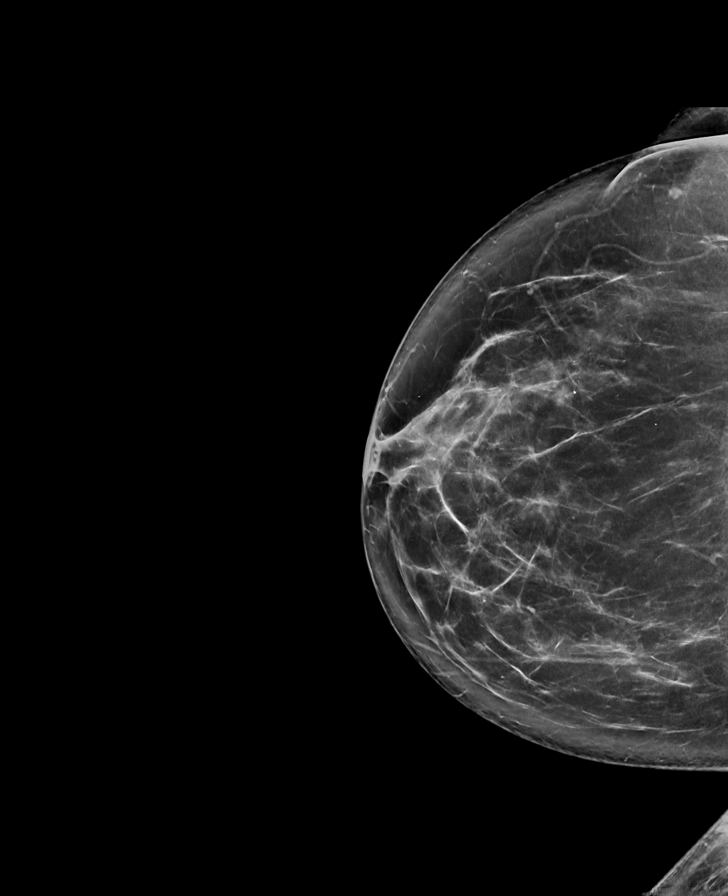

[L MLO]
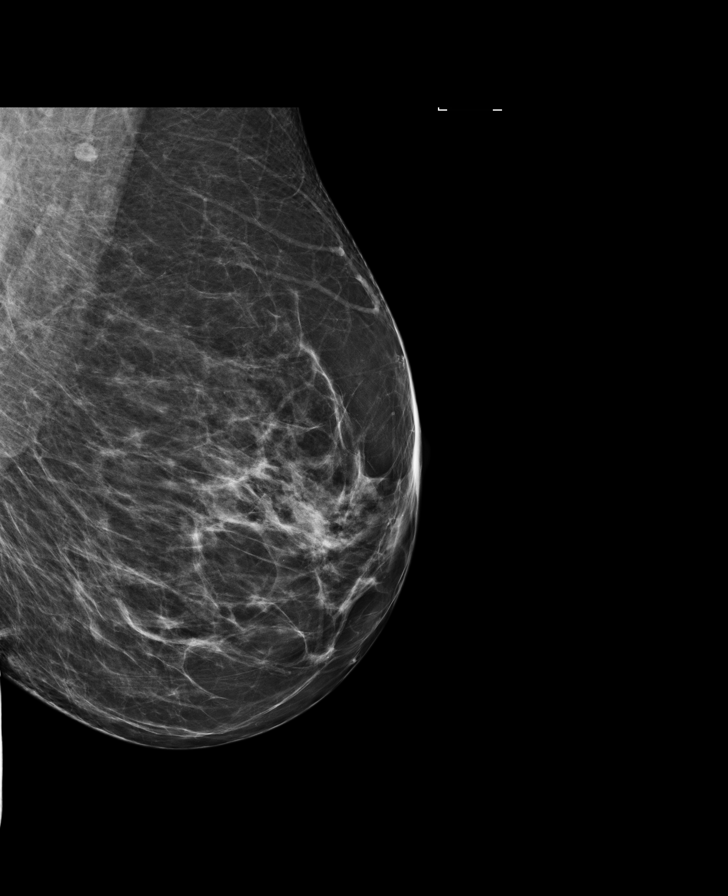

[R MLO]
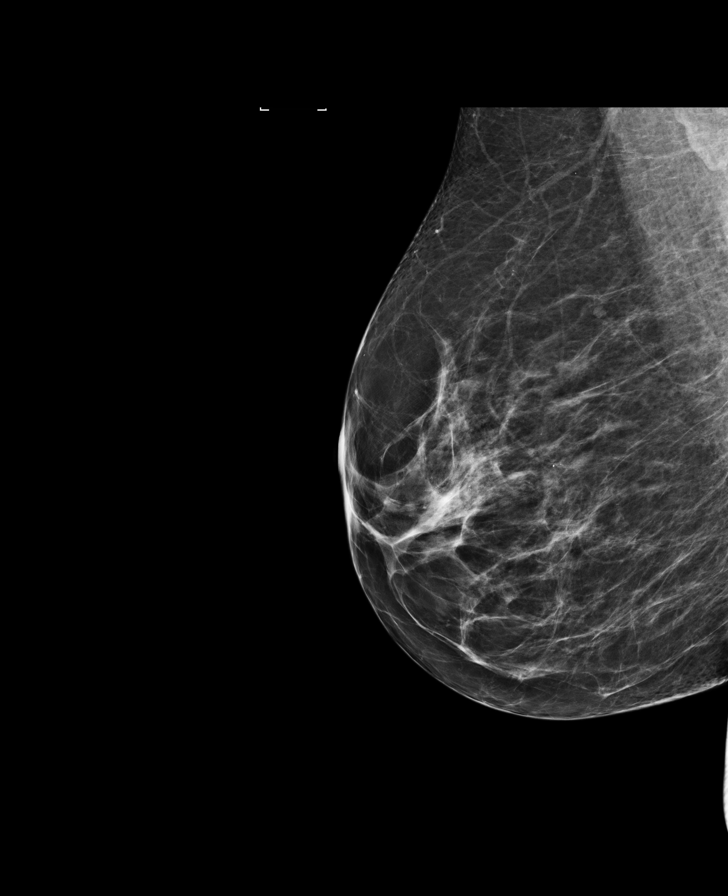

[R MLO synth-2D]
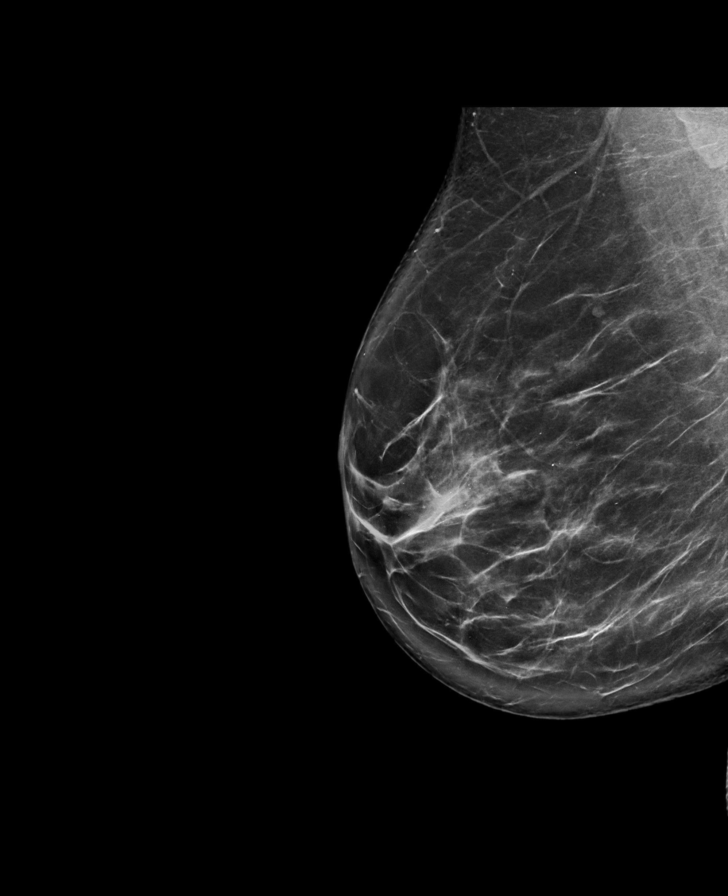

[R CC]
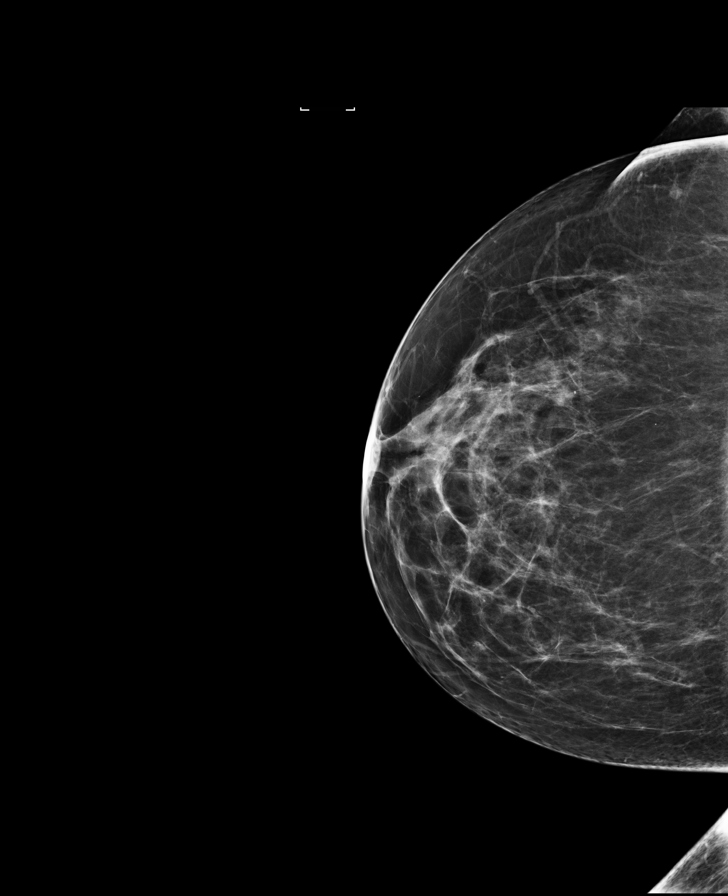

[L MLO synth-2D]
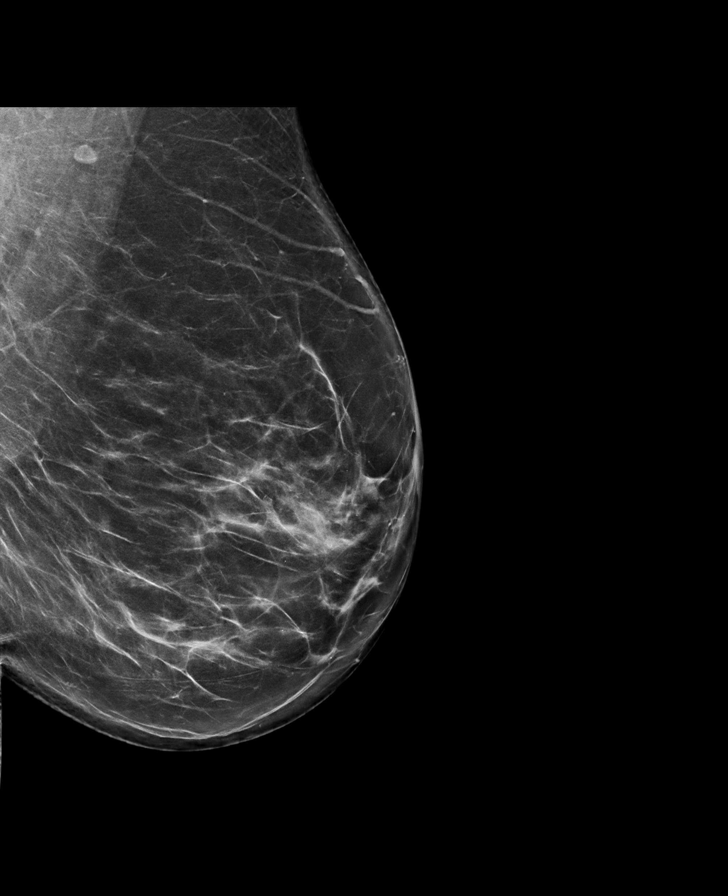

[L CC synth-2D]
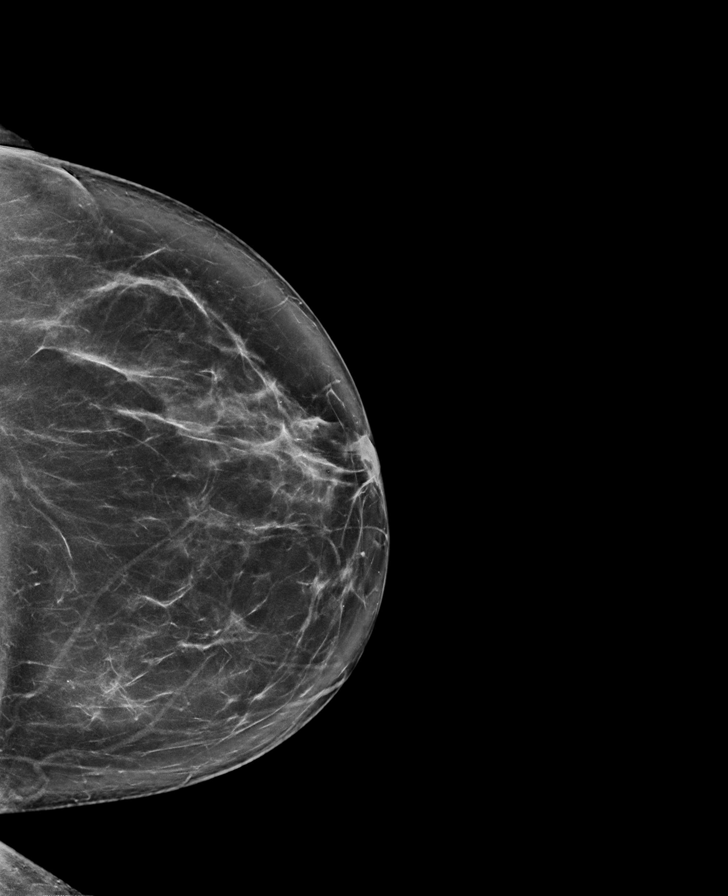

[L CC]
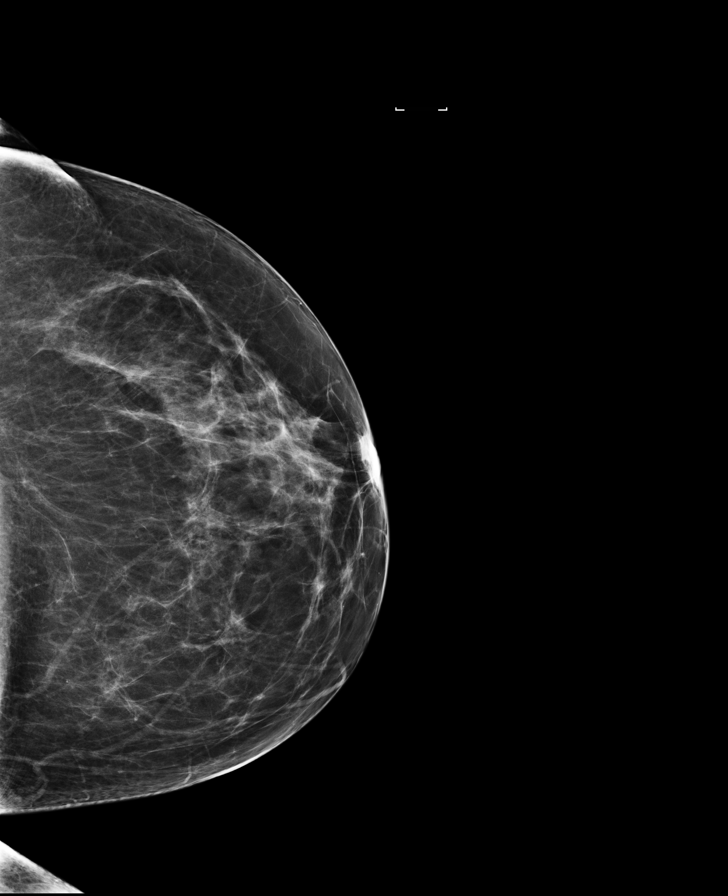

[8 of 28 positions shown; findings below may reference images not displayed]

ACR Breast Density Category c: The breast tissue is heterogeneously
dense, which may obscure small masses.
FINDINGS: There are no findings suspicious for malignancy. Images were
processed with CAD.
IMPRESSION: No mammographic evidence of malignancy. A result letter of this
screening mammogram will be mailed directly to the patient.

RECOMMENDATION:
Screening mammogram in one year. (Code:[id number])

BI-RADS CATEGORY  1: Negative.

## 2018-10-12 MED FILL — METOPROLOL SUCCINATE ER 100: 100 | 90 days supply | Qty: 90 | Fill #2

## 2018-11-08 ENCOUNTER — Other Ambulatory Visit: Payer: Self-pay | Admitting: Internal Medicine

## 2018-11-08 DIAGNOSIS — Z1231 Encounter for screening mammogram for malignant neoplasm of breast: Secondary | ICD-10-CM

## 2018-11-10 ENCOUNTER — Ambulatory Visit
Admission: RE | Admit: 2018-11-10 | Discharge: 2018-11-10 | Disposition: A | Discharge status: Home / Self Care | Payer: 59 | Source: Ambulatory Visit | Attending: Internal Medicine | Admitting: Internal Medicine

## 2018-11-10 DIAGNOSIS — Z1231 Encounter for screening mammogram for malignant neoplasm of breast: Secondary | ICD-10-CM | POA: Diagnosis not present

## 2018-11-14 NOTE — Telephone Encounter (Signed)
Spoke with pt and she has been scheduled for 11/21/2018 @ 4:30pm. Pt is aware of appt date and time.

## 2018-11-21 ENCOUNTER — Ambulatory Visit (INDEPENDENT_AMBULATORY_CARE_PROVIDER_SITE_OTHER): Payer: 59 | Admitting: Internal Medicine

## 2018-11-21 ENCOUNTER — Encounter: Payer: Self-pay | Admitting: Internal Medicine

## 2018-11-21 ENCOUNTER — Ambulatory Visit (INDEPENDENT_AMBULATORY_CARE_PROVIDER_SITE_OTHER): Payer: 59

## 2018-11-21 VITALS — BP 116/74 | HR 61 | Temp 98.1°F | Resp 14 | Ht 63.0 in | Wt 178.0 lb

## 2018-11-21 DIAGNOSIS — R1310 Dysphagia, unspecified: Secondary | ICD-10-CM | POA: Diagnosis not present

## 2018-11-21 DIAGNOSIS — R0683 Snoring: Secondary | ICD-10-CM | POA: Diagnosis not present

## 2018-11-21 DIAGNOSIS — M542 Cervicalgia: Secondary | ICD-10-CM

## 2018-11-21 DIAGNOSIS — M47812 Spondylosis without myelopathy or radiculopathy, cervical region: Secondary | ICD-10-CM | POA: Diagnosis not present

## 2018-11-21 MED ORDER — CYCLOBENZAPRINE HCL 10 MG PO TABS: 10.0000 mg | ORAL_TABLET | Freq: Three times a day (TID) | ORAL | 0 refills | Status: DC | PRN

## 2018-11-21 MED FILL — CYCLOBENZAPRINE 10 MG TAB: 10 | 10 days supply | Qty: 30 | Fill #0

## 2018-11-21 NOTE — Patient Instructions (Signed)
Since your pain has resolved currently, I recommend buying a chin strap if you continue to wake up with dry mouth  If the pain returns,  I'll refer you for endoscopy  Your severe neck pain several days ago may have been due to a muscle spasm.  I sent a muscle relaxer to your pharmacy to use if it happens again

## 2018-11-21 NOTE — Progress Notes (Signed)
Subjective:  Patient ID: Carla Mckinney, female    DOB: 1961/09/19  Age: 57 y.o. MRN: [id number]  CC: The primary encounter diagnosis was Neck pain. Diagnoses of Odynophagia, Snoring, and Anterior neck pain were also pertinent to this visit.  HPI Carla Mckinney presents for evaluation of painful swallowing.  The symptoms have been present for the past month and were not preceded by a choking or coughing episode, and she has not been taking steroids or using steroid inhalers.  The pain is aggravated by swallowing saliva and is not as severe when swallowing food.   It spontaneously resolvedabout 24 hours after having an episode of severe left sided anterior neck pain  That occurred 4 days ago while standing outside.  The pain was so severe she considered going to the ER.  It lasted 90 minutes and was  aggravated by talking .  The next night  all symptoms had improved and currently having no pain   She does not snore but wakes up with dry mouth on a regular basis.   Outpatient Medications Prior to Visit  Medication Sig Dispense Refill  . aspirin 81 MG tablet Take 81 mg by mouth daily.    . furosemide (LASIX) 20 MG tablet Take 1 tablet (20 mg total) by mouth daily as needed. 30 tablet 1  . ibuprofen (ADVIL,MOTRIN) 200 MG tablet Take 200 mg by mouth as needed.    . metoprolol succinate (TOPROL-XL) 100 MG 24 hr tablet Take 1 tablet (100 mg total) by mouth daily. 90 tablet 2  . gentamicin (GARAMYCIN) 0.3 % ophthalmic solution Place 1 drop into both eyes every 4 (four) hours. While awake. For  5-7 days. (Patient not taking: Reported on 11/21/2018) 5 mL 0  . Naltrexone-buPROPion HCl ER 8-90 MG TB12 One tablet every morning for one week, then twice daily for one week.. Increase gradually to 2 tablets twice daily (Patient not taking: Reported on 06/30/2018) 120 tablet 0   No facility-administered medications prior to visit.     Review of Systems;  Patient denies headache, fevers, malaise,  unintentional weight loss, skin rash, eye pain, sinus congestion and sinus pain,  dysphagia,  hemoptysis , cough, dyspnea, wheezing, chest pain, palpitations, orthopnea, edema, abdominal pain, nausea, melena, diarrhea, constipation, flank pain, dysuria, hematuria, urinary  Frequency, nocturia, numbness, tingling, seizures,  Focal weakness, Loss of consciousness,  Tremor, insomnia, depression, anxiety, and suicidal ideation.      Objective:  BP 116/74 (BP Location: Left Arm, Patient Position: Sitting, Cuff Size: Normal)   Pulse 61   Temp 98.1 F (36.7 C) (Oral)   Resp 14   Ht 5\' 3"  (1.6 m)   Wt 178 lb (80.7 kg)   SpO2 98%   BMI 31.53 kg/m   BP Readings from Last 3 Encounters:  11/21/18 116/74  07/05/18 (!) 150/92  06/30/18 136/90    Wt Readings from Last 3 Encounters:  11/21/18 178 lb (80.7 kg)  07/05/18 181 lb (82.1 kg)  06/30/18 175 lb (79.4 kg)    General appearance: alert, cooperative and appears stated age Ears: normal TM's and external ear canals both ears Throat: lips, mucosa, and tongue normal; teeth and gums normal Neck: no cervical lymphadenopathy, no carotid bruit, supple, symmetrical, trachea midline and thyroid not enlarged.  Manubrial  fullness noted , non tender , no discrete mass/nodules Back: symmetric, no curvature. ROM normal. No CVA tenderness. Lungs: clear to auscultation bilaterally Heart: regular rate and rhythm, S1, S2 normal, no murmur, click,  rub or gallop Abdomen: soft, non-tender; bowel sounds normal; no masses,  no organomegaly Pulses: 2+ and symmetric Skin: Skin color, texture, turgor normal. No rashes or lesions Lymph nodes: Cervical, supraclavicular, and axillary nodes normal.    Assessment & Plan:   Problem List Items Addressed This Visit    Anterior neck pain    Mild degeneartive changes and spondylosis without osteophyte formation noted on plain films today       Odynophagia    Symptoms were present for one month and then resolved  gradually after a 90 minute of severe neck pain that was aggravated by vocalizing.  Etiology unclear , unless she had some kind of cyst that ruptured .the pain was not midline, so a thyrogolossal duct cyst was unlikely . Recommend seeing ENT if pain returns.       Snoring    Recommend use of a chin strap at night        Other Visit Diagnoses    Neck pain    -  Primary   Relevant Orders   DG Cervical Spine Complete (Completed)      I have discontinued Yasaman D. Taflinger's Naltrexone-buPROPion HCl ER and gentamicin. I am also having her start on cyclobenzaprine. Additionally, I am having her maintain her aspirin, ibuprofen, furosemide, and metoprolol succinate.  Meds ordered this encounter  Medications  . cyclobenzaprine (FLEXERIL) 10 MG tablet    Sig: Take 1 tablet (10 mg total) by mouth 3 (three) times daily as needed for muscle spasms.    Dispense:  30 tablet    Refill:  0    Medications Discontinued During This Encounter  Medication Reason  . gentamicin (GARAMYCIN) 0.3 % ophthalmic solution Patient has not taken in last 30 days  . Naltrexone-buPROPion HCl ER 8-90 MG TB12 Patient has not taken in last 30 days    Follow-up: No follow-ups on file.   Crecencio Mc, MD

## 2018-11-22 DIAGNOSIS — M542 Cervicalgia: Secondary | ICD-10-CM | POA: Insufficient documentation

## 2018-11-22 DIAGNOSIS — R0683 Snoring: Secondary | ICD-10-CM | POA: Insufficient documentation

## 2018-11-22 DIAGNOSIS — R1310 Dysphagia, unspecified: Secondary | ICD-10-CM | POA: Insufficient documentation

## 2018-11-22 NOTE — Assessment & Plan Note (Signed)
Symptoms were present for one month and then resolved gradually after a 90 minute of severe neck pain that was aggravated by vocalizing.  Etiology unclear , unless she had some kind of cyst that ruptured .the pain was not midline, so a thyrogolossal duct cyst was unlikely . Recommend seeing ENT if pain returns.

## 2018-11-22 NOTE — Assessment & Plan Note (Signed)
Recommend use of a chin strap at night

## 2018-11-22 NOTE — Assessment & Plan Note (Signed)
Mild degeneartive changes and spondylosis without osteophyte formation noted on plain films today

## 2019-01-11 ENCOUNTER — Other Ambulatory Visit: Payer: Self-pay | Admitting: Internal Medicine

## 2019-01-11 DIAGNOSIS — I10 Essential (primary) hypertension: Secondary | ICD-10-CM

## 2019-01-11 MED ORDER — METOPROLOL SUCCINATE ER 100 MG PO TB24: 100.0000 mg | ORAL_TABLET | Freq: Every day | ORAL | 0 refills | Status: DC

## 2019-01-11 MED FILL — METOPROLOL SUCCINATE ER 100: 100 | 90 days supply | Qty: 90 | Fill #0

## 2019-01-11 NOTE — Telephone Encounter (Signed)
Copied from [place]. Topic: Quick Communication - Rx Refill/Question >> Jan 11, 2019  9:13 AM Bea Graff, NT wrote: Medication: metoprolol succinate (TOPROL-XL) 100 MG 24 hr tablet  Has the patient contacted their pharmacy? Yes.   (Agent: If no, request that the patient contact the pharmacy for the refill.) (Agent: If yes, when and what did the pharmacy advise?)  Preferred Pharmacy (with phone number or street name): [place], [place] - [address] [phone] (Phone) [phone] (Fax)    Agent: Please be advised that RX refills may take up to 3 business days. We ask that you follow-up with your pharmacy.

## 2019-01-11 NOTE — Telephone Encounter (Signed)
Requested Prescriptions  Pending Prescriptions Disp Refills  . metoprolol succinate (TOPROL-XL) 100 MG 24 hr tablet 90 tablet 0    Sig: Take 1 tablet (100 mg total) by mouth daily.     Cardiovascular:  Beta Blockers Passed - 01/11/2019  9:16 AM      Passed - Last BP in normal range    BP Readings from Last 1 Encounters:  11/21/18 116/74         Passed - Last Heart Rate in normal range    Pulse Readings from Last 1 Encounters:  11/21/18 61         Passed - Valid encounter within last 6 months    Recent Outpatient Visits          1 month ago Neck pain   [place], MD   10 months ago Impaired fasting glucose   [facility] Crecencio Mc, MD   2 years ago Hyperlipidemia with target LDL less than [place], MD   2 years ago Left wrist pain   [facility] [place] Leone Haven, MD   2 years ago Breast cancer screening   [place], [place], MD      Future Appointments            In 2 months Derrel Nip, Aris Everts, MD [facility], [facility]

## 2019-02-01 ENCOUNTER — Other Ambulatory Visit: Payer: Self-pay | Admitting: Internal Medicine

## 2019-02-01 MED ORDER — FUROSEMIDE 20 MG PO TABS: 20.0000 mg | ORAL_TABLET | Freq: Every day | ORAL | 1 refills | Status: DC | PRN

## 2019-02-01 MED FILL — FUROSEMIDE 20 MG TABS: 20 | 30 days supply | Qty: 30 | Fill #0

## 2019-02-01 NOTE — Telephone Encounter (Signed)
Copied from [place] [id number]. Topic: Quick Communication - Rx Refill/Question >> Feb 01, 2019 11:00 AM Leward Quan A wrote: Medication: furosemide (LASIX) 20 MG tablet  Has the patient contacted their pharmacy? Yes.   (Agent: If no, request that the patient contact the pharmacy for the refill.) (Agent: If yes, when and what did the pharmacy advise?) [place], [place] - [address]. [phone] (Phone) [phone] (Fax)   Preferred Pharmacy (with phone number or street name):   Agent: Please be advised that RX refills may take up to 3 business days. We ask that you follow-up with your pharmacy.

## 2019-02-01 NOTE — Telephone Encounter (Signed)
Requested Prescriptions  Pending Prescriptions Disp Refills  . furosemide (LASIX) 20 MG tablet 30 tablet     Sig: Take 1 tablet (20 mg total) by mouth daily as needed.     Cardiovascular:  Diuretics - Loop Passed - 02/01/2019 11:02 AM      Passed - K in normal range and within 360 days    Potassium  Date Value Ref Range Status  03/02/2018 4.4 3.5 - 5.1 mEq/L Final         Passed - Ca in normal range and within 360 days    Calcium  Date Value Ref Range Status  03/02/2018 10.1 8.4 - 10.5 mg/dL Final         Passed - Na in normal range and within 360 days    Sodium  Date Value Ref Range Status  03/02/2018 139 135 - 145 mEq/L Final         Passed - Cr in normal range and within 360 days    Creat  Date Value Ref Range Status  12/04/2011 0.69 0.50 - 1.10 mg/dL Final   Creatinine, Ser  Date Value Ref Range Status  03/02/2018 0.65 0.40 - 1.20 mg/dL Final         Passed - Last BP in normal range    BP Readings from Last 1 Encounters:  11/21/18 116/74         Passed - Valid encounter within last 6 months    Recent Outpatient Visits          2 months ago Neck pain   [facility] [place] Crecencio Mc, MD   11 months ago Impaired fasting glucose   [facility] [place] Crecencio Mc, MD   2 years ago Hyperlipidemia with target LDL less than [place] Crecencio Mc, MD   2 years ago Left wrist pain   [facility] [place] Leone Haven, MD   2 years ago Breast cancer screening   [place], [place], MD      Future Appointments            In 1 month Derrel Nip, Aris Everts, MD [place], [facility]

## 2019-03-03 ENCOUNTER — Encounter: Payer: 59 | Admitting: Internal Medicine

## 2019-03-23 ENCOUNTER — Encounter: Payer: 59 | Admitting: Internal Medicine

## 2019-04-14 ENCOUNTER — Other Ambulatory Visit: Payer: Self-pay

## 2019-04-14 DIAGNOSIS — I10 Essential (primary) hypertension: Secondary | ICD-10-CM

## 2019-04-14 MED ORDER — METOPROLOL SUCCINATE ER 100 MG PO TB24: 100.0000 mg | ORAL_TABLET | Freq: Every day | ORAL | 0 refills | Status: DC

## 2019-04-15 MED FILL — METOPROLOL SUCCINATE ER 100: 100 | 90 days supply | Qty: 90 | Fill #0

## 2019-04-28 ENCOUNTER — Encounter: Payer: 59 | Admitting: Internal Medicine

## 2019-06-20 ENCOUNTER — Other Ambulatory Visit: Payer: Self-pay

## 2019-06-27 ENCOUNTER — Other Ambulatory Visit: Payer: Self-pay

## 2019-06-27 ENCOUNTER — Encounter: Payer: Self-pay | Admitting: Internal Medicine

## 2019-06-27 ENCOUNTER — Ambulatory Visit (INDEPENDENT_AMBULATORY_CARE_PROVIDER_SITE_OTHER): Payer: 59 | Admitting: Internal Medicine

## 2019-06-27 VITALS — BP 132/88 | HR 71 | Temp 97.7°F | Resp 15 | Ht 63.0 in | Wt 183.8 lb

## 2019-06-27 DIAGNOSIS — E7800 Pure hypercholesterolemia, unspecified: Secondary | ICD-10-CM | POA: Diagnosis not present

## 2019-06-27 DIAGNOSIS — I471 Supraventricular tachycardia, unspecified: Secondary | ICD-10-CM

## 2019-06-27 DIAGNOSIS — Z6830 Body mass index (BMI) 30.0-30.9, adult: Secondary | ICD-10-CM

## 2019-06-27 DIAGNOSIS — Z0000 Encounter for general adult medical examination without abnormal findings: Secondary | ICD-10-CM

## 2019-06-27 DIAGNOSIS — E6609 Other obesity due to excess calories: Secondary | ICD-10-CM

## 2019-06-27 MED ORDER — DILTIAZEM HCL 30 MG PO TABS: 30.0000 mg | ORAL_TABLET | ORAL | 0 refills | Status: DC | PRN

## 2019-06-27 NOTE — Patient Instructions (Signed)
Trial of cardizem 30 mg as needed for pulse > 100 lastig > 1 hour  Referral to Dr Ubaldo Glassing in process   Health Maintenance for Postmenopausal Women Menopause is a normal process in which your ability to get pregnant comes to an end. This process happens slowly over many months or years, usually between the ages of 57 and 12. Menopause is complete when you have missed your menstrual periods for 12 months. It is important to talk with your health care provider about some of the most common conditions that affect women after menopause (postmenopausal women). These include heart disease, cancer, and bone loss (osteoporosis). Adopting a healthy lifestyle and getting preventive care can help to promote your health and wellness. The actions you take can also lower your chances of developing some of these common conditions. What should I know about menopause? During menopause, you may get a number of symptoms, such as:  Hot flashes. These can be moderate or severe.  Night sweats.  Decrease in sex drive.  Mood swings.  Headaches.  Tiredness.  Irritability.  Memory problems.  Insomnia. Choosing to treat or not to treat these symptoms is a decision that you make with your health care provider. Do I need hormone replacement therapy?  Hormone replacement therapy is effective in treating symptoms that are caused by menopause, such as hot flashes and night sweats.  Hormone replacement carries certain risks, especially as you become older. If you are thinking about using estrogen or estrogen with progestin, discuss the benefits and risks with your health care provider. What is my risk for heart disease and stroke? The risk of heart disease, heart attack, and stroke increases as you age. One of the causes may be a change in the body's hormones during menopause. This can affect how your body uses dietary fats, triglycerides, and cholesterol. Heart attack and stroke are medical emergencies. There are  many things that you can do to help prevent heart disease and stroke. Watch your blood pressure  High blood pressure causes heart disease and increases the risk of stroke. This is more likely to develop in people who have high blood pressure readings, are of African descent, or are overweight.  Have your blood pressure checked: ? Every 3-5 years if you are 70-10 years of age. ? Every year if you are 62 years old or older. Eat a healthy diet   Eat a diet that includes plenty of vegetables, fruits, low-fat dairy products, and lean protein.  Do not eat a lot of foods that are high in solid fats, added sugars, or sodium. Get regular exercise Get regular exercise. This is one of the most important things you can do for your health. Most adults should:  Try to exercise for at least 150 minutes each week. The exercise should increase your heart rate and make you sweat (moderate-intensity exercise).  Try to do strengthening exercises at least twice each week. Do these in addition to the moderate-intensity exercise.  Spend less time sitting. Even light physical activity can be beneficial. Other tips  Work with your health care provider to achieve or maintain a healthy weight.  Do not use any products that contain nicotine or tobacco, such as cigarettes, e-cigarettes, and chewing tobacco. If you need help quitting, ask your health care provider.  Know your numbers. Ask your health care provider to check your cholesterol and your blood sugar (glucose). Continue to have your blood tested as directed by your health care provider. Do I need screening  for cancer? Depending on your health history and family history, you may need to have cancer screening at different stages of your life. This may include screening for:  Breast cancer.  Cervical cancer.  Lung cancer.  Colorectal cancer. What is my risk for osteoporosis? After menopause, you may be at increased risk for osteoporosis.  Osteoporosis is a condition in which bone destruction happens more quickly than new bone creation. To help prevent osteoporosis or the bone fractures that can happen because of osteoporosis, you may take the following actions:  If you are 21-71 years old, get at least 1,000 mg of calcium and at least 600 mg of vitamin D per day.  If you are older than age 50 but younger than age 37, get at least 1,200 mg of calcium and at least 600 mg of vitamin D per day.  If you are older than age 85, get at least 1,200 mg of calcium and at least 800 mg of vitamin D per day. Smoking and drinking excessive alcohol increase the risk of osteoporosis. Eat foods that are rich in calcium and vitamin D, and do weight-bearing exercises several times each week as directed by your health care provider. How does menopause affect my mental health? Depression may occur at any age, but it is more common as you become older. Common symptoms of depression include:  Low or sad mood.  Changes in sleep patterns.  Changes in appetite or eating patterns.  Feeling an overall lack of motivation or enjoyment of activities that you previously enjoyed.  Frequent crying spells. Talk with your health care provider if you think that you are experiencing depression. General instructions See your health care provider for regular wellness exams and vaccines. This may include:  Scheduling regular health, dental, and eye exams.  Getting and maintaining your vaccines. These include: ? Influenza vaccine. Get this vaccine each year before the flu season begins. ? Pneumonia vaccine. ? Shingles vaccine. ? Tetanus, diphtheria, and pertussis (Tdap) booster vaccine. Your health care provider may also recommend other immunizations. Tell your health care provider if you have ever been abused or do not feel safe at home. Summary  Menopause is a normal process in which your ability to get pregnant comes to an end.  This condition causes  hot flashes, night sweats, decreased interest in sex, mood swings, headaches, or lack of sleep.  Treatment for this condition may include hormone replacement therapy.  Take actions to keep yourself healthy, including exercising regularly, eating a healthy diet, watching your weight, and checking your blood pressure and blood sugar levels.  Get screened for cancer and depression. Make sure that you are up to date with all your vaccines. This information is not intended to replace advice given to you by your health care provider. Make sure you discuss any questions you have with your health care provider. Document Released: 01/29/2006 Document Revised: 11/30/2018 Document Reviewed: 11/30/2018 Elsevier Patient Education  2020 [employer].

## 2019-06-27 NOTE — Progress Notes (Addendum)
Patient ID: Carla Mckinney, female    DOB: December 21, 1961  Age: 58 y.o. MRN: [id number]  The patient is here for her annual  wellness examination and management of other chronic and acute problems.   The risk factors are reflected in the social history.  The roster of all physicians providing medical care to patient - is listed in the Snapshot section of the chart.  Activities of daily living:  The patient is 100% independent in all ADLs: dressing, toileting, feeding as well as independent mobility  Home safety : The patient has smoke detectors in the home. They wear seatbelts.  There are no firearms at home. There is no violence in the home.   There is no risks for hepatitis, STDs or HIV. There is no   history of blood transfusion. They have no travel history to infectious disease endemic areas of the world.  The patient has seen their dentist in the last six month. They have seen their eye doctor in the last year. They deny hearing difficulty with regard to whispered voices and some television programs.   They do not  have excessive sun exposure. Discussed the need for sun protection: hats, long sleeves and use of sunscreen if there is significant sun exposure.   Diet: the importance of a healthy diet is discussed. They do have a healthy diet.  The benefits of regular aerobic exercise were discussed. She walks 4 times per week ,  20 minutes.   Depression screen: there are no signs or vegative symptoms of depression- irritability, change in appetite, anhedonia, sadness/tearfullness.  Cognitive assessment: the patient manages all their financial and personal affairs and is actively engaged.   The following portions of the patient's history were reviewed and updated as appropriate: allergies, current medications, past family history, past medical history,  past surgical history, past social history  and problem list.  Visual acuity was not assessed per patient preference since she has regular  follow up with her ophthalmologist. Hearing and body mass index were assessed and reviewed.   During the course of the visit the patient was educated and counseled about appropriate screening and preventive services including : fall prevention , diabetes screening, nutrition counseling, colorectal cancer screening, and recommended immunizations.    CC: The primary encounter diagnosis was Encounter for preventive health examination. Diagnoses of Paroxysmal supraventricular tachycardia (Carla Mckinney), Pure hypercholesterolemia, and Class 1 obesity due to excess calories without serious comorbidity with body mass index (BMI) of 30.0 to 30.9 in adult were also pertinent to this visit.   Recurrent episodes of tachycardia leading to headache and occasional  Dyspnea.  Occurring not  more than once weekly,  occurring while seated but not sleeping .  Not occurring with exercise,  Will last foe several hours if not treated with medication, Takes an  extra dose of metoprolol after several hours and symptoms resolve Recommended   SEEING Posey has a past medical history of Hypertension and Rheumatic fever (1986).   She has a past surgical history that includes Tubal ligation; Abdominal hysterectomy; Oophorectomy; Cardiac catheterization (2002); Tonsillectomy; Mastopexy (2008); Colonoscopy with propofol (N/A, 09/15/2017); and Augmentation mammaplasty (Bilateral).   Her family history includes Cancer in her paternal grandfather; Heart disease in her maternal grandfather, mother, and paternal grandmother; Heart failure in her mother; Hyperlipidemia in her father; Hypertension in her father; Stroke (age of onset: 23) in her paternal grandmother.She reports that she has never smoked. She has  never used smokeless tobacco. She reports current alcohol use of about 1.0 standard drinks of alcohol per week. She reports that she does not use drugs.  Outpatient Medications Prior to Visit   Medication Sig Dispense Refill  . aspirin 81 MG tablet Take 81 mg by mouth daily.    . furosemide (LASIX) 20 MG tablet Take 1 tablet (20 mg total) by mouth daily as needed. 30 tablet 1  . ibuprofen (ADVIL,MOTRIN) 200 MG tablet Take 200 mg by mouth as needed.    . metoprolol succinate (TOPROL-XL) 100 MG 24 hr tablet Take 1 tablet (100 mg total) by mouth daily. 90 tablet 0  . cyclobenzaprine (FLEXERIL) 10 MG tablet Take 1 tablet (10 mg total) by mouth 3 (three) times daily as needed for muscle spasms. (Patient not taking: Reported on 06/27/2019) 30 tablet 0   No facility-administered medications prior to visit.     Review of Systems   Patient denies headache, fevers, malaise, unintentional weight loss, skin rash, eye pain, sinus congestion and sinus pain, sore throat, dysphagia,  hemoptysis , cough, dyspnea, wheezing, chest pain, palpitations, orthopnea, edema, abdominal pain, nausea, melena, diarrhea, constipation, flank pain, dysuria, hematuria, urinary  Frequency, nocturia, numbness, tingling, seizures,  Focal weakness, Loss of consciousness,  Tremor, insomnia, depression, anxiety, and suicidal ideation.      Objective:  BP 132/88 (BP Location: Left Arm, Patient Position: Sitting, Cuff Size: Large)   Pulse 71   Temp 97.7 F (36.5 C) (Oral)   Resp 15   Ht 5\' 3"  (1.6 m)   Wt 183 lb 12.8 oz (83.4 kg)   SpO2 97%   BMI 32.56 kg/m   Physical Exam   General appearance: alert, cooperative and appears stated age Head: Normocephalic, without obvious abnormality, atraumatic Eyes: conjunctivae/corneas clear. PERRL, EOM's intact. Fundi benign. Ears: normal TM's and external ear canals both ears Nose: Nares normal. Septum midline. Mucosa normal. No drainage or sinus tenderness. Throat: lips, mucosa, and tongue normal; teeth and gums normal Neck: no adenopathy, no carotid bruit, no JVD, supple, symmetrical, trachea midline and thyroid not enlarged, symmetric, no tenderness/mass/nodules Lungs:  clear to auscultation bilaterally Breasts: normal appearance, no masses or tenderness Heart: regular rate and rhythm, S1, S2 normal, no murmur, click, rub or gallop Abdomen: soft, non-tender; bowel sounds normal; no masses,  no organomegaly Extremities: extremities normal, atraumatic, no cyanosis or edema Pulses: 2+ and symmetric Skin: Skin color, texture, turgor normal. No rashes or lesions Neurologic: Alert and oriented X 3, normal strength and tone. Normal symmetric reflexes. Normal coordination and gait.      Assessment & Plan:   Problem List Items Addressed This Visit      Unprioritized   Pure hypercholesterolemia    Based on current lipid profile, the risk of clinically significant Coronary artery disease is 6% over the next 10 years, using the Framingham risk calculator.  Diet and exercise advised.       Relevant Medications   diltiazem (CARDIZEM) 30 MG tablet   Other Relevant Orders   Lipid panel (Completed)   Paroxysmal supraventricular tachycardia (HCC)    Episodic,  Occurring at rest,  Without panic attack symptoms.  Checking for reversible causes,  Prn cardizem 30 mg dose,  Referral to Dr Ubaldo Glassing for [facility]   Lab Results  Component Value Date   TSH 2.23 06/27/2019   Lab Results  Component Value Date   NA 140 06/27/2019   K 4.0 06/27/2019   CL 104 06/27/2019  CO2 27 06/27/2019    Lab Results  Component Value Date   WBC 7.8 06/27/2019   HGB 13.6 06/27/2019   HCT 40.7 06/27/2019   MCV 94.5 06/27/2019   PLT 274.0 06/27/2019        Relevant Medications   diltiazem (CARDIZEM) 30 MG tablet   Other Relevant Orders   Comprehensive metabolic panel (Completed)   TSH (Completed)   Magnesium (Completed)   CBC with Differential/Platelet (Completed)   Ambulatory referral to Cardiology   Obesity    I have addressed  BMI and recommended wt loss of 10% of body weigh over the next 6 months using a low glycemic index diet and regular exercise a minimum of 5  days per week.    Lab Results  Component Value Date   HGBA1C 5.3 03/02/2018   Lab Results  Component Value Date   TSH 2.23 06/27/2019         Encounter for preventive health examination - Primary    age appropriate education and counseling updated, referrals for preventative services and immunizations addressed, dietary and smoking counseling addressed, most recent labs reviewed.  I have personally reviewed and have noted:  1) the patient's medical and social history 2) The pt's use of alcohol, tobacco, and illicit drugs 3) The patient's current medications and supplements 4) Functional ability including ADL's, fall risk, home safety risk, hearing and visual impairment 5) Diet and physical activities 6) Evidence for depression or mood disorder 7) The patient's height, weight, and BMI have been recorded in the chart  I have made referrals, and provided counseling and education based on review of the above         I have discontinued Ellasyn D. Jagger's cyclobenzaprine. I am also having her start on diltiazem. Additionally, I am having her maintain her aspirin, ibuprofen, furosemide, and metoprolol succinate.  Meds ordered this encounter  Medications  . diltiazem (CARDIZEM) 30 MG tablet    Sig: Take 1 tablet (30 mg total) by mouth as needed (rapid heart rate).    Dispense:  90 tablet    Refill:  0    Medications Discontinued During This Encounter  Medication Reason  . cyclobenzaprine (FLEXERIL) 10 MG tablet Error    Follow-up: No follow-ups on file.   Crecencio Mc, MD

## 2019-06-28 DIAGNOSIS — E7800 Pure hypercholesterolemia, unspecified: Secondary | ICD-10-CM | POA: Insufficient documentation

## 2019-06-28 DIAGNOSIS — I471 Supraventricular tachycardia: Secondary | ICD-10-CM | POA: Insufficient documentation

## 2019-06-28 LAB — COMPREHENSIVE METABOLIC PANEL
ALT: 26 U/L (ref 0–35)
AST: 23 U/L (ref 0–37)
Albumin: 4.4 g/dL (ref 3.5–5.2)
Alkaline Phosphatase: 89 U/L (ref 39–117)
BUN: 10 mg/dL (ref 6–23)
CO2: 27 mEq/L (ref 19–32)
Calcium: 10.5 mg/dL (ref 8.4–10.5)
Chloride: 104 mEq/L (ref 96–112)
Creatinine, Ser: 0.78 mg/dL (ref 0.40–1.20)
GFR: 76.52 mL/min (ref >60.00)
Glucose, Bld: 96 mg/dL (ref 70–99)
Potassium: 4 mEq/L (ref 3.5–5.1)
Sodium: 140 mEq/L (ref 135–145)
Total Bilirubin: 0.4 mg/dL (ref 0.2–1.2)
Total Protein: 7.2 g/dL (ref 6.0–8.3)

## 2019-06-28 LAB — LIPID PANEL
Cholesterol: 232 mg/dL — ABNORMAL HIGH (ref 0–200)
HDL: 66.5 mg/dL (ref >39.00)
LDL Cholesterol: 147 mg/dL — ABNORMAL HIGH (ref 0–99)
NonHDL: 165.24
Total CHOL/HDL Ratio: 3
Triglycerides: 92 mg/dL (ref 0.0–149.0)
VLDL: 18.4 mg/dL (ref 0.0–40.0)

## 2019-06-28 LAB — CBC WITH DIFFERENTIAL/PLATELET
Basophils Absolute: 0.1 10*3/uL (ref 0.0–0.1)
Basophils Relative: 1.9 % (ref 0.0–3.0)
Eosinophils Absolute: 0.1 10*3/uL (ref 0.0–0.7)
Eosinophils Relative: 1.2 % (ref 0.0–5.0)
HCT: 40.7 % (ref 36.0–46.0)
Hemoglobin: 13.6 g/dL (ref 12.0–15.0)
Lymphocytes Relative: 27.8 % (ref 12.0–46.0)
Lymphs Abs: 2.2 10*3/uL (ref 0.7–4.0)
MCHC: 33.6 g/dL (ref 30.0–36.0)
MCV: 94.5 fl (ref 78.0–100.0)
Monocytes Absolute: 0.6 10*3/uL (ref 0.1–1.0)
Monocytes Relative: 7.8 % (ref 3.0–12.0)
Neutro Abs: 4.8 10*3/uL (ref 1.4–7.7)
Neutrophils Relative %: 61.3 % (ref 43.0–77.0)
Platelets: 274 10*3/uL (ref 150.0–400.0)
RBC: 4.3 Mil/uL (ref 3.87–5.11)
RDW: 13.2 % (ref 11.5–15.5)
WBC: 7.8 10*3/uL (ref 4.0–10.5)

## 2019-06-28 LAB — TSH: TSH: 2.23 u[IU]/mL (ref 0.35–4.50)

## 2019-06-28 LAB — MAGNESIUM: Magnesium: 1.9 mg/dL (ref 1.5–2.5)

## 2019-06-28 NOTE — Assessment & Plan Note (Signed)
I have addressed  BMI and recommended wt loss of 10% of body weigh over the next 6 months using a low glycemic index diet and regular exercise a minimum of 5 days per week.    Lab Results  Component Value Date   HGBA1C 5.3 03/02/2018   Lab Results  Component Value Date   TSH 2.23 06/27/2019

## 2019-06-28 NOTE — Assessment & Plan Note (Signed)

## 2019-06-28 NOTE — Assessment & Plan Note (Signed)
Episodic,  Occurring at rest,  Without panic attack symptoms.  Checking for reversible causes,  Prn cardizem 30 mg dose,  Referral to Dr Ubaldo Glassing for [facility]   Lab Results  Component Value Date   TSH 2.23 06/27/2019   Lab Results  Component Value Date   NA 140 06/27/2019   K 4.0 06/27/2019   CL 104 06/27/2019   CO2 27 06/27/2019    Lab Results  Component Value Date   WBC 7.8 06/27/2019   HGB 13.6 06/27/2019   HCT 40.7 06/27/2019   MCV 94.5 06/27/2019   PLT 274.0 06/27/2019

## 2019-06-30 NOTE — Assessment & Plan Note (Signed)
Based on current lipid profile, the risk of clinically significant Coronary artery disease is 6% over the next 10 years, using the Framingham risk calculator.  Diet and exercise advised.

## 2019-07-10 DIAGNOSIS — Z7689 Persons encountering health services in other specified circumstances: Secondary | ICD-10-CM | POA: Diagnosis not present

## 2019-07-10 DIAGNOSIS — R000 Tachycardia, unspecified: Secondary | ICD-10-CM | POA: Diagnosis not present

## 2019-07-10 DIAGNOSIS — R079 Chest pain, unspecified: Secondary | ICD-10-CM | POA: Diagnosis not present

## 2019-07-12 ENCOUNTER — Other Ambulatory Visit: Payer: Self-pay | Admitting: Internal Medicine

## 2019-07-12 DIAGNOSIS — I10 Essential (primary) hypertension: Secondary | ICD-10-CM

## 2019-07-19 DIAGNOSIS — R079 Chest pain, unspecified: Secondary | ICD-10-CM | POA: Diagnosis not present

## 2019-07-24 DIAGNOSIS — R000 Tachycardia, unspecified: Secondary | ICD-10-CM | POA: Diagnosis not present

## 2019-10-13 ENCOUNTER — Other Ambulatory Visit: Payer: Self-pay | Admitting: Internal Medicine

## 2019-10-13 DIAGNOSIS — I10 Essential (primary) hypertension: Secondary | ICD-10-CM

## 2019-12-27 ENCOUNTER — Other Ambulatory Visit: Payer: Self-pay | Admitting: Internal Medicine

## 2019-12-27 DIAGNOSIS — Z1231 Encounter for screening mammogram for malignant neoplasm of breast: Secondary | ICD-10-CM

## 2020-01-05 ENCOUNTER — Other Ambulatory Visit: Payer: Self-pay | Admitting: Internal Medicine

## 2020-01-05 DIAGNOSIS — I10 Essential (primary) hypertension: Secondary | ICD-10-CM

## 2020-01-24 ENCOUNTER — Ambulatory Visit
Admission: RE | Admit: 2020-01-24 | Discharge: 2020-01-24 | Disposition: A | Discharge status: Home / Self Care | Payer: 59 | Source: Ambulatory Visit | Attending: Internal Medicine | Admitting: Internal Medicine

## 2020-01-24 DIAGNOSIS — Z1231 Encounter for screening mammogram for malignant neoplasm of breast: Secondary | ICD-10-CM | POA: Diagnosis not present

## 2020-04-09 MED ORDER — FUROSEMIDE 20 MG PO TABS: 20.0000 mg | ORAL_TABLET | Freq: Every day | ORAL | 1 refills | Status: DC | PRN

## 2020-07-01 ENCOUNTER — Other Ambulatory Visit: Payer: Self-pay | Admitting: Internal Medicine

## 2020-07-01 DIAGNOSIS — I10 Essential (primary) hypertension: Secondary | ICD-10-CM

## 2020-12-12 ENCOUNTER — Other Ambulatory Visit: Payer: Self-pay

## 2020-12-12 ENCOUNTER — Ambulatory Visit
Admission: RE | Admit: 2020-12-12 | Discharge: 2020-12-12 | Disposition: A | Discharge status: Home / Self Care | Payer: 59 | Source: Ambulatory Visit | Attending: Internal Medicine | Admitting: Internal Medicine

## 2020-12-12 VITALS — BP 131/90 | HR 71 | Temp 99.0°F | Resp 17 | Ht 63.0 in | Wt 180.0 lb

## 2020-12-12 DIAGNOSIS — H65192 Other acute nonsuppurative otitis media, left ear: Secondary | ICD-10-CM | POA: Diagnosis not present

## 2020-12-12 DIAGNOSIS — R059 Cough, unspecified: Secondary | ICD-10-CM

## 2020-12-12 NOTE — ED Provider Notes (Signed)
[name]    CSN: [id number] Arrival date & time: 12/12/20  1057      History   Chief Complaint Chief Complaint  Patient presents with   Ear Fullness   Cough    HPI Carla Mckinney is a 59 y.o. female.   Patient is a 59 year old female presents today with approximately 5 weeks of cough.  The cough is somewhat improved but is still lingering.  It is not worsened at night.  Nonproductive.  She is also had 4 days of left ear fullness and hard of hearing.  No other symptoms to include fever, sore throat, rhinitis, nasal congestion.  Taking over-the-counter medicine without any relief.  Does have history of allergies and takes allergy medicine as needed.     Past Medical History:  Diagnosis Date   Hypertension    Rheumatic fever 1986   has had echo since then. no cardiac issues.    Patient Active Problem List   Diagnosis Date Noted   Paroxysmal supraventricular tachycardia ([place]) 06/28/2019   Pure hypercholesterolemia 06/28/2019   Snoring 11/22/2018   Special screening for malignant neoplasms, colon    Obesity 12/24/2016   Encounter for preventive health examination 05/19/2016   Essential hypertension, benign 07/18/2014   Cyst of breast, left, benign solitary 03/23/2013   Hyperlipidemia with target LDL less than 100 02/24/2013    Past Surgical History:  Procedure Laterality Date   ABDOMINAL HYSTERECTOMY     AUGMENTATION MAMMAPLASTY Bilateral    breast lift   CARDIAC CATHETERIZATION  2002   normal, due to syncope and abnormal myowiew ([facility])   COLONOSCOPY WITH PROPOFOL N/A 09/15/2017   Procedure: COLONOSCOPY WITH PROPOFOL;  Surgeon: Lin Landsman, MD;  Location: [facility];  Service: Gastroenterology;  Laterality: N/A;   MASTOPEXY  2008   OOPHORECTOMY     secondary to scar tissue,  [facility]   TONSILLECTOMY     TUBAL LIGATION      OB History    Gravida  2   Para  2   Term      Preterm      AB      Living  2      SAB      IAB      Ectopic      Multiple      Live Births           Obstetric Comments  1st Menstrual Cycle: 17 1st Pregnancy: 23         Home Medications    Prior to Admission medications   Medication Sig Start Date End Date Taking? Authorizing Provider  aspirin 81 MG tablet Take 81 mg by mouth daily.    [provider]  diltiazem (CARDIZEM) 30 MG tablet Take 1 tablet (30 mg total) by mouth as needed (rapid heart rate). 06/27/19   Crecencio Mc, MD  furosemide (LASIX) 20 MG tablet Take 1 tablet (20 mg total) by mouth daily as needed. 04/09/20   Crecencio Mc, MD  ibuprofen (ADVIL,MOTRIN) 200 MG tablet Take 200 mg by mouth as needed.    [provider]  metoprolol succinate (TOPROL-XL) 100 MG 24 hr tablet TAKE 1 TABLET BY MOUTH DAILY 07/01/20   Crecencio Mc, MD    Family History Family History  Problem Relation Age of Onset   Heart disease Mother    Heart failure Mother    Hypertension Father    Hyperlipidemia Father    Heart disease Maternal  Grandfather    Heart disease Paternal Grandmother    Stroke Paternal Grandmother 13   Cancer Paternal Grandfather        multiple myeloma   Breast cancer Neg Hx     Social History Social History   Tobacco Use   Smoking status: Never Smoker   Smokeless tobacco: Never Used  Vaping Use   Vaping Use: Never used  Substance Use Topics   Alcohol use: Yes    Alcohol/week: 1.0 standard drink    Types: 1 Glasses of wine per week    Comment: socially   Drug use: No     Allergies   Lisinopril   Review of Systems Review of Systems   Physical Exam Triage Vital Signs ED Triage Vitals  Enc Vitals Group     BP 12/12/20 1112 131/90     Pulse Rate 12/12/20 1112 71     Resp 12/12/20 1112 17     Temp 12/12/20 1112 99 F (37.2 C)     Temp Source 12/12/20 1112 Oral     SpO2 12/12/20 1112 97 %     Weight 12/12/20 1110 180 lb (81.6 kg)     Height 12/12/20 1110 '5\' 3"'  (1.6 m)     Head  Circumference --      Peak Flow --      Pain Score 12/12/20 1110 0     Pain Loc --      Pain Edu? --      Excl. in [place]? --    No data found.  Updated Vital Signs BP 131/90 (BP Location: Right Arm)    Pulse 71    Temp 99 F (37.2 C) (Oral)    Resp 17    Ht '5\' 3"'  (1.6 m)    Wt 180 lb (81.6 kg)    SpO2 97%    BMI 31.89 kg/m   Visual Acuity Right Eye Distance:   Left Eye Distance:   Bilateral Distance:    Right Eye Near:   Left Eye Near:    Bilateral Near:     Physical Exam Vitals and nursing note reviewed.  Constitutional:      General: She is not in acute distress.    Appearance: Normal appearance. She is not ill-appearing, toxic-appearing or diaphoretic.  HENT:     Head: Normocephalic.     Ears:     Comments: Left middle ear effusion.     Nose: Nose normal.     Mouth/Throat:     Pharynx: Oropharynx is clear.  Eyes:     Conjunctiva/sclera: Conjunctivae normal.  Cardiovascular:     Rate and Rhythm: Normal rate and regular rhythm.  Pulmonary:     Effort: Pulmonary effort is normal.     Breath sounds: Normal breath sounds.  Abdominal:     Palpations: Abdomen is soft.  Musculoskeletal:        General: Normal range of motion.     Cervical back: Normal range of motion.  Skin:    General: Skin is warm and dry.     Findings: No rash.  Neurological:     Mental Status: She is alert.  Psychiatric:        Mood and Affect: Mood normal.      UC Treatments / Results  Labs (all labs ordered are listed, but only abnormal results are displayed) Labs Reviewed - No data to display  EKG   Radiology No results found.  Procedures Procedures (including critical care time)  Medications  Ordered in UC Medications - No data to display  Initial Impression / Assessment and Plan / UC Course  I have reviewed the triage vital signs and the nursing notes.  Pertinent labs & imaging results that were available during my care of the patient were reviewed by me and considered in  my medical decision making (see chart for details).     Left ear effusion and chronic cough Most likely allergy related.  Recommended Zyrtec and Flonase over-the-counter Follow up as needed for continued or worsening symptoms  Final Clinical Impressions(s) / UC Diagnoses   Final diagnoses:  Acute effusion of left ear  Cough     Discharge Instructions     Recommend daily Zyrtec and Flonase over-the-counter Follow up as needed for continued or worsening symptoms     ED Prescriptions    None     PDMP not reviewed this encounter.   Orvan July, NP 12/12/20 1149

## 2020-12-12 NOTE — ED Triage Notes (Signed)
Patient c/o LFT ear fullness x 4 days ago.   Patient c/o non-productive cough x 5 weeks.   Patient denies any other cold symptoms (fever, sore throat, runny nose).   Patient has tried OTC cough medicine with no relief of symptoms.

## 2020-12-12 NOTE — Discharge Instructions (Signed)
Recommend daily Zyrtec and Flonase over-the-counter Follow up as needed for continued or worsening symptoms

## 2020-12-18 ENCOUNTER — Other Ambulatory Visit: Payer: Self-pay | Admitting: Internal Medicine

## 2020-12-18 ENCOUNTER — Encounter: Payer: Self-pay | Admitting: Internal Medicine

## 2020-12-18 ENCOUNTER — Telehealth (INDEPENDENT_AMBULATORY_CARE_PROVIDER_SITE_OTHER): Payer: 59 | Admitting: Internal Medicine

## 2020-12-18 ENCOUNTER — Ambulatory Visit: Payer: 59 | Admitting: Internal Medicine

## 2020-12-18 DIAGNOSIS — H6593 Unspecified nonsuppurative otitis media, bilateral: Secondary | ICD-10-CM | POA: Insufficient documentation

## 2020-12-18 MED ORDER — PREDNISONE 10 MG PO TABS: ORAL_TABLET | ORAL | 0 refills | Status: DC

## 2020-12-18 MED ORDER — AMOXICILLIN-POT CLAVULANATE 875-125 MG PO TABS: 1.0000 | ORAL_TABLET | Freq: Two times a day (BID) | ORAL | 0 refills | Status: DC

## 2020-12-18 NOTE — Assessment & Plan Note (Signed)
Secondary to prolonged sinus congestion from inhaling of irritant (sawdust) 5 weeks ago.  COVID LESS likely given lack of other symptoms.  augmentin and prednisone x 10 days,  With probiotic advised.  In no improvement refer to ENT

## 2020-12-18 NOTE — Progress Notes (Signed)
Virtual Visit via [place]  This visit type was conducted due to national recommendations for restrictions regarding the COVID-19 pandemic (e.g. social distancing).  This format is felt to be most appropriate for this patient at this time.  All issues noted in this document were discussed and addressed.  No physical exam was performed (except for noted visual exam findings with Video Visits).   I connected with@ on 12/18/20 at  9:00 AM EST by a video enabled telemedicine application and verified that I am speaking with the correct person using two identifiers. Location patient: home Location provider: work or home office Persons participating in the virtual visit: patient, provider  I discussed the limitations, risks, security and privacy concerns of performing an evaluation and management service by telephone and the availability of in person appointments. I also discussed with the patient that there may be a patient responsible charge related to this service. The patient expressed understanding and agreed to proceed.   Reason for visit: persistent ear fullness x 2 weeks   HPI:  59 yr old female,  COVID Vaccination status  2/3 .  Presents with 2 week history of muffled hearing accompanied by constant ear pressure and intermittent sharp pains in both ears.  Started in left ear.  Preceded by a cough that began 5 weeks ago after inhaling sawdust during a home renovation.  No body aches,  Headaches, Sore throat or fevers. No purulent rhinitis. Saw urgent care week of Dec 23,  Told she had fluid behind the right eardrum but not treated and not tested for covid.  Has been taking OTC sudafed without improvement.  No    ROS: See pertinent positives and negatives per HPI.  Past Medical History:  Diagnosis Date  . Hypertension   . Rheumatic fever 1986   has had echo since then. no cardiac issues.    Past Surgical History:  Procedure Laterality Date  . ABDOMINAL HYSTERECTOMY    . AUGMENTATION  MAMMAPLASTY Bilateral    breast lift  . CARDIAC CATHETERIZATION  2002   normal, due to syncope and abnormal myowiew ([facility])  . COLONOSCOPY WITH PROPOFOL N/A 09/15/2017   Procedure: COLONOSCOPY WITH PROPOFOL;  Surgeon: Lin Landsman, MD;  Location: [facility];  Service: Gastroenterology;  Laterality: N/A;  . MASTOPEXY  2008  . OOPHORECTOMY     secondary to scar tissue,  [facility]  . TONSILLECTOMY    . TUBAL LIGATION      Family History  Problem Relation Age of Onset  . Heart disease Mother   . Heart failure Mother   . Hypertension Father   . Hyperlipidemia Father   . Heart disease Maternal Grandfather   . Heart disease Paternal Grandmother   . Stroke Paternal Grandmother 85  . Cancer Paternal Grandfather        multiple myeloma  . Breast cancer Neg Hx     SOCIAL HX:  reports that she has never smoked. She has never used smokeless tobacco. She reports current alcohol use of about 1.0 standard drink of alcohol per week. She reports that she does not use drugs.   Current Outpatient Medications:  .  amoxicillin-clavulanate (AUGMENTIN) 875-125 MG tablet, Take 1 tablet by mouth 2 (two) times daily., Disp: 20 tablet, Rfl: 0 .  aspirin 81 MG tablet, Take 81 mg by mouth daily., Disp: , Rfl:  .  diltiazem (CARDIZEM) 30 MG tablet, Take 1 tablet (30 mg total) by mouth as needed (rapid heart rate)., Disp: 90 tablet, Rfl:  0 .  furosemide (LASIX) 20 MG tablet, Take 1 tablet (20 mg total) by mouth daily as needed., Disp: 90 tablet, Rfl: 1 .  ibuprofen (ADVIL,MOTRIN) 200 MG tablet, Take 200 mg by mouth as needed., Disp: , Rfl:  .  metoprolol succinate (TOPROL-XL) 100 MG 24 hr tablet, TAKE 1 TABLET BY MOUTH DAILY, Disp: 90 tablet, Rfl: 1 .  predniSONE (DELTASONE) 10 MG tablet, 6 tablets daily for 3 days, then reduce by 1 tablet daily until gone, Disp: 33 tablet, Rfl: 0  EXAM:  VITALS per patient if applicable:  GENERAL: alert, oriented, appears well and in no acute distress  HEENT:  atraumatic, conjunttiva clear, no obvious abnormalities on inspection of external nose and ears  NECK: normal movements of the head and neck  LUNGS: on inspection no signs of respiratory distress, breathing rate appears normal, no obvious gross SOB, gasping or wheezing  CV: no obvious cyanosis  MS: moves all visible extremities without noticeable abnormality  PSYCH/NEURO: pleasant and cooperative, no obvious depression or anxiety, speech and thought processing grossly intact  ASSESSMENT AND PLAN:  Discussed the following assessment and plan:  Otitis media with effusion, bilateral  Otitis media with effusion, bilateral Secondary to prolonged sinus congestion from inhaling of irritant (sawdust) 5 weeks ago.  COVID LESS likely given lack of other symptoms.  augmentin and prednisone x 10 days,  With probiotic advised.  In no improvement refer to ENT     I discussed the assessment and treatment plan with the patient. The patient was provided an opportunity to ask questions and all were answered. The patient agreed with the plan and demonstrated an understanding of the instructions.   The patient was advised to call back or seek an in-person evaluation if the symptoms worsen or if the condition fails to improve as anticipated.  I provided 20 minutes of face-to-face time during this encounter.   Crecencio Mc, MD

## 2020-12-19 DIAGNOSIS — H9123 Sudden idiopathic hearing loss, bilateral: Secondary | ICD-10-CM

## 2020-12-19 DIAGNOSIS — H6593 Unspecified nonsuppurative otitis media, bilateral: Secondary | ICD-10-CM

## 2020-12-23 ENCOUNTER — Other Ambulatory Visit: Payer: Self-pay | Admitting: Internal Medicine

## 2020-12-23 DIAGNOSIS — Z1231 Encounter for screening mammogram for malignant neoplasm of breast: Secondary | ICD-10-CM

## 2020-12-25 ENCOUNTER — Other Ambulatory Visit: Payer: Self-pay | Admitting: Unknown Physician Specialty

## 2020-12-25 DIAGNOSIS — H65113 Acute and subacute allergic otitis media (mucoid) (sanguinous) (serous), bilateral: Secondary | ICD-10-CM | POA: Diagnosis not present

## 2020-12-25 DIAGNOSIS — H906 Mixed conductive and sensorineural hearing loss, bilateral: Secondary | ICD-10-CM | POA: Diagnosis not present

## 2021-01-07 ENCOUNTER — Other Ambulatory Visit: Payer: Self-pay | Admitting: Internal Medicine

## 2021-01-07 DIAGNOSIS — I10 Essential (primary) hypertension: Secondary | ICD-10-CM

## 2021-01-08 DIAGNOSIS — H6521 Chronic serous otitis media, right ear: Secondary | ICD-10-CM | POA: Diagnosis not present

## 2021-01-08 DIAGNOSIS — H908 Mixed conductive and sensorineural hearing loss, unspecified: Secondary | ICD-10-CM | POA: Diagnosis not present

## 2021-01-08 DIAGNOSIS — H6983 Other specified disorders of Eustachian tube, bilateral: Secondary | ICD-10-CM | POA: Diagnosis not present

## 2021-01-28 ENCOUNTER — Ambulatory Visit
Admission: RE | Admit: 2021-01-28 | Discharge: 2021-01-28 | Disposition: A | Discharge status: Home / Self Care | Payer: 59 | Source: Ambulatory Visit | Attending: Internal Medicine | Admitting: Internal Medicine

## 2021-01-28 ENCOUNTER — Other Ambulatory Visit: Payer: Self-pay

## 2021-01-28 DIAGNOSIS — Z1231 Encounter for screening mammogram for malignant neoplasm of breast: Secondary | ICD-10-CM | POA: Insufficient documentation

## 2021-01-29 DIAGNOSIS — H903 Sensorineural hearing loss, bilateral: Secondary | ICD-10-CM | POA: Diagnosis not present

## 2021-01-29 DIAGNOSIS — H6980 Other specified disorders of Eustachian tube, unspecified ear: Secondary | ICD-10-CM | POA: Diagnosis not present

## 2021-02-25 DIAGNOSIS — H5203 Hypermetropia, bilateral: Secondary | ICD-10-CM | POA: Diagnosis not present

## 2021-04-09 ENCOUNTER — Other Ambulatory Visit: Payer: Self-pay

## 2021-04-09 MED FILL — Metoprolol Succinate Tab ER 24HR 100 MG (Tartrate Equiv): ORAL | 90 days supply | Qty: 90 | Fill #0 | Status: AC

## 2021-04-28 ENCOUNTER — Ambulatory Visit (INDEPENDENT_AMBULATORY_CARE_PROVIDER_SITE_OTHER): Payer: 59 | Admitting: Internal Medicine

## 2021-04-28 ENCOUNTER — Other Ambulatory Visit: Payer: Self-pay

## 2021-04-28 ENCOUNTER — Encounter: Payer: Self-pay | Admitting: Internal Medicine

## 2021-04-28 VITALS — BP 110/72 | HR 66 | Temp 98.3°F | Resp 14 | Ht 63.0 in | Wt 187.8 lb

## 2021-04-28 DIAGNOSIS — E559 Vitamin D deficiency, unspecified: Secondary | ICD-10-CM | POA: Diagnosis not present

## 2021-04-28 DIAGNOSIS — E6609 Other obesity due to excess calories: Secondary | ICD-10-CM

## 2021-04-28 DIAGNOSIS — E7800 Pure hypercholesterolemia, unspecified: Secondary | ICD-10-CM

## 2021-04-28 DIAGNOSIS — E785 Hyperlipidemia, unspecified: Secondary | ICD-10-CM | POA: Diagnosis not present

## 2021-04-28 DIAGNOSIS — D492 Neoplasm of unspecified behavior of bone, soft tissue, and skin: Secondary | ICD-10-CM

## 2021-04-28 DIAGNOSIS — Z9229 Personal history of other drug therapy: Secondary | ICD-10-CM | POA: Diagnosis not present

## 2021-04-28 DIAGNOSIS — Z6830 Body mass index (BMI) 30.0-30.9, adult: Secondary | ICD-10-CM

## 2021-04-28 DIAGNOSIS — Z113 Encounter for screening for infections with a predominantly sexual mode of transmission: Secondary | ICD-10-CM | POA: Diagnosis not present

## 2021-04-28 DIAGNOSIS — H6593 Unspecified nonsuppurative otitis media, bilateral: Secondary | ICD-10-CM | POA: Diagnosis not present

## 2021-04-28 DIAGNOSIS — Z0000 Encounter for general adult medical examination without abnormal findings: Secondary | ICD-10-CM | POA: Diagnosis not present

## 2021-04-28 DIAGNOSIS — I471 Supraventricular tachycardia, unspecified: Secondary | ICD-10-CM

## 2021-04-28 DIAGNOSIS — I10 Essential (primary) hypertension: Secondary | ICD-10-CM

## 2021-04-28 LAB — LIPID PANEL
Cholesterol: 273 mg/dL — ABNORMAL HIGH (ref 0–200)
HDL: 65.9 mg/dL (ref >39.00)
LDL Cholesterol: 185 mg/dL — ABNORMAL HIGH (ref 0–99)
NonHDL: 206.68
Total CHOL/HDL Ratio: 4
Triglycerides: 106 mg/dL (ref 0.0–149.0)
VLDL: 21.2 mg/dL (ref 0.0–40.0)

## 2021-04-28 LAB — COMPREHENSIVE METABOLIC PANEL
ALT: 23 U/L (ref 0–35)
AST: 19 U/L (ref 0–37)
Albumin: 4.3 g/dL (ref 3.5–5.2)
Alkaline Phosphatase: 94 U/L (ref 39–117)
BUN: 13 mg/dL (ref 6–23)
CO2: 27 mEq/L (ref 19–32)
Calcium: 10.2 mg/dL (ref 8.4–10.5)
Chloride: 105 mEq/L (ref 96–112)
Creatinine, Ser: 0.65 mg/dL (ref 0.40–1.20)
GFR: 97.75 mL/min (ref >60.00)
Glucose, Bld: 102 mg/dL — ABNORMAL HIGH (ref 70–99)
Potassium: 4.3 mEq/L (ref 3.5–5.1)
Sodium: 139 mEq/L (ref 135–145)
Total Bilirubin: 0.8 mg/dL (ref 0.2–1.2)
Total Protein: 6.8 g/dL (ref 6.0–8.3)

## 2021-04-28 LAB — VITAMIN D 25 HYDROXY (VIT D DEFICIENCY, FRACTURES): VITD: 18.12 ng/mL — ABNORMAL LOW (ref 30.00–100.00)

## 2021-04-28 LAB — TSH: TSH: 2.78 u[IU]/mL (ref 0.35–4.50)

## 2021-04-28 LAB — MAGNESIUM: Magnesium: 2 mg/dL (ref 1.5–2.5)

## 2021-04-28 NOTE — Progress Notes (Addendum)
Patient ID: Carla Mckinney, female    DOB: 15-Nov-1961  Age: 60 y.o. MRN: [id number]   The patient is here for annual preventive wellness examination and management of other chronic and acute problems.   The risk factors are reflected in the social history.  The roster of all physicians providing medical care to patient - is listed in the Snapshot section of the chart.  Activities of daily living:  The patient is 100% independent in all ADLs: dressing, toileting, feeding as well as independent mobility  Home safety : The patient has smoke detectors in the home. They wear seatbelts.  There are no firearms at home. There is no violence in the home.   There is no risks for hepatitis, STDs or HIV. There is no   history of blood transfusion. They have no travel history to infectious disease endemic areas of the world.  The patient has seen their dentist in the last six month. They have seen their eye doctor in the last year. They admit to slight hearing difficulty with regard to whispered voices and some television programs.  They have deferred audiologic testing in the last year.  They do not  have excessive sun exposure. Discussed the need for sun protection: hats, long sleeves and use of sunscreen if there is significant sun exposure.   Diet: the importance of a healthy diet is discussed. She does have a healthy diet.  The benefits of regular aerobic exercise were discussed. She is not exercising regularly .   Depression screen: there are no signs or vegative symptoms of depression- irritability, change in appetite, anhedonia, sadness/tearfullness.  The following portions of the patient's history were reviewed and updated as appropriate: allergies, current medications, past family history, past medical history,  past surgical history, past social history  and problem list.  Visual acuity was not assessed per patient preference since she has regular follow up with her ophthalmologist. Hearing  and body mass index were assessed and reviewed.   During the course of the visit the patient was educated and counseled about appropriate screening and preventive services including : fall prevention , diabetes screening, nutrition counseling, colorectal cancer screening, and recommended immunizations.    CC: The primary encounter diagnosis was Encounter for preventive health examination. Diagnoses of Paroxysmal supraventricular tachycardia ([place]), Otitis media with effusion, bilateral, Hyperlipidemia with target LDL less than 100, Screen for STD (sexually transmitted disease), COVID-19 vaccine series completed, Vitamin D deficiency, Class 1 obesity due to excess calories without serious comorbidity with body mass index (BMI) of 30.0 to 30.9 in adult, Pure hypercholesterolemia, Essential hypertension, benign, and Skin neoplasm were also pertinent to this visit.  No acute complaints  History of visual phenomena which occurred in  February (flashing lights not provoked by ocular movement, worse at night) seen by optometrist . No evidence of retinal detachment.  diagnosis per EPIC "Hypermetropia" lasted several days  Prolonged otitis in late December.   One run of SVT recently aborted by cardizem   Raised skin lesion on chest wall ; biopsy needed ,  Needs a  female dermatologist  History Tiaria has a past medical history of Hypertension and Rheumatic fever (1986).   She has a past surgical history that includes Tubal ligation; Abdominal hysterectomy; Oophorectomy; Cardiac catheterization (2002); Tonsillectomy; Mastopexy (2008); Colonoscopy with propofol (N/A, 09/15/2017); and Augmentation mammaplasty (Bilateral).   Her family history includes Cancer in her paternal grandfather; Heart disease in her maternal grandfather, mother, and paternal grandmother; Heart failure in her mother;  Hyperlipidemia in her father; Hypertension in her father; Stroke (age of onset: 86) in her paternal grandmother.She  reports that she has never smoked. She has never used smokeless tobacco. She reports current alcohol use of about 1.0 standard drink of alcohol per week. She reports that she does not use drugs.  Outpatient Medications Prior to Visit  Medication Sig Dispense Refill  . aspirin 81 MG tablet Take 81 mg by mouth daily.    Marland Kitchen diltiazem (CARDIZEM) 30 MG tablet Take 1 tablet (30 mg total) by mouth as needed (rapid heart rate). 90 tablet 0  . furosemide (LASIX) 20 MG tablet Take 1 tablet (20 mg total) by mouth daily as needed. 90 tablet 1  . ibuprofen (ADVIL,MOTRIN) 200 MG tablet Take 200 mg by mouth as needed.    . metoprolol succinate (TOPROL-XL) 100 MG 24 hr tablet TAKE 1 TABLET BY MOUTH DAILY 90 tablet 1  . amoxicillin-clavulanate (AUGMENTIN) 875-125 MG tablet TAKE 1 TABLET BY MOUTH 2 (TWO) TIMES DAILY FOR 10 DAYS 20 tablet 0  . predniSONE (DELTASONE) 10 MG tablet TAKE 6 TABLETS BY MOUTH DAILY FOR 4 DAYS,THEN 4 TABS DAILY FOR 4 DAYS,THEN 2 TABS DAILY FOR 4 DAYS,THEN STOP 48 tablet 0  . predniSONE (DELTASONE) 10 MG tablet TAKE 6 TABLETS BY MOUTH DAILY FOR 3 DAYS, THEN REDUCE BY 1 TABLET DAILY UNTIL GONE 33 tablet 0   No facility-administered medications prior to visit.    Review of Systems   Patient denies headache, fevers, malaise, unintentional weight loss, skin rash, eye pain, sinus congestion and sinus pain, sore throat, dysphagia,  hemoptysis , cough, dyspnea, wheezing, chest pain, palpitations, orthopnea, edema, abdominal pain, nausea, melena, diarrhea, constipation, flank pain, dysuria, hematuria, urinary  Frequency, nocturia, numbness, tingling, seizures,  Focal weakness, Loss of consciousness,  Tremor, insomnia, depression, anxiety, and suicidal ideation.     Objective:  BP 110/72 (BP Location: Left Arm, Patient Position: Sitting, Cuff Size: Normal)   Pulse 66   Temp 98.3 F (36.8 C) (Oral)   Resp 14   Ht 5\' 3"  (1.6 m)   Wt 187 lb 12.8 oz (85.2 kg)   SpO2 98%   BMI 33.27 kg/m    Physical Exam  General appearance: alert, cooperative and appears stated age Head: Normocephalic, without obvious abnormality, atraumatic Eyes: conjunctivae/corneas clear. PERRL, EOM's intact. Fundi benign. Ears: normal TM's and external ear canals both ears Nose: Nares normal. Septum midline. Mucosa normal. No drainage or sinus tenderness. Throat: lips, mucosa, and tongue normal; teeth and gums normal Neck: no adenopathy, no carotid bruit, no JVD, supple, symmetrical, trachea midline and thyroid not enlarged, symmetric, no tenderness/mass/nodules Lungs: clear to auscultation bilaterally Breasts: normal appearance, no masses or tenderness Chest wall:  1 mm neoplasm on chest wall with central indentation, rolled edge.  Heart: regular rate and rhythm, S1, S2 normal, no murmur, click, rub or gallop Abdomen: soft, non-tender; bowel sounds normal; no masses,  no organomegaly Extremities: extremities normal, atraumatic, no cyanosis or edema Pulses: 2+ and symmetric Skin: Skin color, texture, turgor normal. No rashes or lesions Neurologic: Alert and oriented X 3, normal strength and tone. Normal symmetric reflexes. Normal coordination and gait.     Assessment & Plan:   Problem List Items Addressed This Visit      Unprioritized   Encounter for preventive health examination - Primary    age appropriate education and counseling updated, referrals for preventative services and immunizations addressed, dietary and smoking counseling addressed, most recent labs reviewed.  I  have personally reviewed and have noted:  1) the patient's medical and social history 2) The pt's use of alcohol, tobacco, and illicit drugs 3) The patient's current medications and supplements 4) Functional ability including ADL's, fall risk, home safety risk, hearing and visual impairment 5) Diet and physical activities 6) Evidence for depression or mood disorder 7) The patient's height, weight, and BMI have been  recorded in the chart  I have made referrals, and provided counseling and education based on review of the above      Essential hypertension, benign    Well controlled on current regimen. Renal function stable, no changes today.  Lab Results  Component Value Date   CREATININE 0.65 04/28/2021   Lab Results  Component Value Date   NA 139 04/28/2021   K 4.3 04/28/2021   CL 105 04/28/2021   CO2 27 04/28/2021         Hyperlipidemia with target LDL less than 100    Based on current lipid profile, the risk of clinically significant Coronary artery disease is 3% over the next 10 years, using the Framingham risk calculator.  Diet and exercise advised.   Lab Results  Component Value Date   CHOL 273 (H) 04/28/2021   HDL 65.90 04/28/2021   LDLCALC 185 (H) 04/28/2021   LDLDIRECT 154.1 02/23/2013   TRIG 106.0 04/28/2021   CHOLHDL 4 04/28/2021         Relevant Orders   Lipid panel (Completed)   Obesity    I have addressed  BMI and recommended wt loss of 10% of body weight over the next 6 months using a low glycemic index diet and regular exercise a minimum of 5 days per week.        Otitis media with effusion, bilateral    With complete but transient bilateral hearing loss.Occurred during a prolonged period of congestion during renovation of farm house.   Required right myringotomy tubes       Paroxysmal supraventricular tachycardia ([facility])   Relevant Orders   TSH (Completed)   Comprehensive metabolic panel (Completed)   Magnesium (Completed)   Pure hypercholesterolemia    Based on current lipid profile, the risk of clinically significant Coronary artery disease is 6% over the next 10 years, using the Framingham risk calculator.  Diet and exercise advised.   Lab Results  Component Value Date   CHOL 273 (H) 04/28/2021   HDL 65.90 04/28/2021   LDLCALC 185 (H) 04/28/2021   LDLDIRECT 154.1 02/23/2013   TRIG 106.0 04/28/2021   CHOLHDL 4 04/28/2021        Skin neoplasm     Chest wall,  Appears to be a basal cell carcinoma.  Referral to Dr. Kellie Moor in progress.       Relevant Orders   Ambulatory referral to Dermatology    Other Visit Diagnoses    Screen for STD (sexually transmitted disease)       Relevant Orders   HIV Antibody (routine testing w rflx) (Completed)   Hepatitis C antibody (Completed)   COVID-19 vaccine series completed       Relevant Orders   SARS-CoV-2 Semi-Quantitative Total Antibody, Spike   Vitamin D deficiency       Relevant Orders   VITAMIN D 25 Hydroxy (Vit-D Deficiency, Fractures) (Completed)      I have discontinued Santia D. Siebel's predniSONE, amoxicillin-clavulanate, and predniSONE. I am also having her maintain her aspirin, ibuprofen, diltiazem, furosemide, and metoprolol succinate.  No orders of the defined types were  placed in this encounter.   Medications Discontinued During This Encounter  Medication Reason  . amoxicillin-clavulanate (AUGMENTIN) 875-125 MG tablet   . predniSONE (DELTASONE) 10 MG tablet   . predniSONE (DELTASONE) 10 MG tablet     Follow-up: No follow-ups on file.   Crecencio Mc, MD

## 2021-04-28 NOTE — Patient Instructions (Addendum)
For your SVT,  Take the cardizem for any run lasting longer than 15-20 minutes  Health Maintenance for Postmenopausal Women Menopause is a normal process in which your ability to get pregnant comes to an end. This process happens slowly over many months or years, usually between the ages of 52 and 40. Menopause is complete when you have missed your menstrual periods for 12 months. It is important to talk with your health care provider about some of the most common conditions that affect women after menopause (postmenopausal women). These include heart disease, cancer, and bone loss (osteoporosis). Adopting a healthy lifestyle and getting preventive care can help to promote your health and wellness. The actions you take can also lower your chances of developing some of these common conditions. What should I know about menopause? During menopause, you may get a number of symptoms, such as:  Hot flashes. These can be moderate or severe.  Night sweats.  Decrease in sex drive.  Mood swings.  Headaches.  Tiredness.  Irritability.  Memory problems.  Insomnia. Choosing to treat or not to treat these symptoms is a decision that you make with your health care provider. Do I need hormone replacement therapy?  Hormone replacement therapy is effective in treating symptoms that are caused by menopause, such as hot flashes and night sweats.  Hormone replacement carries certain risks, especially as you become older. If you are thinking about using estrogen or estrogen with progestin, discuss the benefits and risks with your health care provider. What is my risk for heart disease and stroke? The risk of heart disease, heart attack, and stroke increases as you age. One of the causes may be a change in the body's hormones during menopause. This can affect how your body uses dietary fats, triglycerides, and cholesterol. Heart attack and stroke are medical emergencies. There are many things that you can  do to help prevent heart disease and stroke. Watch your blood pressure  High blood pressure causes heart disease and increases the risk of stroke. This is more likely to develop in people who have high blood pressure readings, are of African descent, or are overweight.  Have your blood pressure checked: ? Every 3-5 years if you are 25-12 years of age. ? Every year if you are 27 years old or older. Eat a healthy diet  Eat a diet that includes plenty of vegetables, fruits, low-fat dairy products, and lean protein.  Do not eat a lot of foods that are high in solid fats, added sugars, or sodium.   Get regular exercise Get regular exercise. This is one of the most important things you can do for your health. Most adults should:  Try to exercise for at least 150 minutes each week. The exercise should increase your heart rate and make you sweat (moderate-intensity exercise).  Try to do strengthening exercises at least twice each week. Do these in addition to the moderate-intensity exercise.  Spend less time sitting. Even light physical activity can be beneficial. Other tips  Work with your health care provider to achieve or maintain a healthy weight.  Do not use any products that contain nicotine or tobacco, such as cigarettes, e-cigarettes, and chewing tobacco. If you need help quitting, ask your health care provider.  Know your numbers. Ask your health care provider to check your cholesterol and your blood sugar (glucose). Continue to have your blood tested as directed by your health care provider. Do I need screening for cancer? Depending on your health history  and family history, you may need to have cancer screening at different stages of your life. This may include screening for:  Breast cancer.  Cervical cancer.  Lung cancer.  Colorectal cancer. What is my risk for osteoporosis? After menopause, you may be at increased risk for osteoporosis. Osteoporosis is a condition in which  bone destruction happens more quickly than new bone creation. To help prevent osteoporosis or the bone fractures that can happen because of osteoporosis, you may take the following actions:  If you are 57-65 years old, get at least 1,000 mg of calcium and at least 600 mg of vitamin D per day.  If you are older than age 54 but younger than age 66, get at least 1,200 mg of calcium and at least 600 mg of vitamin D per day.  If you are older than age 51, get at least 1,200 mg of calcium and at least 800 mg of vitamin D per day. Smoking and drinking excessive alcohol increase the risk of osteoporosis. Eat foods that are rich in calcium and vitamin D, and do weight-bearing exercises several times each week as directed by your health care provider. How does menopause affect my mental health? Depression may occur at any age, but it is more common as you become older. Common symptoms of depression include:  Low or sad mood.  Changes in sleep patterns.  Changes in appetite or eating patterns.  Feeling an overall lack of motivation or enjoyment of activities that you previously enjoyed.  Frequent crying spells. Talk with your health care provider if you think that you are experiencing depression. General instructions See your health care provider for regular wellness exams and vaccines. This may include:  Scheduling regular health, dental, and eye exams.  Getting and maintaining your vaccines. These include: ? Influenza vaccine. Get this vaccine each year before the flu season begins. ? Pneumonia vaccine. ? Shingles vaccine. ? Tetanus, diphtheria, and pertussis (Tdap) booster vaccine. Your health care provider may also recommend other immunizations. Tell your health care provider if you have ever been abused or do not feel safe at home. Summary  Menopause is a normal process in which your ability to get pregnant comes to an end.  This condition causes hot flashes, night sweats, decreased  interest in sex, mood swings, headaches, or lack of sleep.  Treatment for this condition may include hormone replacement therapy.  Take actions to keep yourself healthy, including exercising regularly, eating a healthy diet, watching your weight, and checking your blood pressure and blood sugar levels.  Get screened for cancer and depression. Make sure that you are up to date with all your vaccines. This information is not intended to replace advice given to you by your health care provider. Make sure you discuss any questions you have with your health care provider. Document Revised: 11/30/2018 Document Reviewed: 11/30/2018 Elsevier Patient Education  2021 [employer].

## 2021-04-28 NOTE — Assessment & Plan Note (Addendum)
I have addressed  BMI and recommended wt loss of 10% of body weight over the next 6 months using a low glycemic index diet and regular exercise a minimum of 5 days per week.   

## 2021-04-28 NOTE — Assessment & Plan Note (Addendum)
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   Required right myringotomy tubes

## 2021-04-28 NOTE — Assessment & Plan Note (Signed)

## 2021-04-29 LAB — HIV ANTIBODY (ROUTINE TESTING W REFLEX): HIV 1&2 Ab, 4th Generation: NONREACTIVE

## 2021-04-29 LAB — HEPATITIS C ANTIBODY
Hepatitis C Ab: NONREACTIVE
SIGNAL TO CUT-OFF: 0.01 (ref <1.00)

## 2021-04-29 NOTE — Assessment & Plan Note (Signed)
Based on current lipid profile, the risk of clinically significant Coronary artery disease is 6% over the next 10 years, using the Framingham risk calculator.  Diet and exercise advised.   Lab Results  Component Value Date   CHOL 273 (H) 04/28/2021   HDL 65.90 04/28/2021   LDLCALC 185 (H) 04/28/2021   LDLDIRECT 154.1 02/23/2013   TRIG 106.0 04/28/2021   CHOLHDL 4 04/28/2021

## 2021-04-29 NOTE — Assessment & Plan Note (Signed)
Well controlled on current regimen. Renal function stable, no changes today.  Lab Results  Component Value Date   CREATININE 0.65 04/28/2021   Lab Results  Component Value Date   NA 139 04/28/2021   K 4.3 04/28/2021   CL 105 04/28/2021   CO2 27 04/28/2021

## 2021-04-29 NOTE — Assessment & Plan Note (Signed)
Based on current lipid profile, the risk of clinically significant Coronary artery disease is 3% over the next 10 years, using the Framingham risk calculator.  Diet and exercise advised.   Lab Results  Component Value Date   CHOL 273 (H) 04/28/2021   HDL 65.90 04/28/2021   LDLCALC 185 (H) 04/28/2021   LDLDIRECT 154.1 02/23/2013   TRIG 106.0 04/28/2021   CHOLHDL 4 04/28/2021

## 2021-04-30 ENCOUNTER — Other Ambulatory Visit: Payer: Self-pay | Admitting: Internal Medicine

## 2021-04-30 ENCOUNTER — Encounter: Payer: 59 | Admitting: Internal Medicine

## 2021-04-30 ENCOUNTER — Other Ambulatory Visit: Payer: Self-pay

## 2021-04-30 DIAGNOSIS — D492 Neoplasm of unspecified behavior of bone, soft tissue, and skin: Secondary | ICD-10-CM | POA: Insufficient documentation

## 2021-04-30 MED ORDER — ERGOCALCIFEROL 1.25 MG (50000 UT) PO CAPS
50000.0000 [IU] | ORAL_CAPSULE | ORAL | 0 refills | Status: DC
  Filled 2021-04-30: qty 12, 84d supply, fill #0

## 2021-04-30 NOTE — Assessment & Plan Note (Signed)
Chest wall,  Appears to be a basal cell carcinoma.  Referral to Dr. Kellie Moor in progress.

## 2021-04-30 NOTE — Progress Notes (Signed)
Your CBC, thyroid ,cholesterol,  liver and kidney function are normal.  You are not anemic or prediabetic      However, Your vitamin D is low again.   I am calling in a megadose of Vit D to take once weekly for a total of 3 months.  after you finish the weekly Vitamin D supplement, you should start taking an OTC  Vit D3 supplement 1000 units daily, November through April. [facility],      Deborra Medina, MD      Regards,  Dr. Derrel Nip

## 2021-04-30 NOTE — Addendum Note (Signed)
Addended by: Crecencio Mc on: 04/30/2021 08:44 AM   Modules accepted: Orders

## 2021-05-01 LAB — SARS-COV-2 SEMI-QUANTITATIVE TOTAL ANTIBODY, SPIKE: SARS COV2 AB, Total Spike Semi QN: 616 U/mL — ABNORMAL HIGH (ref <0.8)

## 2021-05-04 ENCOUNTER — Other Ambulatory Visit: Payer: Self-pay | Admitting: Internal Medicine

## 2021-05-04 DIAGNOSIS — D492 Neoplasm of unspecified behavior of bone, soft tissue, and skin: Secondary | ICD-10-CM

## 2021-05-05 ENCOUNTER — Telehealth: Payer: Self-pay | Admitting: Internal Medicine

## 2021-05-05 NOTE — Telephone Encounter (Signed)
Patient cancelled her apt that was for 08/20/21 and did not reschedule ASC 05/05/21  msg from [facility]

## 2021-07-08 ENCOUNTER — Other Ambulatory Visit: Payer: Self-pay | Admitting: Internal Medicine

## 2021-07-08 ENCOUNTER — Other Ambulatory Visit: Payer: Self-pay

## 2021-07-08 DIAGNOSIS — I10 Essential (primary) hypertension: Secondary | ICD-10-CM

## 2021-07-08 MED ORDER — METOPROLOL SUCCINATE ER 100 MG PO TB24
ORAL_TABLET | Freq: Every day | ORAL | 1 refills | Status: DC
  Filled 2021-07-08: qty 90, 90d supply, fill #0
  Filled 2021-09-29: qty 90, 90d supply, fill #1

## 2021-08-20 ENCOUNTER — Ambulatory Visit: Payer: 59 | Admitting: Dermatology

## 2021-09-29 ENCOUNTER — Other Ambulatory Visit: Payer: Self-pay

## 2021-10-28 ENCOUNTER — Ambulatory Visit (INDEPENDENT_AMBULATORY_CARE_PROVIDER_SITE_OTHER): Payer: 59 | Admitting: Dermatology

## 2021-10-28 ENCOUNTER — Other Ambulatory Visit: Payer: Self-pay

## 2021-10-28 DIAGNOSIS — D1801 Hemangioma of skin and subcutaneous tissue: Secondary | ICD-10-CM | POA: Diagnosis not present

## 2021-10-28 DIAGNOSIS — L821 Other seborrheic keratosis: Secondary | ICD-10-CM

## 2021-10-28 DIAGNOSIS — L814 Other melanin hyperpigmentation: Secondary | ICD-10-CM

## 2021-10-28 DIAGNOSIS — L578 Other skin changes due to chronic exposure to nonionizing radiation: Secondary | ICD-10-CM

## 2021-10-28 DIAGNOSIS — L820 Inflamed seborrheic keratosis: Secondary | ICD-10-CM

## 2021-10-28 NOTE — Progress Notes (Signed)
   New Patient Visit  Subjective  Carla Mckinney is a 60 y.o. female who presents for the following: Skin Problem (New patient here today to have 2 spots checked. Spot at chest, present for about 6 months, asymptomatic. Spot at right temple, new in the last month, asymptomatic. No personal or fhx skin cancer. ).   The following portions of the chart were reviewed this encounter and updated as appropriate:       Review of Systems:  No other skin or systemic complaints except as noted in HPI or Assessment and Plan.  Objective  Well appearing patient in no apparent distress; mood and affect are within normal limits.  A focused examination was performed including back, face, chest. Relevant physical exam findings are noted in the Assessment and Plan.  mid chest 63mm pink white slightly waxy papule   Assessment & Plan  Inflamed seborrheic keratosis mid chest  Vs early BCC at mid chest  Patient to RTC if does not clear.   Destruction of lesion - mid chest  Destruction method: cryotherapy   Informed consent: discussed and consent obtained   Lesion destroyed using liquid nitrogen: Yes   Region frozen until ice ball extended beyond lesion: Yes   Outcome: patient tolerated procedure well with no complications   Post-procedure details: wound care instructions given   Additional details:  Prior to procedure, discussed risks of blister formation, small wound, skin dyspigmentation, or rare scar following cryotherapy. Recommend Vaseline ointment to treated areas while healing.   Seborrheic Keratoses - Stuck-on, waxy, tan-brown papule R temple - Benign-appearing - Discussed benign etiology and prognosis. - Observe, Discussed cryotherapy if spot becomes irritated or inflamed.  - Call for any changes     Actinic Damage - chronic, secondary to cumulative UV radiation exposure/sun exposure over time - diffuse scaly erythematous macules with underlying dyspigmentation - Recommend daily  broad spectrum sunscreen SPF 30+ to sun-exposed areas, reapply every 2 hours as needed.  - Recommend staying in the shade or wearing long sleeves, sun glasses (UVA+UVB protection) and wide brim hats (4-inch brim around the entire circumference of the hat). - Call for new or changing lesions.  Lentigines - Scattered tan macules - Due to sun exposure - Benign-appering, observe - Recommend daily broad spectrum sunscreen SPF 30+ to sun-exposed areas, reapply every 2 hours as needed. - Call for any changes  Hemangiomas - Red papules - Discussed benign nature - Observe - Call for any changes  Return if symptoms worsen or fail to improve.  Graciella Belton, RMA, am acting as scribe for Brendolyn Patty, MD .  Documentation: I have reviewed the above documentation for accuracy and completeness, and I agree with the above.  Brendolyn Patty MD

## 2021-10-28 NOTE — Patient Instructions (Addendum)
Cryotherapy Aftercare  Wash gently with soap and water everyday.   Apply Vaseline and Band-Aid daily until healed.    Seborrheic Keratosis  What causes seborrheic keratoses? Seborrheic keratoses are harmless, common skin growths that first appear during adult life.  As time goes by, more growths appear.  Some people may develop a large number of them.  Seborrheic keratoses appear on both covered and uncovered body parts.  They are not caused by sunlight.  The tendency to develop seborrheic keratoses can be inherited.  They vary in color from skin-colored to gray, brown, or even black.  They can be either smooth or have a rough, warty surface.   Seborrheic keratoses are superficial and look as if they were stuck on the skin.  Under the microscope this type of keratosis looks like layers upon layers of skin.  That is why at times the top layer may seem to fall off, but the rest of the growth remains and re-grows.    Treatment Seborrheic keratoses do not need to be treated, but can easily be removed in the office.  Seborrheic keratoses often cause symptoms when they rub on clothing or jewelry.  Lesions can be in the way of shaving.  If they become inflamed, they can cause itching, soreness, or burning.  Removal of a seborrheic keratosis can be accomplished by freezing, burning, or surgery. If any spot bleeds, scabs, or grows rapidly, please return to have it checked, as these can be an indication of a skin cancer.  Recommend daily broad spectrum sunscreen SPF 30+ to sun-exposed areas, reapply every 2 hours as needed. Call for new or changing lesions.  Staying in the shade or wearing long sleeves, sun glasses (UVA+UVB protection) and wide brim hats (4-inch brim around the entire circumference of the hat) are also recommended for sun protection.   If you have any questions or concerns for your doctor, please call our main line at [phone] and press option 4 to reach your doctor's medical assistant.  If no one answers, please leave a voicemail as directed and we will return your call as soon as possible. Messages left after 4 pm will be answered the following business day.   You may also send [place] a message via [place]. We typically respond to MyChart messages within 1-2 business days.  For prescription refills, please ask your pharmacy to contact our office. Our fax number is [phone].  If you have an urgent issue when the clinic is closed that cannot wait until the next business day, you can page your doctor at the number below.    Please note that while we do our best to be available for urgent issues outside of office hours, we are not available 24/7.   If you have an urgent issue and are unable to reach [place], you may choose to seek medical care at your doctor's office, retail clinic, urgent care center, or emergency room.  If you have a medical emergency, please immediately call 911 or go to the emergency department.  Pager Numbers  - Dr. Nehemiah Massed: [phone]  - Dr. Laurence Ferrari: [phone]  - Dr. Nicole Kindred: [phone]  In the event of inclement weather, please call our main line at [phone] for an update on the status of any delays or closures.  Dermatology Medication Tips: Please keep the boxes that topical medications come in in order to help keep track of the instructions about where and how to use these. Pharmacies typically print the medication instructions only on the  boxes and not directly on the medication tubes.   If your medication is too expensive, please contact our office at [phone] option 4 or send [place] a message through [place].   We are unable to tell what your co-pay for medications will be in advance as this is different depending on your insurance coverage. However, we may be able to find a substitute medication at lower cost or fill out paperwork to get insurance to cover a needed medication.   If a prior authorization is required to get your medication  covered by your insurance company, please allow [place] 1-2 business days to complete this process.  Drug prices often vary depending on where the prescription is filled and some pharmacies may offer cheaper prices.  The website www.goodrx.com contains coupons for medications through different pharmacies. The prices here do not account for what the cost may be with help from insurance (it may be cheaper with your insurance), but the website can give you the price if you did not use any insurance.  - You can print the associated coupon and take it with your prescription to the pharmacy.  - You may also stop by our office during regular business hours and pick up a GoodRx coupon card.  - If you need your prescription sent electronically to a different pharmacy, notify our office through [facility] or by phone at [phone] option 4.

## 2021-12-29 ENCOUNTER — Other Ambulatory Visit: Payer: Self-pay | Admitting: Internal Medicine

## 2021-12-29 DIAGNOSIS — I10 Essential (primary) hypertension: Secondary | ICD-10-CM

## 2021-12-30 ENCOUNTER — Other Ambulatory Visit: Payer: Self-pay | Admitting: Internal Medicine

## 2021-12-30 ENCOUNTER — Other Ambulatory Visit: Payer: Self-pay

## 2021-12-30 DIAGNOSIS — Z1231 Encounter for screening mammogram for malignant neoplasm of breast: Secondary | ICD-10-CM

## 2021-12-30 MED ORDER — METOPROLOL SUCCINATE ER 100 MG PO TB24
ORAL_TABLET | Freq: Every day | ORAL | 0 refills | Status: DC
  Filled 2021-12-30: qty 90, 90d supply, fill #0

## 2022-01-13 DIAGNOSIS — H43812 Vitreous degeneration, left eye: Secondary | ICD-10-CM | POA: Diagnosis not present

## 2022-01-28 ENCOUNTER — Ambulatory Visit: Payer: 59 | Admitting: Internal Medicine

## 2022-01-28 ENCOUNTER — Encounter: Payer: Self-pay | Admitting: Internal Medicine

## 2022-01-28 ENCOUNTER — Other Ambulatory Visit: Payer: Self-pay

## 2022-01-28 VITALS — BP 130/86 | HR 77 | Temp 98.6°F | Ht 63.0 in | Wt 188.2 lb

## 2022-01-28 DIAGNOSIS — E559 Vitamin D deficiency, unspecified: Secondary | ICD-10-CM | POA: Diagnosis not present

## 2022-01-28 DIAGNOSIS — I471 Supraventricular tachycardia: Secondary | ICD-10-CM | POA: Diagnosis not present

## 2022-01-28 DIAGNOSIS — E785 Hyperlipidemia, unspecified: Secondary | ICD-10-CM | POA: Diagnosis not present

## 2022-01-28 DIAGNOSIS — Z1389 Encounter for screening for other disorder: Secondary | ICD-10-CM | POA: Diagnosis not present

## 2022-01-28 DIAGNOSIS — I10 Essential (primary) hypertension: Secondary | ICD-10-CM | POA: Diagnosis not present

## 2022-01-28 DIAGNOSIS — I491 Atrial premature depolarization: Secondary | ICD-10-CM

## 2022-01-28 MED ORDER — SPIRONOLACTONE 25 MG PO TABS
25.0000 mg | ORAL_TABLET | Freq: Every day | ORAL | 0 refills | Status: DC
  Filled 2022-01-28: qty 30, 30d supply, fill #0

## 2022-01-28 NOTE — Progress Notes (Signed)
Chief Complaint  Patient presents with   Blood Pressure Check   F/u  1. Htn elevated at times 182/100 on toprol xl 100 mg qhs, dilt 30 prn for PACs h/o SVVT x 2 days with h/a elevated BP echo she had in 07/20/19 with Dr. Ubaldo Glassing. FH heart disease   INTERPRETATION  Normal Stress Echocardiogram  NORMAL RIGHT VENTRICULAR SYSTOLIC FUNCTION  MILD VALVULAR REGURGITATION (See above)  NO VALVULAR STENOSIS NOTED  Resting EF: >55% (Est.)  Post Stress EF: >55% (Est.)  Mitral: MILD MR  Tricuspid: MILD TR  2. Frontal h/a nothing tried    Review of Systems  Constitutional:  Negative for weight loss.  HENT:  Negative for hearing loss.   Eyes:  Negative for blurred vision.  Respiratory:  Negative for shortness of breath.   Cardiovascular:  Negative for chest pain.  Gastrointestinal:  Negative for abdominal pain and blood in stool.  Genitourinary:  Negative for dysuria.  Musculoskeletal:  Negative for falls and joint pain.  Skin:  Negative for rash.  Neurological:  Positive for headaches.  Psychiatric/Behavioral:  Negative for depression.   Past Medical History:  Diagnosis Date   Hypertension    Rheumatic fever 1986   has had echo since then. no cardiac issues.   Past Surgical History:  Procedure Laterality Date   ABDOMINAL HYSTERECTOMY     AUGMENTATION MAMMAPLASTY Bilateral    breast lift   CARDIAC CATHETERIZATION  2002   normal, due to syncope and abnormal myowiew ([place])   COLONOSCOPY WITH PROPOFOL N/A 09/15/2017   Procedure: COLONOSCOPY WITH PROPOFOL;  Surgeon: Lin Landsman, MD;  Location: [facility];  Service: Gastroenterology;  Laterality: N/A;   MASTOPEXY  2008   OOPHORECTOMY     secondary to scar tissue,  [facility]   TONSILLECTOMY     TUBAL LIGATION     Family History  Problem Relation Age of Onset   Heart disease Mother    Heart failure Mother    Hypertension Father    Hyperlipidemia Father    Heart disease Maternal Grandfather    Heart disease Paternal  Grandmother    Stroke Paternal Grandmother 1   Cancer Paternal Grandfather        multiple myeloma   Breast cancer Neg Hx    Social History   Socioeconomic History   Marital status: Married    Spouse name: Not on file   Number of children: 2   Years of education: Not on file   Highest education level: Not on file  Occupational History   Not on file  Tobacco Use   Smoking status: Never   Smokeless tobacco: Never  Vaping Use   Vaping Use: Never used  Substance and Sexual Activity   Alcohol use: Yes    Alcohol/week: 1.0 standard drink    Types: 1 Glasses of wine per week    Comment: socially   Drug use: No   Sexual activity: Yes  Other Topics Concern   Not on file  Social History Narrative   Not on file   Social Determinants of Health   Financial Resource Strain: Not on file  Food Insecurity: Not on file  Transportation Needs: Not on file  Physical Activity: Not on file  Stress: Not on file  Social Connections: Not on file  Intimate Partner Violence: Not on file   Current Meds  Medication Sig   aspirin 81 MG tablet Take 81 mg by mouth daily.   diltiazem (CARDIZEM) 30 MG tablet Take  1 tablet (30 mg total) by mouth as needed (rapid heart rate).   ergocalciferol (DRISDOL) 1.25 MG (50000 UT) capsule Take 1 capsule (50,000 Units total) by mouth once a week.   ibuprofen (ADVIL,MOTRIN) 200 MG tablet Take 200 mg by mouth as needed.   metoprolol succinate (TOPROL-XL) 100 MG 24 hr tablet TAKE 1 TABLET BY MOUTH DAILY   spironolactone (ALDACTONE) 25 MG tablet Take 1 tablet (25 mg total) by mouth daily.   Allergies  Allergen Reactions   Lisinopril Swelling   No results found for this or any previous visit (from the past 2160 hour(s)). Objective  Body mass index is 33.34 kg/m. Wt Readings from Last 3 Encounters:  01/28/22 188 lb 3.2 oz (85.4 kg)  04/28/21 187 lb 12.8 oz (85.2 kg)  12/18/20 180 lb (81.6 kg)   Temp Readings from Last 3 Encounters:  01/28/22 98.6 F (37  C) (Oral)  04/28/21 98.3 F (36.8 C) (Oral)  12/12/20 99 F (37.2 C) (Oral)   BP Readings from Last 3 Encounters:  01/28/22 130/86  04/28/21 110/72  12/12/20 131/90   Pulse Readings from Last 3 Encounters:  01/28/22 77  04/28/21 66  12/12/20 71    Physical Exam Vitals and nursing note reviewed.  Constitutional:      Appearance: Normal appearance. She is well-developed and well-groomed.  HENT:     Head: Normocephalic and atraumatic.  Eyes:     Conjunctiva/sclera: Conjunctivae normal.     Pupils: Pupils are equal, round, and reactive to light.  Cardiovascular:     Rate and Rhythm: Normal rate and regular rhythm.     Heart sounds: Normal heart sounds. No murmur heard. Pulmonary:     Effort: Pulmonary effort is normal.     Breath sounds: Normal breath sounds.  Abdominal:     General: Abdomen is flat. Bowel sounds are normal.     Tenderness: There is no abdominal tenderness.  Musculoskeletal:        General: No tenderness.  Skin:    General: Skin is warm and dry.  Neurological:     General: No focal deficit present.     Mental Status: She is alert and oriented to person, place, and time. Mental status is at baseline.     Cranial Nerves: Cranial nerves 2-12 are intact.     Motor: Motor function is intact.     Coordination: Coordination is intact.     Gait: Gait is intact.  Psychiatric:        Attention and Perception: Attention and perception normal.        Mood and Affect: Mood and affect normal.        Speech: Speech normal.        Behavior: Behavior normal. Behavior is cooperative.        Thought Content: Thought content normal.        Cognition and Memory: Cognition and memory normal.        Judgment: Judgment normal.    Assessment  Plan  Hypertension, elevated, PACs, PSVT- Plan: Comprehensive metabolic panel, Lipid panel, CBC with Differential/Platelet Toprol xl 100 mg qhs  Dilt 30 qd prn  Add spironolactone 25 mg qd consider increase 50 mg if not  controlled in future  Monitor BP F/u with PCP 2-3 weeks in person    Vitamin D deficiency - Plan: Vitamin D (25 hydroxy)  Hyperlipidemia, unspecified hyperlipidemia type - Plan: Lipid panel   Provider: Dr. Olivia Mackie McLean-[facility]oc[facility]

## 2022-01-28 NOTE — Patient Instructions (Signed)
Omron blood pressure cuff   Spironolactone Tablets What is this medication? SPIRONOLACTONE (speer on [place] LAK tone) treats high blood pressure and heart failure. It may also be used to reduce swelling related to heart, kidney, or liver disease. It helps your kidneys remove more fluid and salt from your blood through the urine without losing too much potassium. It belongs to a group of medications called diuretics. This medicine may be used for other purposes; ask your health care provider or pharmacist if you have questions. COMMON BRAND NAME(S): Aldactone What should I tell my care team before I take this medication? They need to know if you have any of these conditions: Addison's disease or low adrenal gland function High blood level of potassium Kidney disease Liver disease An unusual or allergic reaction to spironolactone, other medications, foods, dyes, or preservatives Pregnant or trying to get pregnant Breast-feeding How should I use this medication? Take this medication by mouth. Take it as directed on the prescription label at the same time every day. You can take it with or without food. You should always take it the same way. Keep taking it unless your care team tells you to stop. Talk to your care team about the use of this medication in children. Special care may be needed. Overdosage: If you think you have taken too much of this medicine contact a poison control center or emergency room at once. NOTE: This medicine is only for you. Do not share this medicine with others. What if I miss a dose? If you miss a dose, take it as soon as you can. If it is almost time for your next dose, take only that dose. Do not take double or extra doses. What may interact with this medication? Do not take this medication with any of the following: Cidofovir Eplerenone Tranylcypromine This medication may also interact with the following: Aspirin Certain medications for blood pressure or heart  disease like benazepril, lisinopril, losartan, valsartan Certain medications that treat or prevent blood clots like heparin and enoxaparin Cholestyramine Cyclosporine Digoxin Lithium Medications that relax muscles for surgery NSAIDs, medications for pain and inflammation, like ibuprofen or naproxen Other diuretics Potassium salts or supplements Steroid medications like prednisone or cortisone Trimethoprim This list may not describe all possible interactions. Give your health care provider a list of all the medicines, herbs, non-prescription drugs, or dietary supplements you use. Also tell them if you smoke, drink alcohol, or use illegal drugs. Some items may interact with your medicine. What should I watch for while using this medication? Visit your care team for regular checks on your progress. Check your blood pressure as directed. Ask your care team what your blood pressure should be. Also, find out when you should contact him or her. Do not treat yourself for coughs, colds, or pain while you are using this medication without asking your care team for advice. Some medications may increase your blood pressure. Check with your care team if you have severe diarrhea, nausea, and vomiting, or if you sweat a lot. The loss of too much body fluid may make it dangerous for you to take this medication. You may need to be on a special diet while taking this medication. Ask your care team. Also, find out how many glasses of fluid you need to drink each day. You may get drowsy or dizzy. Do not drive, use machinery, or do anything that needs mental alertness until you know how this medication affects you. Do not stand or sit  up quickly, especially if you are an older patient. This reduces the risk of dizzy or fainting spells. Alcohol may interfere with the effects of this medication. Avoid alcoholic drinks. Avoid salt substitutes unless you are told otherwise by your care team. What side effects may I  notice from receiving this medication? Side effects that you should report to your care team as soon as possible: Allergic reactions--skin rash, itching, hives, swelling of the face, lips, tongue, or throat Dehydration--increased thirst, dry mouth, feeling faint or lightheaded, headache, dark yellow or brown urine High potassium level--muscle weakness, fast or irregular heartbeat Kidney injury--decrease in the amount of urine, swelling of the ankles, hands, or feet Low blood pressure--dizziness, feeling faint or lightheaded, blurry vision Low sodium level--muscle weakness, fatigue, dizziness, headache, confusion Side effects that usually do not require medical attention (report to your care team if they continue or are bothersome): Breast pain or tenderness Changes in sex drive or performance Dizziness Headache Irregular menstrual cycles or spotting Unexpected breast tissue growth This list may not describe all possible side effects. Call your doctor for medical advice about side effects. You may report side effects to FDA at 1-800-FDA-1088. Where should I keep my medication? Keep out of the reach of children and pets. Store below 25 degrees C (77 degrees F). Get rid of any unused medication after the expiration date. To get rid of medications that are no longer needed or have expired: Take the medication to a medication take-back program. Check with your pharmacy or law enforcement to find a location. If you cannot return the medication, check the label or package insert to see if the medication should be thrown out in the garbage or flushed down the toilet. If you are not sure, ask your care team. If it is safe to put into the trash, take the medication out of the container. Mix the medication with cat litter, dirt, coffee grounds, or other unwanted substance. Seal the mixture in a bag or container. Put it in the trash. NOTE: This sheet is a summary. It may not cover all possible information.  If you have questions about this medicine, talk to your doctor, pharmacist, or health care provider.  2022 Elsevier/Gold Standard (2021-08-26 00:00:00)    DASH Eating Plan DASH stands for Dietary Approaches to Stop Hypertension. The DASH eating plan is a healthy eating plan that has been shown to: Reduce high blood pressure (hypertension). Reduce your risk for type 2 diabetes, heart disease, and stroke. Help with weight loss. What are tips for following this plan? Reading food labels Check food labels for the amount of salt (sodium) per serving. Choose foods with less than 5 percent of the Daily Value of sodium. Generally, foods with less than 300 milligrams (mg) of sodium per serving fit into this eating plan. To find whole grains, look for the word "whole" as the first word in the ingredient list. Shopping Buy products labeled as "low-sodium" or "no salt added." Buy fresh foods. Avoid canned foods and pre-made or frozen meals. Cooking Avoid adding salt when cooking. Use salt-free seasonings or herbs instead of table salt or sea salt. Check with your health care provider or pharmacist before using salt substitutes. Do not fry foods. Cook foods using healthy methods such as baking, boiling, grilling, roasting, and broiling instead. Cook with heart-healthy oils, such as olive, canola, avocado, soybean, or sunflower oil. Meal planning  Eat a balanced diet that includes: 4 or more servings of fruits and 4 or more servings  of vegetables each day. Try to fill one-half of your plate with fruits and vegetables. 6-8 servings of whole grains each day. Less than 6 oz (170 g) of lean meat, poultry, or fish each day. A 3-oz (85-g) serving of meat is about the same size as a deck of cards. One egg equals 1 oz (28 g). 2-3 servings of low-fat dairy each day. One serving is 1 cup (237 mL). 1 serving of nuts, seeds, or beans 5 times each week. 2-3 servings of heart-healthy fats. Healthy fats called  omega-3 fatty acids are found in foods such as walnuts, flaxseeds, fortified milks, and eggs. These fats are also found in cold-water fish, such as sardines, salmon, and mackerel. Limit how much you eat of: Canned or prepackaged foods. Food that is high in trans fat, such as some fried foods. Food that is high in saturated fat, such as fatty meat. Desserts and other sweets, sugary drinks, and other foods with added sugar. Full-fat dairy products. Do not salt foods before eating. Do not eat more than 4 egg yolks a week. Try to eat at least 2 vegetarian meals a week. Eat more home-cooked food and less restaurant, buffet, and fast food. Lifestyle When eating at a restaurant, ask that your food be prepared with less salt or no salt, if possible. If you drink alcohol: Limit how much you use to: 0-1 drink a day for women who are not pregnant. 0-2 drinks a day for men. Be aware of how much alcohol is in your drink. In the [place] one drink equals one 12 oz bottle of beer (355 mL), one 5 oz glass of wine (148 mL), or one 1 oz glass of hard liquor (44 mL). General information Avoid eating more than 2,300 mg of salt a day. If you have hypertension, you may need to reduce your sodium intake to 1,500 mg a day. Work with your health care provider to maintain a healthy body weight or to lose weight. Ask what an ideal weight is for you. Get at least 30 minutes of exercise that causes your heart to beat faster (aerobic exercise) most days of the week. Activities may include walking, swimming, or biking. Work with your health care provider or dietitian to adjust your eating plan to your individual calorie needs. What foods should I eat? Fruits All fresh, dried, or frozen fruit. Canned fruit in natural juice (without added sugar). Vegetables Fresh or frozen vegetables (raw, steamed, roasted, or grilled). Low-sodium or reduced-sodium tomato and vegetable juice. Low-sodium or reduced-sodium tomato sauce and  tomato paste. Low-sodium or reduced-sodium canned vegetables. Grains Whole-grain or whole-wheat bread. Whole-grain or whole-wheat pasta. Brown rice. [place]. Bulgur. Whole-grain and low-sodium cereals. Pita bread. Low-fat, low-sodium crackers. Whole-wheat flour tortillas. Meats and other proteins Skinless chicken or [place]. Ground chicken or [place]. Pork with fat trimmed off. Fish and seafood. Egg whites. Dried beans, peas, or lentils. Unsalted nuts, nut butters, and seeds. Unsalted canned beans. Lean cuts of beef with fat trimmed off. Low-sodium, lean precooked or cured meat, such as sausages or meat loaves. Dairy Low-fat (1%) or fat-free (skim) milk. Reduced-fat, low-fat, or fat-free cheeses. Nonfat, low-sodium ricotta or cottage cheese. Low-fat or nonfat yogurt. Low-fat, low-sodium cheese. Fats and oils Soft margarine without trans fats. Vegetable oil. Reduced-fat, low-fat, or light mayonnaise and salad dressings (reduced-sodium). Canola, safflower, olive, avocado, soybean, and sunflower oils. Avocado. Seasonings and condiments Herbs. Spices. Seasoning mixes without salt. Other foods Unsalted popcorn and pretzels. Fat-free sweets. The items listed  above may not be a complete list of foods and beverages you can eat. Contact a dietitian for more information. What foods should I avoid? Fruits Canned fruit in a light or heavy syrup. Fried fruit. Fruit in cream or butter sauce. Vegetables Creamed or fried vegetables. Vegetables in a cheese sauce. Regular canned vegetables (not low-sodium or reduced-sodium). Regular canned tomato sauce and paste (not low-sodium or reduced-sodium). Regular tomato and vegetable juice (not low-sodium or reduced-sodium). Angie Fava. Olives. Grains Baked goods made with fat, such as croissants, muffins, or some breads. Dry pasta or rice meal packs. Meats and other proteins Fatty cuts of meat. Ribs. Fried meat. Berniece Salines. Bologna, salami, and other precooked or cured  meats, such as sausages or meat loaves. Fat from the back of a pig (fatback). Bratwurst. Salted nuts and seeds. Canned beans with added salt. Canned or smoked fish. Whole eggs or egg yolks. Chicken or [place] with skin. Dairy Whole or 2% milk, cream, and half-and-half. Whole or full-fat cream cheese. Whole-fat or sweetened yogurt. Full-fat cheese. Nondairy creamers. Whipped toppings. Processed cheese and cheese spreads. Fats and oils Butter. Stick margarine. Lard. Shortening. Ghee. Bacon fat. Tropical oils, such as coconut, palm kernel, or palm oil. Seasonings and condiments Onion salt, garlic salt, seasoned salt, table salt, and sea salt. Worcestershire sauce. Tartar sauce. Barbecue sauce. Teriyaki sauce. Soy sauce, including reduced-sodium. Steak sauce. Canned and packaged gravies. Fish sauce. Oyster sauce. Cocktail sauce. Store-bought horseradish. Ketchup. Mustard. Meat flavorings and tenderizers. Bouillon cubes. Hot sauces. Pre-made or packaged marinades. Pre-made or packaged taco seasonings. Relishes. Regular salad dressings. Other foods Salted popcorn and pretzels. The items listed above may not be a complete list of foods and beverages you should avoid. Contact a dietitian for more information. Where to find more information National Heart, Lung, and Blood Institute: https://wilson-eaton.com/ American Heart Association: www.heart.org Academy of Nutrition and Dietetics: www.eatright.[place]: www.kidney.org Summary The DASH eating plan is a healthy eating plan that has been shown to reduce high blood pressure (hypertension). It may also reduce your risk for type 2 diabetes, heart disease, and stroke. When on the DASH eating plan, aim to eat more fresh fruits and vegetables, whole grains, lean proteins, low-fat dairy, and heart-healthy fats. With the DASH eating plan, you should limit salt (sodium) intake to 2,300 mg a day. If you have hypertension, you may need to reduce your  sodium intake to 1,500 mg a day. Work with your health care provider or dietitian to adjust your eating plan to your individual calorie needs. This information is not intended to replace advice given to you by your health care provider. Make sure you discuss any questions you have with your health care provider. Document Revised: 11/10/2019 Document Reviewed: 11/10/2019 Elsevier Patient Education  2022 [employer].

## 2022-02-11 ENCOUNTER — Ambulatory Visit
Admission: RE | Admit: 2022-02-11 | Discharge: 2022-02-11 | Disposition: A | Discharge status: Home / Self Care | Payer: 59 | Source: Ambulatory Visit | Attending: Internal Medicine | Admitting: Internal Medicine

## 2022-02-11 ENCOUNTER — Other Ambulatory Visit: Payer: Self-pay

## 2022-02-11 DIAGNOSIS — Z1231 Encounter for screening mammogram for malignant neoplasm of breast: Secondary | ICD-10-CM | POA: Diagnosis not present

## 2022-02-12 ENCOUNTER — Other Ambulatory Visit (INDEPENDENT_AMBULATORY_CARE_PROVIDER_SITE_OTHER): Payer: 59

## 2022-02-12 ENCOUNTER — Other Ambulatory Visit: Payer: Self-pay

## 2022-02-12 DIAGNOSIS — Z1389 Encounter for screening for other disorder: Secondary | ICD-10-CM

## 2022-02-12 DIAGNOSIS — E785 Hyperlipidemia, unspecified: Secondary | ICD-10-CM

## 2022-02-12 DIAGNOSIS — I10 Essential (primary) hypertension: Secondary | ICD-10-CM

## 2022-02-12 DIAGNOSIS — E559 Vitamin D deficiency, unspecified: Secondary | ICD-10-CM

## 2022-02-12 LAB — COMPREHENSIVE METABOLIC PANEL
ALT: 20 U/L (ref 0–35)
AST: 17 U/L (ref 0–37)
Albumin: 4.3 g/dL (ref 3.5–5.2)
Alkaline Phosphatase: 91 U/L (ref 39–117)
BUN: 9 mg/dL (ref 6–23)
CO2: 31 mEq/L (ref 19–32)
Calcium: 10.7 mg/dL — ABNORMAL HIGH (ref 8.4–10.5)
Chloride: 104 mEq/L (ref 96–112)
Creatinine, Ser: 0.66 mg/dL (ref 0.40–1.20)
GFR: 96.84 mL/min (ref >60.00)
Glucose, Bld: 85 mg/dL (ref 70–99)
Potassium: 4.6 mEq/L (ref 3.5–5.1)
Sodium: 139 mEq/L (ref 135–145)
Total Bilirubin: 0.6 mg/dL (ref 0.2–1.2)
Total Protein: 6.9 g/dL (ref 6.0–8.3)

## 2022-02-12 LAB — CBC WITH DIFFERENTIAL/PLATELET
Basophils Absolute: 0 10*3/uL (ref 0.0–0.1)
Basophils Relative: 0.7 % (ref 0.0–3.0)
Eosinophils Absolute: 0.1 10*3/uL (ref 0.0–0.7)
Eosinophils Relative: 2.6 % (ref 0.0–5.0)
HCT: 39.6 % (ref 36.0–46.0)
Hemoglobin: 13.2 g/dL (ref 12.0–15.0)
Lymphocytes Relative: 30.8 % (ref 12.0–46.0)
Lymphs Abs: 1.7 10*3/uL (ref 0.7–4.0)
MCHC: 33.4 g/dL (ref 30.0–36.0)
MCV: 92.7 fl (ref 78.0–100.0)
Monocytes Absolute: 0.4 10*3/uL (ref 0.1–1.0)
Monocytes Relative: 7.9 % (ref 3.0–12.0)
Neutro Abs: 3.3 10*3/uL (ref 1.4–7.7)
Neutrophils Relative %: 58 % (ref 43.0–77.0)
Platelets: 266 10*3/uL (ref 150.0–400.0)
RBC: 4.27 Mil/uL (ref 3.87–5.11)
RDW: 13.3 % (ref 11.5–15.5)
WBC: 5.6 10*3/uL (ref 4.0–10.5)

## 2022-02-12 LAB — LIPID PANEL
Cholesterol: 254 mg/dL — ABNORMAL HIGH (ref 0–200)
HDL: 65.3 mg/dL (ref >39.00)
LDL Cholesterol: 172 mg/dL — ABNORMAL HIGH (ref 0–99)
NonHDL: 188.43
Total CHOL/HDL Ratio: 4
Triglycerides: 81 mg/dL (ref 0.0–149.0)
VLDL: 16.2 mg/dL (ref 0.0–40.0)

## 2022-02-12 LAB — VITAMIN D 25 HYDROXY (VIT D DEFICIENCY, FRACTURES): VITD: 37.95 ng/mL (ref 30.00–100.00)

## 2022-02-13 LAB — URINALYSIS, ROUTINE W REFLEX MICROSCOPIC
Bacteria, UA: NONE SEEN /HPF
Bilirubin Urine: NEGATIVE
Glucose, UA: NEGATIVE
Hgb urine dipstick: NEGATIVE
Hyaline Cast: NONE SEEN /LPF
Ketones, ur: NEGATIVE
Nitrite: NEGATIVE
Protein, ur: NEGATIVE
RBC / HPF: NONE SEEN /HPF (ref 0–2)
Specific Gravity, Urine: 1.006 (ref 1.001–1.035)
Squamous Epithelial / HPF: NONE SEEN /HPF (ref <=5)
WBC, UA: NONE SEEN /HPF (ref 0–5)
pH: 7.5 (ref 5.0–8.0)

## 2022-02-13 LAB — MICROSCOPIC MESSAGE

## 2022-02-20 ENCOUNTER — Ambulatory Visit: Payer: 59 | Admitting: Internal Medicine

## 2022-02-20 ENCOUNTER — Encounter: Payer: Self-pay | Admitting: Internal Medicine

## 2022-02-20 ENCOUNTER — Other Ambulatory Visit: Payer: Self-pay

## 2022-02-20 DIAGNOSIS — E210 Primary hyperparathyroidism: Secondary | ICD-10-CM

## 2022-02-20 DIAGNOSIS — I10 Essential (primary) hypertension: Secondary | ICD-10-CM

## 2022-02-20 DIAGNOSIS — E559 Vitamin D deficiency, unspecified: Secondary | ICD-10-CM | POA: Diagnosis not present

## 2022-02-20 DIAGNOSIS — G4452 New daily persistent headache (NDPH): Secondary | ICD-10-CM

## 2022-02-20 DIAGNOSIS — I471 Supraventricular tachycardia: Secondary | ICD-10-CM

## 2022-02-20 LAB — VITAMIN D 25 HYDROXY (VIT D DEFICIENCY, FRACTURES): VITD: 41.79 ng/mL (ref 30.00–100.00)

## 2022-02-20 LAB — BASIC METABOLIC PANEL
BUN: 10 mg/dL (ref 6–23)
CO2: 27 mEq/L (ref 19–32)
Calcium: 10.9 mg/dL — ABNORMAL HIGH (ref 8.4–10.5)
Chloride: 101 mEq/L (ref 96–112)
Creatinine, Ser: 0.63 mg/dL (ref 0.40–1.20)
GFR: 97.92 mL/min (ref >60.00)
Glucose, Bld: 94 mg/dL (ref 70–99)
Potassium: 4.4 mEq/L (ref 3.5–5.1)
Sodium: 138 mEq/L (ref 135–145)

## 2022-02-20 LAB — MICROALBUMIN / CREATININE URINE RATIO
Creatinine,U: 20.3 mg/dL
Microalb Creat Ratio: 3.5 mg/g (ref 0.0–30.0)
Microalb, Ur: <0.7 mg/dL (ref 0.0–1.9)

## 2022-02-20 LAB — SEDIMENTATION RATE: Sed Rate: 14 mm/hr (ref 0–30)

## 2022-02-20 MED ORDER — AMLODIPINE BESYLATE 5 MG PO TABS
5.0000 mg | ORAL_TABLET | Freq: Every day | ORAL | 1 refills | Status: DC
  Filled 2022-02-20: qty 90, 90d supply, fill #0
  Filled 2022-05-27: qty 90, 90d supply, fill #1

## 2022-02-20 NOTE — Assessment & Plan Note (Signed)
Stopping spironolactone given lack of effect.  Adding amlodipine 5 mg  .  Continue metoprolol ?

## 2022-02-20 NOTE — Patient Instructions (Addendum)
Stop spironolactone for now since it is not working  ? ?Adding  amlodipine 5 mg  daily  to metoprolol dose  ? ?Use tylenol for headaches.   ?You can add up to 3000 mg of acetominophen (tylenol) every day for one week safely  In divided doses ( ? ?After one week if still needed you should drop back to 2000 mg daily (500 mg every 6 hours  Or 1000 mg every 12 hours.)  ? ?Checking calcium and PTH today  ? ? ?

## 2022-02-20 NOTE — Assessment & Plan Note (Signed)
Repeat level with PTH  ?

## 2022-02-20 NOTE — Assessment & Plan Note (Signed)
Checking ESR to r/o vasculitis .  MRI/MRA if positive ?

## 2022-02-20 NOTE — Assessment & Plan Note (Signed)
Occurring frequently for < 1 minute intervals  Resolving spontaneously  ?

## 2022-02-20 NOTE — Progress Notes (Addendum)
? ?Subjective:  ?Patient ID: Carla Mckinney, female    DOB: 06/06/1961  Age: 61 y.o. MRN: [id number] ? ?CC: The primary encounter diagnosis was Hypercalcemia. Diagnoses of Paroxysmal supraventricular tachycardia ([place]), Essential hypertension, Vitamin D deficiency, New daily persistent headache, and Primary hyperparathyroidism ([place]) were also pertinent to this visit. ? ? ?This visit occurred during the SARS-CoV-2 public health emergency.  Safety protocols were in place, including screening questions prior to the visit, additional usage of staff PPE, and extensive cleaning of exam room while observing appropriate contact time as indicated for disinfecting solutions.   ? ?HPI ?Carla Mckinney presents for follow up on hypertension with medication change 3 weeks ago.  ?Chief Complaint  ?Patient presents with  ? Follow-up  ?  3 week follow up   ? Hypertension  ? ?This visit occurred during the SARS-CoV-2 public health emergency.  Safety protocols were in place, including screening questions prior to the visit, additional usage of staff PPE, and extensive cleaning of exam room while observing appropriate contact time as indicated for disinfecting solutions.  ? ? ?Saw [place] 3 weeks ago  for BP  high 180/ spironolactone added.  Taking 25 mg daily no change in BP.  ? ?Has been having daily headaches for the past 3-4 weeks   usually frontal,  bilateral ,  without temple tenderness or vision changes. Marland Kitchen  Has been Taking ibuprofen 400 mg once or twice daily which transiently and only partially relieves her headaches . Having one now that is severe. BP is always elevated with headaches  ? ?Hypercalcemia:  asymptomatic,  has happened in the past .  Discussed getting  PTH checked if it remains elevated  ? ? ?Outpatient Medications Prior to Visit  ?Medication Sig Dispense Refill  ? aspirin 81 MG tablet Take 81 mg by mouth daily.    ? diltiazem (CARDIZEM) 30 MG tablet Take 1 tablet (30 mg total) by mouth as needed (rapid heart  rate). 90 tablet 0  ? ergocalciferol (DRISDOL) 1.25 MG (50000 UT) capsule Take 1 capsule (50,000 Units total) by mouth once a week. 12 capsule 0  ? furosemide (LASIX) 20 MG tablet Take 1 tablet (20 mg total) by mouth daily as needed. 90 tablet 1  ? ibuprofen (ADVIL,MOTRIN) 200 MG tablet Take 200 mg by mouth as needed.    ? metoprolol succinate (TOPROL-XL) 100 MG 24 hr tablet TAKE 1 TABLET BY MOUTH DAILY 90 tablet 0  ? spironolactone (ALDACTONE) 25 MG tablet Take 1 tablet (25 mg total) by mouth daily. 30 tablet 0  ? ?No facility-administered medications prior to visit.  ? ? ?Review of Systems; ? ?Patient denies headache, fevers, malaise, unintentional weight loss, skin rash, eye pain, sinus congestion and sinus pain, sore throat, dysphagia,  hemoptysis , cough, dyspnea, wheezing, chest pain, palpitations, orthopnea, edema, abdominal pain, nausea, melena, diarrhea, constipation, flank pain, dysuria, hematuria, urinary  Frequency, nocturia, numbness, tingling, seizures,  Focal weakness, Loss of consciousness,  Tremor, insomnia, depression, anxiety, and suicidal ideation.   ? ? ? ?Objective:  ?BP (!) 128/92 (BP Location: Left Arm, Patient Position: Sitting, Cuff Size: Large)   Pulse 85   Temp 98.3 ?F (36.8 ?C) (Oral)   Ht _0  (1.6 m)   Wt 187 lb (84.8 kg)   SpO2 96%   BMI 33.13 kg/m?  ? ?BP Readings from Last 3 Encounters:  ?02/20/22 (!) 128/92  ?01/28/22 130/86  ?04/28/21 110/72  ? ? ?Wt Readings from Last 3 Encounters:  ?02/20/22 187  lb (84.8 kg)  ?01/28/22 188 lb 3.2 oz (85.4 kg)  ?04/28/21 187 lb 12.8 oz (85.2 kg)  ? ? ?General appearance: alert, cooperative and appears stated age ?Ears: normal TM's and external ear canals both ears ?Throat: lips, mucosa, and tongue normal; teeth and gums normal ?Neck: no adenopathy, no carotid bruit, supple, symmetrical, trachea midline and thyroid not enlarged, symmetric, no tenderness/mass/nodules ?Back: symmetric, no curvature. ROM normal. No CVA tenderness. ?Lungs:  clear to auscultation bilaterally ?Heart: regular rate and rhythm, S1, S2 normal, no murmur, click, rub or gallop ?Abdomen: soft, non-tender; bowel sounds normal; no masses,  no organomegaly ?Pulses: 2+ and symmetric ?Skin: Skin color, texture, turgor normal. No rashes or lesions ?Lymph nodes: Cervical, supraclavicular, and axillary nodes normal. ? ?Lab Results  ?Component Value Date  ? HGBA1C 5.3 03/02/2018  ? HGBA1C 5.3 12/24/2016  ? ? ?Lab Results  ?Component Value Date  ? CREATININE 0.63 02/20/2022  ? CREATININE 0.66 02/12/2022  ? CREATININE 0.65 04/28/2021  ? ? ?Lab Results  ?Component Value Date  ? WBC 5.6 02/12/2022  ? HGB 13.2 02/12/2022  ? HCT 39.6 02/12/2022  ? PLT 266.0 02/12/2022  ? GLUCOSE 94 02/20/2022  ? CHOL 254 (H) 02/12/2022  ? TRIG 81.0 02/12/2022  ? HDL 65.30 02/12/2022  ? LDLDIRECT 154.1 02/23/2013  ? LDLCALC 172 (H) 02/12/2022  ? ALT 20 02/12/2022  ? AST 17 02/12/2022  ? NA 138 02/20/2022  ? K 4.4 02/20/2022  ? CL 101 02/20/2022  ? CREATININE 0.63 02/20/2022  ? BUN 10 02/20/2022  ? CO2 27 02/20/2022  ? TSH 2.78 04/28/2021  ? HGBA1C 5.3 03/02/2018  ? MICROALBUR <0.7 02/20/2022  ? ? ?MM 3D SCREEN BREAST BILATERAL ? ?Result Date: 02/11/2022 ?CLINICAL DATA:  Screening. EXAM: DIGITAL SCREENING BILATERAL MAMMOGRAM WITH TOMOSYNTHESIS AND CAD TECHNIQUE: Bilateral screening digital craniocaudal and mediolateral oblique mammograms were obtained. Bilateral screening digital breast tomosynthesis was performed. The images were evaluated with computer-aided detection. COMPARISON:  Previous exam(s). ACR Breast Density Category b: There are scattered areas of fibroglandular density. FINDINGS: There are no findings suspicious for malignancy. IMPRESSION: No mammographic evidence of malignancy. A result letter of this screening mammogram will be mailed directly to the patient. RECOMMENDATION: Screening mammogram in one year. (Code:SM-B-01Y) BI-RADS CATEGORY  1: Negative. Electronically Signed   By: Ammie Ferrier M.D.   On: 02/11/2022 09:56 ? ? ?Assessment & Plan:  ? ?Problem List Items Addressed This Visit   ? ? Essential hypertension  ?  Stopping spironolactone given lack of effect.  Adding amlodipine 5 mg  .  Continue metoprolol ?  ?  ? Relevant Medications  ? amLODipine (NORVASC) 5 MG tablet  ? Other Relevant Orders  ? Basic metabolic panel (Completed)  ? Urine Microalbumin w/creat. ratio (Completed)  ? Hypercalcemia - Primary  ?  Repeat level with PTH  ?  ?  ? Relevant Orders  ? PTH, Intact and Calcium (Completed)  ? DG Bone Density  ? New daily persistent headache  ?  Checking ESR to r/o vasculitis .  MRI/MRA if positive ?  ?  ? Relevant Medications  ? amLODipine (NORVASC) 5 MG tablet  ? Other Relevant Orders  ? Sedimentation rate (Completed)  ? Paroxysmal supraventricular tachycardia ([place])  ?  Occurring frequently for < 1 minute intervals  Resolving spontaneously  ?  ?  ? Relevant Medications  ? amLODipine (NORVASC) 5 MG tablet  ? Primary hyperparathyroidism ([place])  ?  Suggested by mild hypercalcemia and normal PTH  level, DEXA scan and endocrinology referral recommended.  ?  ?  ? Relevant Orders  ? DG Bone Density  ? ?Other Visit Diagnoses   ? ? Vitamin D deficiency      ? Relevant Orders  ? VITAMIN D 25 Hydroxy (Vit-D Deficiency, Fractures) (Completed)  ? ?  ? ? ?I spent 30 minutes dedicated to the care of this patient on the date of this encounter to include pre-visit review of patient's medical history,  most recent imaging studies, Face-to-face time with the patient , and post visit ordering of testing and therapeutics.   ? ?Follow-up: No follow-ups on file. ? ? ?Crecencio Mc, MD ?

## 2022-02-23 ENCOUNTER — Encounter: Payer: Self-pay | Admitting: Internal Medicine

## 2022-02-23 DIAGNOSIS — Z13820 Encounter for screening for osteoporosis: Secondary | ICD-10-CM

## 2022-02-23 DIAGNOSIS — E210 Primary hyperparathyroidism: Secondary | ICD-10-CM

## 2022-02-23 LAB — PTH, INTACT AND CALCIUM
Calcium: 10.8 mg/dL — ABNORMAL HIGH (ref 8.6–10.4)
PTH: 51 pg/mL (ref 16–77)

## 2022-02-23 NOTE — Assessment & Plan Note (Signed)
Suggested by mild hypercalcemia and normal PTH level, DEXA scan and endocrinology referral recommended.  ?

## 2022-02-23 NOTE — Addendum Note (Signed)
Addended by: Crecencio Mc on: 02/23/2022 12:58 PM ? ? Modules accepted: Orders ? ?

## 2022-02-24 NOTE — Telephone Encounter (Signed)
I have pended both the referral to Endocrinology and the order for the dexa scan.  ?

## 2022-03-09 ENCOUNTER — Ambulatory Visit: Payer: 59 | Admitting: Internal Medicine

## 2022-03-09 ENCOUNTER — Other Ambulatory Visit: Payer: Self-pay

## 2022-03-09 ENCOUNTER — Encounter: Payer: Self-pay | Admitting: Internal Medicine

## 2022-03-09 DIAGNOSIS — E210 Primary hyperparathyroidism: Secondary | ICD-10-CM | POA: Diagnosis not present

## 2022-03-09 DIAGNOSIS — I10 Essential (primary) hypertension: Secondary | ICD-10-CM

## 2022-03-09 DIAGNOSIS — G4452 New daily persistent headache (NDPH): Secondary | ICD-10-CM

## 2022-03-09 DIAGNOSIS — I471 Supraventricular tachycardia: Secondary | ICD-10-CM | POA: Diagnosis not present

## 2022-03-09 NOTE — Assessment & Plan Note (Signed)
ESR was normal .  Headaches have resolved with improvement in hypertension management ? ?Lab Results  ?Component Value Date  ? ESRSEDRATE 14 02/20/2022  ? ? ?

## 2022-03-09 NOTE — Progress Notes (Signed)
? ?Subjective:  ?Patient ID: Carla Mckinney, female    DOB: 18-Jun-1961  Age: 61 y.o. MRN: [id number] ? ?CC: Diagnoses of Primary hyperparathyroidism ([place]), Essential hypertension, Paroxysmal supraventricular tachycardia ([place]), and New daily persistent headache were pertinent to this visit. ? ? ?This visit occurred during the SARS-CoV-2 public health emergency.  Safety protocols were in place, including screening questions prior to the visit, additional usage of staff PPE, and extensive cleaning of exam room while observing appropriate contact time as indicated for disinfecting solutions.   ? ?HPI ?Carla Mckinney presents for FOLLOW UP ON HYPERTENSION, headaches and hypercalcemia   ?Chief Complaint  ?Patient presents with  ? Follow-up  ?  2 week follow up on blood pressure. Pt was started on amlodipine 5 mg 2 weeks ago. BP is much better.   ? ? ?1) htn:  Patient is taking her  amlodipine 5 mg (added 2 weeks ago ) and metoprolol succinate 100 mg daily as prescribed and notes no adverse effects.  Home BP readings have been done about once per week and are  generally < 130/80 .  She is avoiding added salt in her diet and walking regularly about 3 times per week for exercise  .  ? ?2) Chronic daily headaches:  have resolved since starting amlodipine ? ?3) Hyperparathyroidism: she has no history of nephrolithiasis ,  osteoporosis or psychiatric illness.  Endocrinology and DEXA scan ordered . Has not heard about the referral yet ? ? ? ?Outpatient Medications Prior to Visit  ?Medication Sig Dispense Refill  ? amLODipine (NORVASC) 5 MG tablet Take 1 tablet (5 mg total) by mouth daily. 90 tablet 1  ? aspirin 81 MG tablet Take 81 mg by mouth daily.    ? diltiazem (CARDIZEM) 30 MG tablet Take 1 tablet (30 mg total) by mouth as needed (rapid heart rate). 90 tablet 0  ? ergocalciferol (DRISDOL) 1.25 MG (50000 UT) capsule Take 1 capsule (50,000 Units total) by mouth once a week. 12 capsule 0  ? furosemide (LASIX) 20 MG tablet  Take 1 tablet (20 mg total) by mouth daily as needed. 90 tablet 1  ? ibuprofen (ADVIL,MOTRIN) 200 MG tablet Take 200 mg by mouth as needed.    ? metoprolol succinate (TOPROL-XL) 100 MG 24 hr tablet TAKE 1 TABLET BY MOUTH DAILY 90 tablet 0  ? ?No facility-administered medications prior to visit.  ? ? ?Review of Systems; ? ?Patient denies headache, fevers, malaise, unintentional weight loss, skin rash, eye pain, sinus congestion and sinus pain, sore throat, dysphagia,  hemoptysis , cough, dyspnea, wheezing, chest pain, palpitations, orthopnea, edema, abdominal pain, nausea, melena, diarrhea, constipation, flank pain, dysuria, hematuria, urinary  Frequency, nocturia, numbness, tingling, seizures,  Focal weakness, Loss of consciousness,  Tremor, insomnia, depression, anxiety, and suicidal ideation.   ? ? ? ?Objective:  ?BP 118/82 (BP Location: Left Arm, Patient Position: Sitting, Cuff Size: Large)   Pulse (!) 59   Temp 98.1 ?F (36.7 ?C) (Oral)   Ht _0  (1.6 m)   Wt 189 lb 12.8 oz (86.1 kg)   SpO2 98%   BMI 33.62 kg/m?  ? ?BP Readings from Last 3 Encounters:  ?03/09/22 118/82  ?02/20/22 (!) 128/92  ?01/28/22 130/86  ? ? ?Wt Readings from Last 3 Encounters:  ?03/09/22 189 lb 12.8 oz (86.1 kg)  ?02/20/22 187 lb (84.8 kg)  ?01/28/22 188 lb 3.2 oz (85.4 kg)  ? ? ?General appearance: alert, cooperative and appears stated age ?Ears: normal TM's and external  ear canals both ears ?Throat: lips, mucosa, and tongue normal; teeth and gums normal ?Neck: no adenopathy, no carotid bruit, supple, symmetrical, trachea midline and thyroid not enlarged, symmetric, no tenderness/mass/nodules ?Back: symmetric, no curvature. ROM normal. No CVA tenderness. ?Lungs: clear to auscultation bilaterally ?Heart: regular rate and rhythm, S1, S2 normal, no murmur, click, rub or gallop ?Abdomen: soft, non-tender; bowel sounds normal; no masses,  no organomegaly ?Pulses: 2+ and symmetric ?Skin: Skin color, texture, turgor normal. No rashes or  lesions ?Lymph nodes: Cervical, supraclavicular, and axillary nodes normal. ? ?Lab Results  ?Component Value Date  ? HGBA1C 5.3 03/02/2018  ? HGBA1C 5.3 12/24/2016  ? ? ?Lab Results  ?Component Value Date  ? CREATININE 0.63 02/20/2022  ? CREATININE 0.66 02/12/2022  ? CREATININE 0.65 04/28/2021  ? ? ?Lab Results  ?Component Value Date  ? WBC 5.6 02/12/2022  ? HGB 13.2 02/12/2022  ? HCT 39.6 02/12/2022  ? PLT 266.0 02/12/2022  ? GLUCOSE 94 02/20/2022  ? CHOL 254 (H) 02/12/2022  ? TRIG 81.0 02/12/2022  ? HDL 65.30 02/12/2022  ? LDLDIRECT 154.1 02/23/2013  ? LDLCALC 172 (H) 02/12/2022  ? ALT 20 02/12/2022  ? AST 17 02/12/2022  ? NA 138 02/20/2022  ? K 4.4 02/20/2022  ? CL 101 02/20/2022  ? CREATININE 0.63 02/20/2022  ? BUN 10 02/20/2022  ? CO2 27 02/20/2022  ? TSH 2.78 04/28/2021  ? HGBA1C 5.3 03/02/2018  ? MICROALBUR <0.7 02/20/2022  ? ? ?MM 3D SCREEN BREAST BILATERAL ? ?Result Date: 02/11/2022 ?CLINICAL DATA:  Screening. EXAM: DIGITAL SCREENING BILATERAL MAMMOGRAM WITH TOMOSYNTHESIS AND CAD TECHNIQUE: Bilateral screening digital craniocaudal and mediolateral oblique mammograms were obtained. Bilateral screening digital breast tomosynthesis was performed. The images were evaluated with computer-aided detection. COMPARISON:  Previous exam(s). ACR Breast Density Category b: There are scattered areas of fibroglandular density. FINDINGS: There are no findings suspicious for malignancy. IMPRESSION: No mammographic evidence of malignancy. A result letter of this screening mammogram will be mailed directly to the patient. RECOMMENDATION: Screening mammogram in one year. (Code:SM-B-01Y) BI-RADS CATEGORY  1: Negative. Electronically Signed   By: Ammie Ferrier M.D.   On: 02/11/2022 09:56 ? ? ?Assessment & Plan:  ? ?Problem List Items Addressed This Visit   ? ? Essential hypertension  ?  Improved control with addition of 5 mg amlodipine.  ACE/ARB are c/i due to history of angioedema with ACE inhibitor.  ?  ?  ? New daily  persistent headache  ?   ESR was normal .  Headaches have resolved with improvement in hypertension management ? ?Lab Results  ?Component Value Date  ? ESRSEDRATE 14 02/20/2022  ? ? ?  ?  ? Paroxysmal supraventricular tachycardia ([place])  ?  Managed with metoprolol.  Prn use of diltiazem ; has only needed it once in the past month  ?  ?  ? Primary hyperparathyroidism ([place])  ?  Suggested by mild hypercalcemia and normal PTH level, DEXA scan and endocrinology referral have been ordered  ? ?Lab Results  ?Component Value Date  ? PTH 51 02/20/2022  ? CALCIUM 10.8 (H) 02/20/2022  ? CALCIUM 10.9 (H) 02/20/2022  ? ?  ?  ? ? ?I spent 30 minutes dedicated to the care of this patient on the date of this encounter to include pre-visit review of patient's medical history,  most recent imaging studies, Face-to-face time with the patient , and post visit ordering of testing and therapeutics.   ? ?Follow-up: No follow-ups on file. ? ? ?  Crecencio Mc, MD ?

## 2022-03-09 NOTE — Assessment & Plan Note (Signed)
Suggested by mild hypercalcemia and normal PTH level, DEXA scan and endocrinology referral have been ordered  ? ?Lab Results  ?Component Value Date  ? PTH 51 02/20/2022  ? CALCIUM 10.8 (H) 02/20/2022  ? CALCIUM 10.9 (H) 02/20/2022  ? ?

## 2022-03-09 NOTE — Assessment & Plan Note (Signed)
Managed with metoprolol.  Prn use of diltiazem ; has only needed it once in the past month  ?

## 2022-03-09 NOTE — Assessment & Plan Note (Signed)
Improved control with addition of 5 mg amlodipine.  ACE/ARB are c/i due to history of angioedema with ACE inhibitor.  ?

## 2022-04-02 ENCOUNTER — Other Ambulatory Visit: Payer: Self-pay | Admitting: Internal Medicine

## 2022-04-02 DIAGNOSIS — I10 Essential (primary) hypertension: Secondary | ICD-10-CM

## 2022-04-03 ENCOUNTER — Other Ambulatory Visit: Payer: Self-pay

## 2022-04-03 MED ORDER — METOPROLOL SUCCINATE ER 100 MG PO TB24
ORAL_TABLET | Freq: Every day | ORAL | 0 refills | Status: DC
  Filled 2022-04-03: qty 90, 90d supply, fill #0

## 2022-04-20 ENCOUNTER — Other Ambulatory Visit: Payer: Self-pay

## 2022-04-21 ENCOUNTER — Encounter: Payer: Self-pay | Admitting: Internal Medicine

## 2022-04-29 ENCOUNTER — Ambulatory Visit
Admission: RE | Admit: 2022-04-29 | Discharge: 2022-04-29 | Disposition: A | Discharge status: Home / Self Care | Payer: 59 | Source: Ambulatory Visit | Attending: Internal Medicine | Admitting: Internal Medicine

## 2022-04-29 DIAGNOSIS — M85852 Other specified disorders of bone density and structure, left thigh: Secondary | ICD-10-CM | POA: Diagnosis not present

## 2022-04-29 DIAGNOSIS — E210 Primary hyperparathyroidism: Secondary | ICD-10-CM | POA: Diagnosis not present

## 2022-05-04 ENCOUNTER — Ambulatory Visit (INDEPENDENT_AMBULATORY_CARE_PROVIDER_SITE_OTHER): Payer: 59 | Admitting: Internal Medicine

## 2022-05-04 ENCOUNTER — Other Ambulatory Visit: Payer: Self-pay

## 2022-05-04 ENCOUNTER — Encounter: Payer: Self-pay | Admitting: Internal Medicine

## 2022-05-04 VITALS — BP 134/88 | HR 68 | Temp 98.0°F | Ht 63.0 in | Wt 188.8 lb

## 2022-05-04 DIAGNOSIS — Z6830 Body mass index (BMI) 30.0-30.9, adult: Secondary | ICD-10-CM | POA: Diagnosis not present

## 2022-05-04 DIAGNOSIS — I10 Essential (primary) hypertension: Secondary | ICD-10-CM | POA: Diagnosis not present

## 2022-05-04 DIAGNOSIS — R7301 Impaired fasting glucose: Secondary | ICD-10-CM | POA: Diagnosis not present

## 2022-05-04 DIAGNOSIS — Z0000 Encounter for general adult medical examination without abnormal findings: Secondary | ICD-10-CM | POA: Diagnosis not present

## 2022-05-04 DIAGNOSIS — E6609 Other obesity due to excess calories: Secondary | ICD-10-CM

## 2022-05-04 DIAGNOSIS — E210 Primary hyperparathyroidism: Secondary | ICD-10-CM

## 2022-05-04 LAB — POCT GLYCOSYLATED HEMOGLOBIN (HGB A1C): Hemoglobin A1C: 5.3 % (ref 4.0–5.6)

## 2022-05-04 LAB — GLUCOSE, POCT (MANUAL RESULT ENTRY): POC Glucose: 112 mg/dl — AB (ref 70–99)

## 2022-05-04 MED ORDER — OZEMPIC (0.25 OR 0.5 MG/DOSE) 2 MG/3ML ~~LOC~~ SOPN
0.2500 mg | PEN_INJECTOR | SUBCUTANEOUS | 1 refills | Status: DC
Start: 2022-05-04 — End: 2022-05-05
  Filled 2022-05-04: qty 3, 28d supply, fill #0

## 2022-05-04 NOTE — Assessment & Plan Note (Signed)
Improved control with addition of 5 mg amlodipine.  ACE/ARB are c/i due to history of angioedema with ACE inhibitor.  Metoprolol cannot be stopped due to history of SVT .  encouraged her to lose weight which has been shown to reduce BP  ?

## 2022-05-04 NOTE — Assessment & Plan Note (Signed)

## 2022-05-04 NOTE — Progress Notes (Signed)
The patient is here for annual preventive examination and management of other chronic and acute problems. ?  ?The risk factors are reflected in the social history. ?  ?The roster of all physicians providing medical care to patient - is listed in the Snapshot section of the chart. ?  ?Activities of daily living:  The patient is 100% independent in all ADLs: dressing, toileting, feeding as well as independent mobility ?  ?Home safety : The patient has smoke detectors in the home. They wear seatbelts.  There are no unsecured firearms at home. There is no violence in the home.  ?  ?There is no risks for hepatitis, STDs or HIV. There is no   history of blood transfusion. They have no travel history to infectious disease endemic areas of the world. ?  ?The patient has seen their dentist in the last six month. They have seen their eye doctor in the last year. The patinet  denies slight hearing difficulty with regard to whispered voices and some television programs.  They have deferred audiologic testing in the last year.  They do not  have excessive sun exposure. Discussed the need for sun protection: hats, long sleeves and use of sunscreen if there is significant sun exposure.  ?  ?Diet: the importance of a healthy diet is discussed. They do have a healthy diet. ?  ?The benefits of regular aerobic exercise were discussed. The patient  exercises  3 to 5 days per week  for  60 minutes.  ?  ?Depression screen: there are no signs or vegative symptoms of depression- irritability, change in appetite, anhedonia, sadness/tearfullness. ?  ?The following portions of the patient's history were reviewed and updated as appropriate: allergies, current medications, past family history, past medical history,  past surgical history, past social history  and problem list. ?  ?Visual acuity was not assessed per patient preference since the patient has regular follow up with an  ophthalmologist. Hearing and body mass index were assessed and  reviewed.  ?  ?During the course of the visit the patient was educated and counseled about appropriate screening and preventive services including : fall prevention , diabetes screening, nutrition counseling, colorectal cancer screening, and recommended immunizations.   ? ?Chief Complaint: ? ?1) Hypercalcemia :  recent DEXA reviewed, done because of hypercalcemia and normal PTH   Vitamin D level is normal.  T score -1.8  ? ? ?2) HTN follow up : Patient is taking her medications as prescribed and notes no adverse effects.  Home BP readings have been done about once per week and are  generally < 130/80 .  She is avoiding added salt in her diet and walking regularly about 3 times per week for exercise  .  ?  ?3) Obesity:  she has a healthy diet but is eating late at night and not exercising regularly.  She is concerned about her weight and requesting appetite suppression  ? ?Review of Symptoms ? ?Patient denies headache, fevers, malaise, unintentional weight loss, skin rash, eye pain, sinus congestion and sinus pain, sore throat, dysphagia,  hemoptysis , cough, dyspnea, wheezing, chest pain, palpitations, orthopnea, edema, abdominal pain, nausea, melena, diarrhea, constipation, flank pain, dysuria, hematuria, urinary  Frequency, nocturia, numbness, tingling, seizures,  Focal weakness, Loss of consciousness,  Tremor, insomnia, depression, anxiety, and suicidal ideation.   ? ?Physical Exam: ? ?BP 134/88 (BP Location: Left Arm, Patient Position: Sitting, Cuff Size: Normal)   Pulse 68   Temp 98 ?F (36.7 ?C) (Oral)  Ht '5\' 3"'$  (1.6 m)   Wt 188 lb 12.8 oz (85.6 kg)   SpO2 97%   BMI 33.44 kg/m?   ? ?General appearance: alert, cooperative and appears stated age ?Ears: normal TM's and external ear canals both ears ?Throat: lips, mucosa, and tongue normal; teeth and gums normal ?Neck: no adenopathy, no carotid bruit, supple, symmetrical, trachea midline and thyroid not enlarged, symmetric, no tenderness/mass/nodules ?Back:  symmetric, no curvature. ROM normal. No CVA tenderness. ?Lungs: clear to auscultation bilaterally ?Heart: regular rate and rhythm, S1, S2 normal, no murmur, click, rub or gallop ?Abdomen: soft, non-tender; bowel sounds normal; no masses,  no organomegaly ?Pulses: 2+ and symmetric ?Skin: Skin color, texture, turgor normal. No rashes or lesions ?Lymph nodes: Cervical, supraclavicular, and axillary nodes normal.  ? ?Assessment and Plan: ? ?Impaired fasting glucose ?Home tests have been elevated. Checking  a1c today  ? ?Obesity ? She has trouble suppressing appetite and is often eating late at hight .  She has requested help with appetite suppression and is interesting in starting a GLP 1 agonist. .  Patient has no contraindications to using this medication.  The risks  and benefits were reviewed, and instructions on titration of dose were outlined.  .  ? ?Encounter for preventive health examination ?age appropriate education and counseling updated, referrals for preventative services and immunizations addressed, dietary and smoking counseling addressed, most recent labs reviewed.  I have personally reviewed and have noted: ?  ?1) the patient's medical and social history ?2) The pt's use of alcohol, tobacco, and illicit drugs ?3) The patient's current medications and supplements ?4) Functional ability including ADL's, fall risk, home safety risk, hearing and visual impairment ?5) Diet and physical activities ?6) Evidence for depression or mood disorder ?7) The patient's height, weight, and BMI have been recorded in the chart ?  ?I have made referrals, and provided counseling and education based on review of the above ? ?Essential hypertension ?Improved control with addition of 5 mg amlodipine.  ACE/ARB are c/i due to history of angioedema with ACE inhibitor.  Metoprolol cannot be stopped due to history of SVT .  encouraged her to lose weight which has been shown to reduce BP  ? ?Primary hyperparathyroidism ([place]) ?Her  appt with endocrinoology is not until November. DEXA has been done  She has mild osteopenia T score -1.8.  Will repeat levels every 3 months until she is seen by Endocrine ? ?Hypercalcemia ?Mild, secondary to primary PTH.  Repeat level needed in June .   ? ? ?Updated Medication List ?Outpatient Encounter Medications as of 05/04/2022  ?Medication Sig  ? amLODipine (NORVASC) 5 MG tablet Take 1 tablet (5 mg total) by mouth daily.  ? aspirin 81 MG tablet Take 81 mg by mouth daily.  ? cholecalciferol (VITAMIN D3) 25 MCG (1000 UNIT) tablet Take 1,000 Units by mouth daily.  ? diltiazem (CARDIZEM) 30 MG tablet Take 1 tablet (30 mg total) by mouth as needed (rapid heart rate).  ? furosemide (LASIX) 20 MG tablet Take 1 tablet (20 mg total) by mouth daily as needed.  ? ibuprofen (ADVIL,MOTRIN) 200 MG tablet Take 200 mg by mouth as needed.  ? metoprolol succinate (TOPROL-XL) 100 MG 24 hr tablet TAKE 1 TABLET BY MOUTH DAILY  ? Semaglutide,0.25 or 0.'5MG'$ /DOS, (OZEMPIC, 0.25 OR 0.5 MG/DOSE,) 2 MG/3ML SOPN Inject 0.25 mg into the skin once a week.  ? [DISCONTINUED] ergocalciferol (DRISDOL) 1.25 MG (50000 UT) capsule Take 1 capsule (50,000 Units total) by mouth once  a week. (Patient not taking: Reported on 05/04/2022)  ? ?No facility-administered encounter medications on file as of 05/04/2022.  ? ? ?

## 2022-05-04 NOTE — Assessment & Plan Note (Addendum)
Her appt with endocrinoology is not until November. DEXA has been done  She has mild osteopenia T score -1.8.  Will repeat levels every 3 months until she is seen by Endocrine ?

## 2022-05-04 NOTE — Assessment & Plan Note (Signed)
Home tests have been elevated. Checking  a1c today  ?

## 2022-05-04 NOTE — Assessment & Plan Note (Signed)
She has trouble suppressing appetite and is often eating late at hight .  She has requested help with appetite suppression and is interesting in starting a GLP 1 agonist. .  Patient has no contraindications to using this medication.  The risks  and benefits were reviewed, and instructions on titration of dose were outlined.  Marland Kitchen  ?

## 2022-05-04 NOTE — Assessment & Plan Note (Signed)
Mild, secondary to primary PTH.  Repeat level needed in June .   ?

## 2022-05-04 NOTE — Patient Instructions (Addendum)
Your labs suggest that you have primary hyperparathyroidism.  This is typically followed by an endocrinologist with serial measurements of calcium levels,  and surgery may be recommended if the calcium level becomes too high.  ? ?We will repeat your levels in June and again prior to your November endocrinology evaluation  ? ? ?For your weight: ? ?I am recommending an injectable medication to help curb your appetite,  It is called Ozempic .  It slows down your stomach emptying so you stay full longer . once you pick up the medication,  Schedule  an RN visit so we can show you  To  inject it   ? ? ? ?For hubby: ? ?Tell him to use Sudafed PE and/or afrin for a few days to deal with the congestion  ? ?Can use Flonase spray for entire season (but afrin  of 5 days max)  ? ? ?

## 2022-05-05 MED ORDER — SEMAGLUTIDE-WEIGHT MANAGEMENT 0.25 MG/0.5ML ~~LOC~~ SOAJ
0.2500 mg | SUBCUTANEOUS | 2 refills | Status: DC
  Filled 2022-05-05 – 2022-06-24 (×3): qty 2, 28d supply, fill #0
  Filled 2022-07-13 – 2022-07-15 (×2): qty 2, 28d supply, fill #1

## 2022-05-05 NOTE — Telephone Encounter (Signed)
Pt called in stating that Dr. Derrel Nip prescribe her medication (Semaglutide,0.25 or 0.'5MG'$ /DOS, (OZEMPIC, 0.25 OR 0.5 MG/DOSE,) 2 MG/3ML SOPN)... Pt stated that her insurance will not cover medication... Pt is requesting for a cheaper medication... Pt stated that her insurance advised her about medication [facility])... Pt stated that insurance will cover [facility]).... Pt requesting callback  ?

## 2022-05-06 ENCOUNTER — Other Ambulatory Visit: Payer: Self-pay

## 2022-05-07 ENCOUNTER — Telehealth: Payer: Self-pay

## 2022-05-07 NOTE — Telephone Encounter (Signed)
PA for [employer] has been submitted on covermymeds.  °

## 2022-05-08 ENCOUNTER — Other Ambulatory Visit: Payer: Self-pay

## 2022-05-11 NOTE — Telephone Encounter (Signed)
PA has been approved and pt is aware.

## 2022-05-19 ENCOUNTER — Ambulatory Visit
Admission: RE | Admit: 2022-05-19 | Discharge: 2022-05-19 | Disposition: A | Discharge status: Home / Self Care | Payer: 59 | Source: Ambulatory Visit | Attending: Emergency Medicine | Admitting: Emergency Medicine

## 2022-05-19 ENCOUNTER — Other Ambulatory Visit: Payer: Self-pay

## 2022-05-19 VITALS — BP 150/88 | HR 73 | Temp 97.7°F | Resp 18

## 2022-05-19 DIAGNOSIS — I10 Essential (primary) hypertension: Secondary | ICD-10-CM

## 2022-05-19 DIAGNOSIS — R051 Acute cough: Secondary | ICD-10-CM | POA: Diagnosis not present

## 2022-05-19 DIAGNOSIS — J0100 Acute maxillary sinusitis, unspecified: Secondary | ICD-10-CM | POA: Diagnosis not present

## 2022-05-19 MED ORDER — AMOXICILLIN 875 MG PO TABS
875.0000 mg | ORAL_TABLET | Freq: Two times a day (BID) | ORAL | 0 refills | Status: AC
  Filled 2022-05-19: qty 20, 10d supply, fill #0

## 2022-05-19 MED ORDER — BENZONATATE 100 MG PO CAPS
100.0000 mg | ORAL_CAPSULE | Freq: Three times a day (TID) | ORAL | 0 refills | Status: DC | PRN
  Filled 2022-05-19: qty 21, 7d supply, fill #0

## 2022-05-19 NOTE — Discharge Instructions (Addendum)
Take the amoxicillin and Tessalon Perles as directed.  Follow up with your primary care provider if your symptoms are not improving.    Your blood pressure is elevated today at 150/88.  Please have this rechecked by your primary care provider in 2-4 weeks.

## 2022-05-19 NOTE — ED Provider Notes (Signed)
Carla Mckinney    CSN: [id number] Arrival date & time: 05/19/22  9528      History   Chief Complaint Chief Complaint  Patient presents with   Cough    Been sick a week - Entered by patient    HPI Carla Mckinney is a 61 y.o. female.  Patient presents with 9 day history of congestion, runny nose, postnasal drip, and cough that is occasionally productive of green sputum.  She denies fever, rash, sore throat, shortness of breath, vomiting, diarrhea, or other symptoms.  Multiple OTC treatments attempted.  Her medical history includes hypertension.  The history is provided by the patient and medical records.   Past Medical History:  Diagnosis Date   Hypertension    Rheumatic fever 1986   has had echo since then. no cardiac issues.    Patient Active Problem List   Diagnosis Date Noted   Impaired fasting glucose 05/04/2022   Primary hyperparathyroidism ([place]) 02/23/2022   New daily persistent headache 02/20/2022   Hypercalcemia 02/20/2022   Skin neoplasm 04/30/2021   Otitis media with effusion, bilateral 12/18/2020   Paroxysmal supraventricular tachycardia ([place]) 06/28/2019   Pure hypercholesterolemia 06/28/2019   Snoring 11/22/2018   Special screening for malignant neoplasms, colon    Obesity 12/24/2016   Encounter for preventive health examination 05/19/2016   Essential hypertension 07/18/2014   Cyst of breast, left, benign solitary 03/23/2013   Hyperlipidemia with target LDL less than 100 02/24/2013    Past Surgical History:  Procedure Laterality Date   ABDOMINAL HYSTERECTOMY     AUGMENTATION MAMMAPLASTY Bilateral    breast lift   CARDIAC CATHETERIZATION  2002   normal, due to syncope and abnormal myowiew ([facility])   COLONOSCOPY WITH PROPOFOL N/A 09/15/2017   Procedure: COLONOSCOPY WITH PROPOFOL;  Surgeon: Lin Landsman, MD;  Location: [facility];  Service: Gastroenterology;  Laterality: N/A;   MASTOPEXY  2008   OOPHORECTOMY     secondary to  scar tissue,  [facility]   TONSILLECTOMY     TUBAL LIGATION      OB History     Gravida  2   Para  2   Term      Preterm      AB      Living  2      SAB      IAB      Ectopic      Multiple      Live Births           Obstetric Comments  1st Menstrual Cycle: 17 1st Pregnancy: 23          Home Medications    Prior to Admission medications   Medication Sig Start Date End Date Taking? Authorizing Provider  amoxicillin (AMOXIL) 875 MG tablet Take 1 tablet (875 mg total) by mouth 2 (two) times daily for 10 days. 05/19/22 05/29/22 Yes Sharion Balloon, NP  benzonatate (TESSALON) 100 MG capsule Take 1 capsule (100 mg total) by mouth 3 (three) times daily as needed for cough. 05/19/22  Yes Sharion Balloon, NP  amLODipine (NORVASC) 5 MG tablet Take 1 tablet (5 mg total) by mouth daily. 02/20/22   Crecencio Mc, MD  aspirin 81 MG tablet Take 81 mg by mouth daily.    [provider]  cholecalciferol (VITAMIN D3) 25 MCG (1000 UNIT) tablet Take 1,000 Units by mouth daily.    [provider]  diltiazem (CARDIZEM) 30 MG tablet Take 1 tablet (  30 mg total) by mouth as needed (rapid heart rate). 06/27/19   Crecencio Mc, MD  furosemide (LASIX) 20 MG tablet Take 1 tablet (20 mg total) by mouth daily as needed. 04/09/20   Crecencio Mc, MD  ibuprofen (ADVIL,MOTRIN) 200 MG tablet Take 200 mg by mouth as needed.    [provider]  metoprolol succinate (TOPROL-XL) 100 MG 24 hr tablet TAKE 1 TABLET BY MOUTH DAILY 04/03/22 04/03/23  Crecencio Mc, MD  Semaglutide-Weight Management 0.25 MG/0.5ML SOAJ Inject 0.25 mg into the skin once a week for 28 days. 05/05/22 06/02/22  Crecencio Mc, MD    Family History Family History  Problem Relation Age of Onset   Heart disease Mother    Heart failure Mother    Hypertension Father    Hyperlipidemia Father    Heart disease Maternal Grandfather    Heart disease Paternal Grandmother    Stroke Paternal Grandmother 50   Cancer  Paternal Grandfather        multiple myeloma   Breast cancer Neg Hx     Social History Social History   Tobacco Use   Smoking status: Never   Smokeless tobacco: Never  Vaping Use   Vaping Use: Never used  Substance Use Topics   Alcohol use: Yes    Alcohol/week: 1.0 standard drink    Types: 1 Glasses of wine per week    Comment: socially   Drug use: No     Allergies   Lisinopril   Review of Systems Review of Systems  Constitutional:  Negative for chills and fever.  HENT:  Positive for congestion, postnasal drip, rhinorrhea and sinus pressure. Negative for ear pain and sore throat.   Respiratory:  Positive for cough. Negative for shortness of breath.   Cardiovascular:  Negative for chest pain and palpitations.  Gastrointestinal:  Negative for diarrhea and vomiting.  Skin:  Negative for color change and rash.  All other systems reviewed and are negative.   Physical Exam Triage Vital Signs ED Triage Vitals  Enc Vitals Group     BP      Pulse      Resp      Temp      Temp src      SpO2      Weight      Height      Head Circumference      Peak Flow      Pain Score      Pain Loc      Pain Edu?      Excl. in [place]?    No data found.  Updated Vital Signs BP (!) 150/88 (BP Location: Left Arm)   Pulse 73   Temp 97.7 F (36.5 C) (Temporal)   Resp 18   SpO2 96%   Visual Acuity Right Eye Distance:   Left Eye Distance:   Bilateral Distance:    Right Eye Near:   Left Eye Near:    Bilateral Near:     Physical Exam Vitals and nursing note reviewed.  Constitutional:      General: She is not in acute distress.    Appearance: Normal appearance. She is well-developed. She is not ill-appearing.  HENT:     Right Ear: Tympanic membrane normal.     Left Ear: Tympanic membrane normal.     Nose: Congestion and rhinorrhea present.     Mouth/Throat:     Mouth: Mucous membranes are moist.     Pharynx:  Oropharynx is clear.  Cardiovascular:     Rate and Rhythm:  Normal rate and regular rhythm.     Heart sounds: Normal heart sounds.  Pulmonary:     Effort: Pulmonary effort is normal. No respiratory distress.     Breath sounds: Normal breath sounds.  Musculoskeletal:     Cervical back: Neck supple.  Skin:    General: Skin is warm and dry.  Neurological:     Mental Status: She is alert.  Psychiatric:        Mood and Affect: Mood normal.        Behavior: Behavior normal.     UC Treatments / Results  Labs (all labs ordered are listed, but only abnormal results are displayed) Labs Reviewed - No data to display  EKG   Radiology No results found.  Procedures Procedures (including critical care time)  Medications Ordered in UC Medications - No data to display  Initial Impression / Assessment and Plan / UC Course  I have reviewed the triage vital signs and the nursing notes.  Pertinent labs & imaging results that were available during my care of the patient were reviewed by me and considered in my medical decision making (see chart for details).   Acute sinusitis, cough, Elevated blood pressure with HTN.  Patient has been symptomatic for 9 days and is not improving despite multiple OTC treatments attempted.  Treating today with amoxicillin and Tessalon Perles.  Cautioned patient to avoid OTC medications that can elevated her blood pressure.  Discussed with patient that her blood pressure is elevated today and needs to be rechecked by PCP in 2 to 4 weeks.  Education provided on managing hypertension.  Patient agrees to plan of care.    Final Clinical Impressions(s) / UC Diagnoses   Final diagnoses:  Acute non-recurrent maxillary sinusitis  Acute cough  Elevated blood pressure reading in office with diagnosis of hypertension     Discharge Instructions      Take the amoxicillin and Tessalon Perles as directed.  Follow up with your primary care provider if your symptoms are not improving.    Your blood pressure is elevated today at  150/88.  Please have this rechecked by your primary care provider in 2-4 weeks.          ED Prescriptions     Medication Sig Dispense Auth. Provider   amoxicillin (AMOXIL) 875 MG tablet Take 1 tablet (875 mg total) by mouth 2 (two) times daily for 10 days. 20 tablet Sharion Balloon, NP   benzonatate (TESSALON) 100 MG capsule Take 1 capsule (100 mg total) by mouth 3 (three) times daily as needed for cough. 21 capsule Sharion Balloon, NP      PDMP not reviewed this encounter.   Sharion Balloon, NP 05/19/22 [phone]

## 2022-05-19 NOTE — ED Triage Notes (Signed)
Patient presents to Urgent Care with complaints of a cough x 1.5 weeks. Treating symptoms with dayquil, nyquil, mucinex, and theraflu.

## 2022-05-27 ENCOUNTER — Other Ambulatory Visit: Payer: Self-pay

## 2022-05-29 ENCOUNTER — Other Ambulatory Visit: Payer: Self-pay

## 2022-06-02 ENCOUNTER — Other Ambulatory Visit: Payer: Self-pay

## 2022-06-10 ENCOUNTER — Other Ambulatory Visit: Payer: Self-pay

## 2022-06-24 ENCOUNTER — Other Ambulatory Visit: Payer: Self-pay

## 2022-06-26 ENCOUNTER — Other Ambulatory Visit: Payer: Self-pay

## 2022-06-29 ENCOUNTER — Other Ambulatory Visit: Payer: Self-pay

## 2022-07-07 ENCOUNTER — Other Ambulatory Visit: Payer: Self-pay | Admitting: Internal Medicine

## 2022-07-07 DIAGNOSIS — I10 Essential (primary) hypertension: Secondary | ICD-10-CM

## 2022-07-08 ENCOUNTER — Other Ambulatory Visit: Payer: Self-pay

## 2022-07-08 MED ORDER — METOPROLOL SUCCINATE ER 100 MG PO TB24
ORAL_TABLET | Freq: Every day | ORAL | 1 refills | Status: DC
  Filled 2022-07-08: qty 73, 73d supply, fill #0
  Filled 2022-07-08: qty 90, fill #0
  Filled 2022-07-08: qty 17, 17d supply, fill #0
  Filled 2022-10-01: qty 90, 90d supply, fill #1

## 2022-07-13 ENCOUNTER — Other Ambulatory Visit: Payer: Self-pay

## 2022-07-16 ENCOUNTER — Other Ambulatory Visit: Payer: Self-pay

## 2022-07-20 ENCOUNTER — Encounter: Payer: Self-pay | Admitting: Internal Medicine

## 2022-07-21 ENCOUNTER — Other Ambulatory Visit: Payer: Self-pay

## 2022-07-21 MED ORDER — WEGOVY 0.5 MG/0.5ML ~~LOC~~ SOAJ
0.5000 mg | SUBCUTANEOUS | 1 refills | Status: DC
  Filled 2022-07-21: qty 2, 28d supply, fill #0
  Filled 2022-08-14: qty 2, 28d supply, fill #1

## 2022-08-14 ENCOUNTER — Other Ambulatory Visit: Payer: Self-pay

## 2022-08-20 ENCOUNTER — Other Ambulatory Visit: Payer: Self-pay | Admitting: Internal Medicine

## 2022-08-20 DIAGNOSIS — I10 Essential (primary) hypertension: Secondary | ICD-10-CM

## 2022-08-21 ENCOUNTER — Other Ambulatory Visit: Payer: Self-pay

## 2022-08-21 MED ORDER — AMLODIPINE BESYLATE 5 MG PO TABS
5.0000 mg | ORAL_TABLET | Freq: Every day | ORAL | 1 refills | Status: DC
  Filled 2022-08-21: qty 90, 90d supply, fill #0

## 2022-09-15 ENCOUNTER — Other Ambulatory Visit: Payer: Self-pay | Admitting: Internal Medicine

## 2022-09-15 ENCOUNTER — Other Ambulatory Visit: Payer: Self-pay

## 2022-09-15 MED ORDER — SEMAGLUTIDE-WEIGHT MANAGEMENT 1 MG/0.5ML ~~LOC~~ SOAJ
1.0000 mg | SUBCUTANEOUS | 2 refills | Status: DC
  Filled 2022-09-15: qty 2, 28d supply, fill #0
  Filled 2022-10-05: qty 2, 28d supply, fill #1
  Filled 2022-11-07: qty 2, 28d supply, fill #2

## 2022-09-15 NOTE — Telephone Encounter (Signed)
Pt stated that she has been on the current dose for almost two months and has not had any weight loss in the last couple of weeks. Pt would like to increase to the next dose. Pt is scheduled for a follow up on 09/29/2022.

## 2022-09-15 NOTE — Telephone Encounter (Signed)
LMTCB. Need to find out if pt is still losing weight on current dose, how long she has been on the current dose. Will also need to schedule pt for a follow up, when on weight loss medication you have to be seen every 3 months.

## 2022-09-15 NOTE — Telephone Encounter (Signed)
Wegovy increased to 1.0 mg weekly and sent to [facility]

## 2022-09-15 NOTE — Telephone Encounter (Signed)
Pt called back and I read the message to her but pt asked a question that I could not answer so I transferred the call to the cma

## 2022-09-29 ENCOUNTER — Other Ambulatory Visit: Payer: Self-pay

## 2022-09-29 ENCOUNTER — Encounter: Payer: Self-pay | Admitting: Internal Medicine

## 2022-09-29 ENCOUNTER — Ambulatory Visit: Payer: 59 | Admitting: Internal Medicine

## 2022-09-29 DIAGNOSIS — I471 Supraventricular tachycardia, unspecified: Secondary | ICD-10-CM | POA: Diagnosis not present

## 2022-09-29 DIAGNOSIS — E210 Primary hyperparathyroidism: Secondary | ICD-10-CM | POA: Diagnosis not present

## 2022-09-29 DIAGNOSIS — Z23 Encounter for immunization: Secondary | ICD-10-CM

## 2022-09-29 DIAGNOSIS — I10 Essential (primary) hypertension: Secondary | ICD-10-CM

## 2022-09-29 DIAGNOSIS — R7301 Impaired fasting glucose: Secondary | ICD-10-CM

## 2022-09-29 LAB — COMPREHENSIVE METABOLIC PANEL
ALT: 19 U/L (ref 0–35)
AST: 19 U/L (ref 0–37)
Albumin: 4.2 g/dL (ref 3.5–5.2)
Alkaline Phosphatase: 97 U/L (ref 39–117)
BUN: 7 mg/dL (ref 6–23)
CO2: 29 mEq/L (ref 19–32)
Calcium: 10.9 mg/dL — ABNORMAL HIGH (ref 8.4–10.5)
Chloride: 101 mEq/L (ref 96–112)
Creatinine, Ser: 0.64 mg/dL (ref 0.40–1.20)
GFR: 97.14 mL/min (ref >60.00)
Glucose, Bld: 87 mg/dL (ref 70–99)
Potassium: 4.1 mEq/L (ref 3.5–5.1)
Sodium: 137 mEq/L (ref 135–145)
Total Bilirubin: 0.8 mg/dL (ref 0.2–1.2)
Total Protein: 7 g/dL (ref 6.0–8.3)

## 2022-09-29 MED ORDER — AMLODIPINE BESYLATE 10 MG PO TABS
10.0000 mg | ORAL_TABLET | Freq: Every day | ORAL | 1 refills | Status: DC
  Filled 2022-09-29: qty 90, 90d supply, fill #0
  Filled 2023-03-12: qty 90, 90d supply, fill #1

## 2022-09-29 NOTE — Patient Instructions (Addendum)
  The new goals for optimal blood pressure management are 120/70 to 130/80  .  Please  increase your amlodipine dose to 10 mg check your blood pressure after one week  at home and send me the readings so I can determine if yif has been effective.  You can use the furosemide every other day if needed for fluid retention.  Please increase your exercise regimen !  [facility] alone will not get you to your goal    Ok to use famotidine 20 mg every 12 hours or substitute omeprazole 20 mg for the heartburn

## 2022-09-29 NOTE — Assessment & Plan Note (Addendum)
Not at goal.  Increase amlodipine to 10 mg daily . May need a daily or qod diuretic .  Has furosemide . ACE I and ARBS are C/I due to adverse reaction to lisinopril

## 2022-09-29 NOTE — Progress Notes (Signed)
Subjective:  Patient ID: Carla Mckinney, female    DOB: [birth year]  Age: 61 y.o. MRN: [id number]  CC: The primary encounter diagnosis was Hypercalcemia. Diagnoses of Essential hypertension, Need for Tdap vaccination, Impaired fasting glucose, Primary hyperparathyroidism ([place]), and Paroxysmal supraventricular tachycardia were also pertinent to this visit.   HPI Carla Mckinney presents for  Chief Complaint  Patient presents with   Follow-up    Follow up on weight management    1) has been taking [name] for weight management since late May  and is currently on the 1 mg weekly dose since Sept 26 .  She has lost 16 lbs since May.  She is only walking for exercise for the past 4-5 weeks , but walking with her father who uses a cane so not  exerting herself  owns a stationery bike but not using it currently  . Losing 1 lb per week     2) HTN:  taking amlodipine , metoprolol  for concurrent tachycardia.  Home readings have been checked and have not been above 948 systolic  but her diastolic  readings have been > 80 but <  88   3) hypercalcemia .  She was referred in march for Endocrinology,  her  appt is scheduled in November (referral made in march)  Outpatient Medications Prior to Visit  Medication Sig Dispense Refill   aspirin 81 MG tablet Take 81 mg by mouth daily.     cholecalciferol (VITAMIN D3) 25 MCG (1000 UNIT) tablet Take 1,000 Units by mouth daily.     diltiazem (CARDIZEM) 30 MG tablet Take 1 tablet (30 mg total) by mouth as needed (rapid heart rate). 90 tablet 0   furosemide (LASIX) 20 MG tablet Take 1 tablet (20 mg total) by mouth daily as needed. 90 tablet 1   ibuprofen (ADVIL,MOTRIN) 200 MG tablet Take 200 mg by mouth as needed.     metoprolol succinate (TOPROL-XL) 100 MG 24 hr tablet TAKE 1 TABLET BY MOUTH DAILY 90 tablet 1   Semaglutide-Weight Management 1 MG/0.5ML SOAJ Inject 1 mg into the skin once a week. 2 mL 2   amLODipine (NORVASC) 5 MG tablet Take 1 tablet (5 mg  total) by mouth daily. 90 tablet 1   benzonatate (TESSALON) 100 MG capsule Take 1 capsule (100 mg total) by mouth 3 (three) times daily as needed for cough. 21 capsule 0   No facility-administered medications prior to visit.    Review of Systems;  Patient denies headache, fevers, malaise, unintentional weight loss, skin rash, eye pain, sinus congestion and sinus pain, sore throat, dysphagia,  hemoptysis , cough, dyspnea, wheezing, chest pain, palpitations, orthopnea, edema, abdominal pain, nausea, melena, diarrhea, constipation, flank pain, dysuria, hematuria, urinary  Frequency, nocturia, numbness, tingling, seizures,  Focal weakness, Loss of consciousness,  Tremor, insomnia, depression, anxiety, and suicidal ideation.      Objective:  BP (!) 142/98 (BP Location: Left Arm, Patient Position: Sitting, Cuff Size: Normal)   Pulse 70   Temp 98.6 F (37 C) (Oral)   Ht '5\' 3"'$  (1.6 m)   Wt 173 lb 12.8 oz (78.8 kg)   SpO2 96%   BMI 30.79 kg/m   BP Readings from Last 3 Encounters:  09/29/22 (!) 142/98  05/19/22 (!) 150/88  05/04/22 134/88    Wt Readings from Last 3 Encounters:  09/29/22 173 lb 12.8 oz (78.8 kg)  05/04/22 188 lb 12.8 oz (85.6 kg)  03/09/22 189 lb 12.8 oz (86.1 kg)  General appearance: alert, cooperative and appears stated age Ears: normal TM's and external ear canals both ears Throat: lips, mucosa, and tongue normal; teeth and gums normal Neck: no adenopathy, no carotid bruit, supple, symmetrical, trachea midline and thyroid not enlarged, symmetric, no tenderness/mass/nodules Back: symmetric, no curvature. ROM normal. No CVA tenderness. Lungs: clear to auscultation bilaterally Heart: regular rate and rhythm, S1, S2 normal, no murmur, click, rub or gallop Abdomen: soft, non-tender; bowel sounds normal; no masses,  no organomegaly Pulses: 2+ and symmetric Skin: Skin color, texture, turgor normal. No rashes or lesions Lymph nodes: Cervical, supraclavicular, and  axillary nodes normal. Neuro:  awake and interactive with normal mood and affect. Higher cortical functions are normal. Speech is clear without word-finding difficulty or dysarthria. Extraocular movements are intact. Visual fields of both eyes are grossly intact. Sensation to light touch is grossly intact bilaterally of upper and lower extremities. Motor examination shows 4+/5 symmetric hand grip and upper extremity and 5/5 lower extremity strength. There is no pronation or drift. Gait is non-ataxic   Lab Results  Component Value Date   HGBA1C 5.3 05/04/2022   HGBA1C 5.3 03/02/2018   HGBA1C 5.3 12/24/2016    Lab Results  Component Value Date   CREATININE 0.63 02/20/2022   CREATININE 0.66 02/12/2022   CREATININE 0.65 04/28/2021    Lab Results  Component Value Date   WBC 5.6 02/12/2022   HGB 13.2 02/12/2022   HCT 39.6 02/12/2022   PLT 266.0 02/12/2022   GLUCOSE 94 02/20/2022   CHOL 254 (H) 02/12/2022   TRIG 81.0 02/12/2022   HDL 65.30 02/12/2022   LDLDIRECT 154.1 02/23/2013   LDLCALC 172 (H) 02/12/2022   ALT 20 02/12/2022   AST 17 02/12/2022   NA 138 02/20/2022   K 4.4 02/20/2022   CL 101 02/20/2022   CREATININE 0.63 02/20/2022   BUN 10 02/20/2022   CO2 27 02/20/2022   TSH 2.78 04/28/2021   HGBA1C 5.3 05/04/2022   MICROALBUR <0.7 02/20/2022    No results found.  Assessment & Plan:   Problem List Items Addressed This Visit     Essential hypertension    Not at goal.  Increase amlodipine to 10 mg daily . May need a daily or qod diuretic .  Has furosemide . ACE I and ARBS are C/I due to adverse reaction to lisinopril       Relevant Medications   amLODipine (NORVASC) 10 MG tablet   Paroxysmal supraventricular tachycardia    Managed with infrequent use of diltiazem       Relevant Medications   amLODipine (NORVASC) 10 MG tablet   Hypercalcemia - Primary   Relevant Orders   Comprehensive metabolic panel   PTH, Intact and Calcium   Primary hyperparathyroidism  ([place])    With inappropriately normal PTH,  Osteopenia by DEXA.  Awaiting Endocrinology referral.  Repeating levels today       Impaired fasting glucose    a1cs have been repeatedly normal,   Lab Results  Component Value Date   HGBA1C 5.3 05/04/2022         Other Visit Diagnoses     Need for Tdap vaccination       Relevant Orders   Tdap vaccine greater than or equal to 7yo IM (Completed)       I spent a total of  32 minutes with this patient in a face to face visit on the date of this encounter reviewing the last office visit with me in  March ,  most recent visit with cardiology ,     patient's diet and exercise habits, home blood pressure readings,  and post visit ordering of testing and therapeutics.    Follow-up: Return in about 6 months (around 03/31/2023).   Crecencio Mc, MD

## 2022-09-29 NOTE — Assessment & Plan Note (Signed)
Managed with infrequent use of diltiazem

## 2022-09-29 NOTE — Assessment & Plan Note (Signed)
With inappropriately normal PTH,  Osteopenia by DEXA.  Awaiting Endocrinology referral.  Repeating levels today

## 2022-09-29 NOTE — Assessment & Plan Note (Signed)
a1cs have been repeatedly normal,   Lab Results  Component Value Date   HGBA1C 5.3 05/04/2022

## 2022-09-30 LAB — PTH, INTACT AND CALCIUM
Calcium: 10.8 mg/dL — ABNORMAL HIGH (ref 8.6–10.4)
PTH: 35 pg/mL (ref 16–77)

## 2022-10-01 ENCOUNTER — Other Ambulatory Visit: Payer: Self-pay

## 2022-10-05 ENCOUNTER — Other Ambulatory Visit: Payer: Self-pay

## 2022-10-07 ENCOUNTER — Other Ambulatory Visit: Payer: Self-pay

## 2022-10-21 ENCOUNTER — Ambulatory Visit (INDEPENDENT_AMBULATORY_CARE_PROVIDER_SITE_OTHER): Payer: 59 | Admitting: Internal Medicine

## 2022-10-21 ENCOUNTER — Encounter: Payer: Self-pay | Admitting: Internal Medicine

## 2022-10-21 VITALS — BP 128/82 | HR 78 | Ht 63.0 in | Wt 171.8 lb

## 2022-10-21 DIAGNOSIS — E210 Primary hyperparathyroidism: Secondary | ICD-10-CM

## 2022-10-21 LAB — BASIC METABOLIC PANEL
BUN: 6 mg/dL (ref 6–23)
CO2: 29 mEq/L (ref 19–32)
Calcium: 10.6 mg/dL — ABNORMAL HIGH (ref 8.4–10.5)
Chloride: 104 mEq/L (ref 96–112)
Creatinine, Ser: 0.58 mg/dL (ref 0.40–1.20)
GFR: 99.43 mL/min (ref >60.00)
Glucose, Bld: 81 mg/dL (ref 70–99)
Potassium: 4.3 mEq/L (ref 3.5–5.1)
Sodium: 138 mEq/L (ref 135–145)

## 2022-10-21 LAB — ALBUMIN: Albumin: 4.3 g/dL (ref 3.5–5.2)

## 2022-10-21 LAB — VITAMIN D 25 HYDROXY (VIT D DEFICIENCY, FRACTURES): VITD: 35.15 ng/mL (ref 30.00–100.00)

## 2022-10-21 NOTE — Progress Notes (Unsigned)
Name: Carla Mckinney  MRN/ DOB: [id number], [birth year]    Age/ Sex: 61 y.o., female    PCP: Crecencio Mc, MD   Reason for Endocrinology Evaluation: Hypercalcemia      Date of Initial Endocrinology Evaluation: 10/21/2022     HPI: [name] is a 61 y.o. female with a past medical history of HTN. The patient presented for initial endocrinology clinic visit on 10/21/2022 for consultative assistance with her Hypercalcemia .   Carla Mckinney indicates that she was first noted with elevated serum calcium since 2017, with a max level of 10.9  mg/dL (corrected 10.74) on 09/29/2022 with an inappropriately normal PTH at 35 PG/mL and normal vitamin D.   Since that time, she has experienced symptoms of polyuria, polydipsia, but denies constipation or body/muscle aches. She takes over the counter calcium (including supplements, Tums, Rolaids, or other calcium containing antacids), but not on  lithium, HCTZ  She is on  vitamin D 1000 iu daily   She denies  history of kidney stones, kidney disease, liver disease, granulomatous disease. She denies  osteoporosis or prior fractures. Daily dietary calcium intake: 1 servings . She has  family history of osteoporosis (mother) ,but no  parathyroid disease, thyroid disease.    DXA low bone density T score -1.8 at [facility] 04/29/2022       HISTORY:  Past Medical History:  Past Medical History:  Diagnosis Date   Hypertension    Rheumatic fever 1986   has had echo since then. no cardiac issues.   Past Surgical History:  Past Surgical History:  Procedure Laterality Date   ABDOMINAL HYSTERECTOMY     AUGMENTATION MAMMAPLASTY Bilateral    breast lift   CARDIAC CATHETERIZATION  2002   normal, due to syncope and abnormal myowiew ([place])   COLONOSCOPY WITH PROPOFOL N/A 09/15/2017   Procedure: COLONOSCOPY WITH PROPOFOL;  Surgeon: Lin Landsman, MD;  Location: [facility];  Service: Gastroenterology;  Laterality: N/A;   MASTOPEXY   2008   OOPHORECTOMY     secondary to scar tissue,  [facility]   RIGHT OOPHORECTOMY     TONSILLECTOMY     TUBAL LIGATION      Social History:  reports that she has never smoked. She has never used smokeless tobacco. She reports current alcohol use of about 1.0 standard drink of alcohol per week. She reports that she does not use drugs. Family History: family history includes Cancer in her paternal grandfather; Heart disease in her maternal grandfather, mother, and paternal grandmother; Heart failure in her mother; Hyperlipidemia in her father; Hypertension in her father; Stroke (age of onset: 32) in her paternal grandmother.   HOME MEDICATIONS: Allergies as of 10/21/2022       Reactions   Lisinopril Swelling        Medication List        Accurate as of October 21, 2022  8:34 AM. If you have any questions, ask your nurse or doctor.          amLODipine 10 MG tablet Commonly known as: NORVASC Take 1 tablet (10 mg total) by mouth daily.   aspirin 81 MG tablet Take 81 mg by mouth daily.   cholecalciferol 25 MCG (1000 UNIT) tablet Commonly known as: VITAMIN D3 Take 1,000 Units by mouth daily.   diltiazem 30 MG tablet Commonly known as: Cardizem Take 1 tablet (30 mg total) by mouth as needed (rapid heart rate).   furosemide 20 MG tablet Commonly  known as: LASIX Take 1 tablet (20 mg total) by mouth daily as needed.   ibuprofen 200 MG tablet Commonly known as: ADVIL Take 200 mg by mouth as needed.   metoprolol succinate 100 MG 24 hr tablet Commonly known as: TOPROL-XL TAKE 1 TABLET BY MOUTH DAILY   Wegovy 1 MG/0.5ML Soaj Generic drug: Semaglutide-Weight Management Inject 1 mg into the skin once a week.          REVIEW OF SYSTEMS: A comprehensive ROS was conducted with the patient and is negative except as per HPI and below:  ROS     OBJECTIVE:  VS: BP 128/82 (BP Location: Left Arm, Patient Position: Sitting, Cuff Size: Large)   Pulse 78   Ht '5\' 3"'$  (1.6 m)    Wt 171 lb 12.8 oz (77.9 kg)   SpO2 98%   BMI 30.43 kg/m    Wt Readings from Last 3 Encounters:  10/21/22 171 lb 12.8 oz (77.9 kg)  09/29/22 173 lb 12.8 oz (78.8 kg)  05/04/22 188 lb 12.8 oz (85.6 kg)     EXAM: General: Pt appears well and is in NAD  Eyes: External eye exam normal without stare, lid lag or exophthalmos.  EOM intact.  PERRL.  Neck: General: Supple without adenopathy. Thyroid: Thyroid size normal.  No goiter or nodules appreciated. No thyroid bruit.  Lungs: Clear with good BS bilat with no rales, rhonchi, or wheezes  Heart: Auscultation: RRR.  Abdomen: Normoactive bowel sounds, soft, nontender, without masses or organomegaly palpable  Extremities:  BL LE: No pretibial edema normal ROM and strength.  Mental Status: Judgment, insight: Intact Orientation: Oriented to time, place, and person Mood and affect: No depression, anxiety, or agitation     DATA REVIEWED: ***   Old records , labs and images have been reviewed.   ASSESSMENT/PLAN/RECOMMENDATIONS:   Hypercalcemia :  - Most likely primary hyperparathyroidism - Discussed pathophysiology of hyperparathyroidism  - Patient needs further evaluation to determine surgical candidacy - DXA shows low bone density 04/2022 - Will proceed with 24-hr urine collection    Recommendations  - Encouraged hydration  - AVOID CALCIUM SUPPLEMENTS, AVOID LOW CALCIUM DIET - Maintain normal dietary calcium intake (2-3 servings of dairy a day)  F/U in 6 months   Signed electronically by: Mack Guise, MD  [facility] Endocrin[facility]  [facility] [address] [place], [place] [address] Phone: [phone] FAX: [phone]   CC: Crecencio Mc, MD [address] [place] [address] Phone: [phone] Fax: [phone]   Return to Endocrinology clinic as below: No future appointments.

## 2022-10-21 NOTE — Patient Instructions (Addendum)
Stay Hydrated  Avoid over the counter calcium tablets Consume 2-3 servings of dietary calcium daily      24-Hour Urine Collection  You will be collecting your urine for a 24-hour period of time. Your timer starts with your first urine of the morning (For example - If you first pee at [place], your timer will start at [place]) [place] away your first urine of the morning Collect your urine every time you pee for the next 24 hours STOP your urine collection 24 hours after you started the collection (For example - You would stop at 9AM the day after you started)

## 2022-10-22 LAB — PARATHYROID HORMONE, INTACT (NO CA): PTH: 51 pg/mL (ref 16–77)

## 2022-10-28 ENCOUNTER — Other Ambulatory Visit: Payer: 59

## 2022-10-28 DIAGNOSIS — E210 Primary hyperparathyroidism: Secondary | ICD-10-CM | POA: Diagnosis not present

## 2022-10-28 NOTE — Progress Notes (Unsigned)
Start date:10-27-22 5am End date:10-28-22 5am Total volume=2,550

## 2022-10-29 LAB — CALCIUM, URINE, 24 HOUR: Calcium, 24H Urine: 268 mg/24 h — ABNORMAL HIGH

## 2022-10-29 LAB — CREATININE, URINE, 24 HOUR: Creatinine, 24H Ur: 0.79 g/(24.h) (ref 0.50–2.15)

## 2022-11-09 ENCOUNTER — Other Ambulatory Visit: Payer: Self-pay

## 2022-11-09 ENCOUNTER — Encounter: Payer: Self-pay | Admitting: Internal Medicine

## 2022-11-09 MED ORDER — SEMAGLUTIDE-WEIGHT MANAGEMENT 1.7 MG/0.75ML ~~LOC~~ SOAJ
1.7000 mg | SUBCUTANEOUS | 0 refills | Status: DC
  Filled 2022-11-09: qty 3, 28d supply, fill #0

## 2022-11-10 ENCOUNTER — Other Ambulatory Visit: Payer: Self-pay

## 2022-11-11 ENCOUNTER — Other Ambulatory Visit: Payer: Self-pay

## 2022-12-01 ENCOUNTER — Encounter: Payer: Self-pay | Admitting: Internal Medicine

## 2022-12-01 NOTE — Progress Notes (Signed)
This encounter was created in error - please disregard.

## 2022-12-02 MED ORDER — SEMAGLUTIDE-WEIGHT MANAGEMENT 1.7 MG/0.75ML ~~LOC~~ SOAJ
1.7000 mg | SUBCUTANEOUS | 0 refills | Status: DC
  Filled 2022-12-02: qty 3, 28d supply, fill #0

## 2022-12-03 ENCOUNTER — Other Ambulatory Visit: Payer: Self-pay | Admitting: Internal Medicine

## 2022-12-03 ENCOUNTER — Other Ambulatory Visit: Payer: Self-pay

## 2022-12-03 MED ORDER — DILTIAZEM HCL 30 MG PO TABS
30.0000 mg | ORAL_TABLET | ORAL | 2 refills | Status: DC | PRN
  Filled 2022-12-03: qty 90, 90d supply, fill #0

## 2022-12-03 NOTE — Telephone Encounter (Signed)
Pt is requesting a refill but it has not been refilled since 2021.

## 2022-12-04 ENCOUNTER — Other Ambulatory Visit: Payer: Self-pay

## 2023-01-04 ENCOUNTER — Other Ambulatory Visit: Payer: Self-pay | Admitting: Internal Medicine

## 2023-01-04 ENCOUNTER — Other Ambulatory Visit: Payer: Self-pay

## 2023-01-04 DIAGNOSIS — I10 Essential (primary) hypertension: Secondary | ICD-10-CM

## 2023-01-04 MED FILL — Semaglutide (Weight Mngmt) Soln Auto-Injector 1.7 MG/0.75ML: SUBCUTANEOUS | 28 days supply | Qty: 3 | Fill #0 | Status: CN

## 2023-01-04 MED FILL — Metoprolol Succinate Tab ER 24HR 100 MG (Tartrate Equiv): ORAL | 90 days supply | Qty: 90 | Fill #0 | Status: AC

## 2023-01-06 ENCOUNTER — Other Ambulatory Visit: Payer: Self-pay

## 2023-01-07 ENCOUNTER — Other Ambulatory Visit: Payer: Self-pay

## 2023-01-07 MED FILL — Semaglutide (Weight Mngmt) Soln Auto-Injector 1.7 MG/0.75ML: SUBCUTANEOUS | 28 days supply | Qty: 3 | Fill #0 | Status: CN

## 2023-01-12 ENCOUNTER — Other Ambulatory Visit: Payer: Self-pay

## 2023-02-02 ENCOUNTER — Telehealth: Payer: Self-pay

## 2023-02-02 NOTE — Telephone Encounter (Signed)
Pharmacy Patient Advocate Encounter  Received notification from [employer] that the request for prior authorization for [facility] has been denied due to .    Please be advised we currently do not have a Pharmacist to review denials, therefore you will need to process appeals accordingly as needed. Thanks for your support at this time.   You may call [phone] or fax [phone], to appeal.

## 2023-02-17 NOTE — Telephone Encounter (Signed)
PA needed for [facility]. Pt is enrolled in [employer]

## 2023-02-18 ENCOUNTER — Other Ambulatory Visit: Payer: Self-pay

## 2023-02-18 MED FILL — Semaglutide (Weight Mngmt) Soln Auto-Injector 1.7 MG/0.75ML: SUBCUTANEOUS | 28 days supply | Qty: 3 | Fill #0 | Status: CN

## 2023-03-09 ENCOUNTER — Other Ambulatory Visit (HOSPITAL_COMMUNITY): Payer: Self-pay

## 2023-03-12 ENCOUNTER — Other Ambulatory Visit: Payer: Self-pay

## 2023-03-12 MED FILL — Metoprolol Succinate Tab ER 24HR 100 MG (Tartrate Equiv): ORAL | 90 days supply | Qty: 90 | Fill #1 | Status: AC

## 2023-03-12 MED FILL — Semaglutide (Weight Mngmt) Soln Auto-Injector 1.7 MG/0.75ML: SUBCUTANEOUS | 28 days supply | Qty: 3 | Fill #0 | Status: CN

## 2023-03-12 NOTE — Telephone Encounter (Signed)
If okay I will get a PA done for the [facility].

## 2023-03-15 NOTE — Telephone Encounter (Signed)
PA for Wegovy is needed.  

## 2023-03-16 ENCOUNTER — Other Ambulatory Visit: Payer: Self-pay

## 2023-03-17 ENCOUNTER — Other Ambulatory Visit (HOSPITAL_COMMUNITY): Payer: Self-pay

## 2023-03-17 ENCOUNTER — Telehealth: Payer: Self-pay

## 2023-03-17 NOTE — Telephone Encounter (Signed)
Pharmacy Patient Advocate Encounter   Received notification that prior authorization for Wegovy 1.7MG /0.75ML auto-injectors is required/requested.  Per Test Claim: PA required   PA submitted on 03/17/23 to (ins) [employer] via CoverMyMeds Key or (Medicaid) confirmation # [id number] Status is pending

## 2023-03-18 NOTE — Telephone Encounter (Signed)
Pharmacy Patient Advocate Encounter  Received notification from [employer] that the request for prior authorization for Wegovy 1.7MG /0.75ML auto-injectors has been denied due to Plan exclusion.       Please be advised we currently do not have a Pharmacist to review denials, therefore you will need to process appeals accordingly as needed. Thanks for your support at this time.   You may call  [phone] or fax [phone], to appeal. E-Appeal is also available (key: [id number])

## 2023-03-18 NOTE — Telephone Encounter (Signed)
Appeal has been submitted

## 2023-03-19 ENCOUNTER — Other Ambulatory Visit: Payer: Self-pay

## 2023-03-24 ENCOUNTER — Other Ambulatory Visit: Payer: Self-pay

## 2023-03-24 NOTE — Telephone Encounter (Signed)
Pharmacy Patient Advocate Encounter  Received notification from [employer] that the appeal for Carla Mckinney has been denied due to plan exclusion.      Denial letter attached to charts

## 2023-03-24 NOTE — Telephone Encounter (Signed)
Pt is aware.  

## 2023-03-24 NOTE — Telephone Encounter (Signed)
Denied again

## 2023-03-31 ENCOUNTER — Other Ambulatory Visit: Payer: Self-pay | Admitting: Internal Medicine

## 2023-03-31 DIAGNOSIS — Z1231 Encounter for screening mammogram for malignant neoplasm of breast: Secondary | ICD-10-CM

## 2023-04-21 ENCOUNTER — Encounter: Payer: Self-pay | Admitting: Internal Medicine

## 2023-04-21 ENCOUNTER — Ambulatory Visit (INDEPENDENT_AMBULATORY_CARE_PROVIDER_SITE_OTHER): Payer: 59 | Admitting: Internal Medicine

## 2023-04-21 VITALS — BP 120/80 | HR 66 | Ht 63.0 in | Wt 170.0 lb

## 2023-04-21 DIAGNOSIS — E210 Primary hyperparathyroidism: Secondary | ICD-10-CM | POA: Diagnosis not present

## 2023-04-21 LAB — BASIC METABOLIC PANEL
BUN: 9 mg/dL (ref 6–23)
CO2: 27 mEq/L (ref 19–32)
Calcium: 10 mg/dL (ref 8.4–10.5)
Chloride: 107 mEq/L (ref 96–112)
Creatinine, Ser: 0.69 mg/dL (ref 0.40–1.20)
GFR: 95.02 mL/min (ref >60.00)
Glucose, Bld: 88 mg/dL (ref 70–99)
Potassium: 4.8 mEq/L (ref 3.5–5.1)
Sodium: 140 mEq/L (ref 135–145)

## 2023-04-21 LAB — ALBUMIN: Albumin: 4.1 g/dL (ref 3.5–5.2)

## 2023-04-21 LAB — VITAMIN D 25 HYDROXY (VIT D DEFICIENCY, FRACTURES): VITD: 26.03 ng/mL — ABNORMAL LOW (ref 30.00–100.00)

## 2023-04-21 NOTE — Progress Notes (Unsigned)
Name: Carla Mckinney  MRN/ DOB: [id number], [birth year]    Age/ Sex: 62 y.o., female    PCP: Sherlene Shams, MD   Reason for Endocrinology Evaluation: Hypercalcemia      Date of Initial Endocrinology Evaluation: 10/21/2022    HPI: Carla Mckinney is a 62 y.o. female with a past medical history of HTN. The patient presented for initial endocrinology clinic visit on 10/21/2022 for consultative assistance with her Hypercalcemia .   Carla Mckinney indicates that she was first noted with elevated serum calcium since 2017, with a max level of 10.9  mg/dL (corrected 40.98) on 11/91/4782 with an inappropriately normal PTH at 35 PG/mL and normal vitamin D.   She denies  history of kidney stones, kidney disease, liver disease, granulomatous disease. She denies  osteoporosis or prior fractures.  She has  family history of osteoporosis (mother) ,but no  parathyroid disease, thyroid disease.    DXA low bone density T score -1.8 at LFN 04/29/2022  24-hour urinary calcium elevated 268 Mg 10/2022  SUBJECTIVE:    Today (04/21/23):  Carla Mckinney is here for follow-up on hypercalcemia.  She stopped MVI daily  Denies polydipsia no polyuria  Denies constipation  Denies recent falls  No renal stones  She consumes 2-3 servings of calcium rich food  She is on furosemide PRN only    Vitamin D 1000 iu daily   HISTORY:  Past Medical History:  Past Medical History:  Diagnosis Date   Hypertension    Rheumatic fever 1986   has had echo since then. no cardiac issues.   Past Surgical History:  Past Surgical History:  Procedure Laterality Date   ABDOMINAL HYSTERECTOMY     AUGMENTATION MAMMAPLASTY Bilateral    breast lift   CARDIAC CATHETERIZATION  2002   normal, due to syncope and abnormal myowiew ([place])   COLONOSCOPY WITH PROPOFOL N/A 09/15/2017   Procedure: COLONOSCOPY WITH PROPOFOL;  Surgeon: Toney Reil, MD;  Location: [facility];  Service: Gastroenterology;   Laterality: N/A;   MASTOPEXY  2008   OOPHORECTOMY     secondary to scar tissue,  [facility]   RIGHT OOPHORECTOMY     TONSILLECTOMY     TUBAL LIGATION      Social History:  reports that she has never smoked. She has never used smokeless tobacco. She reports current alcohol use of about 1.0 standard drink of alcohol per week. She reports that she does not use drugs. Family History: family history includes Cancer in her paternal grandfather; Heart disease in her maternal grandfather, mother, and paternal grandmother; Heart failure in her mother; Hyperlipidemia in her father; Hypertension in her father; Stroke (age of onset: 69) in her paternal grandmother.   HOME MEDICATIONS: Allergies as of 04/21/2023       Reactions   Lisinopril Swelling        Medication List        Accurate as of Apr 21, 2023  9:03 AM. If you have any questions, ask your nurse or doctor.          STOP taking these medications    Wegovy 1.7 MG/0.75ML Soaj Generic drug: Semaglutide-Weight Management Stopped by: Scarlette Shorts, MD       TAKE these medications    amLODipine 10 MG tablet Commonly known as: NORVASC Take 1 tablet (10 mg total) by mouth daily.   aspirin 81 MG tablet Take 81 mg by mouth daily.   cholecalciferol 25 MCG (1000  UNIT) tablet Commonly known as: VITAMIN D3 Take 1,000 Units by mouth daily.   diltiazem 30 MG tablet Commonly known as: Cardizem Take 1 tablet (30 mg total) by mouth as needed (rapid heart rate).   furosemide 20 MG tablet Commonly known as: LASIX Take 1 tablet (20 mg total) by mouth daily as needed.   ibuprofen 200 MG tablet Commonly known as: ADVIL Take 200 mg by mouth as needed.   metoprolol succinate 100 MG 24 hr tablet Commonly known as: TOPROL-XL Take 1 tablet (100 mg total) by mouth daily.          REVIEW OF SYSTEMS: A comprehensive ROS was conducted with the patient and is negative except as per HPI and below:  ROS     OBJECTIVE:  VS: BP  120/80 (BP Location: Left Arm, Patient Position: Sitting, Cuff Size: Small)   Pulse 66   Ht 5\' 3"  (1.6 m)   Wt 170 lb (77.1 kg)   SpO2 99%   BMI 30.11 kg/m    Wt Readings from Last 3 Encounters:  04/21/23 170 lb (77.1 kg)  10/21/22 171 lb 12.8 oz (77.9 kg)  09/29/22 173 lb 12.8 oz (78.8 kg)     EXAM: General: Pt appears well and is in NAD  Eyes: External eye exam normal without stare, lid lag or exophthalmos.  EOM intact.  PERRL.  Neck: General: Supple without adenopathy. Thyroid: Thyroid size normal.  No goiter or nodules appreciated. No thyroid bruit.  Lungs: Clear with good BS bilat with no rales, rhonchi, or wheezes  Heart: Auscultation: RRR.  Abdomen: Normoactive bowel sounds, soft, nontender, without masses or organomegaly palpable  Extremities:  BL LE: No pretibial edema normal ROM and strength.  Mental Status: Judgment, insight: Intact Orientation: Oriented to time, place, and person Mood and affect: No depression, anxiety, or agitation     DATA REVIEWED:   Latest Reference Range & Units 10/21/22 08:57  Sodium 135 - 145 mEq/L 138  Potassium 3.5 - 5.1 mEq/L 4.3  Chloride 96 - 112 mEq/L 104  CO2 19 - 32 mEq/L 29  Glucose 70 - 99 mg/dL 81  BUN 6 - 23 mg/dL 6  Creatinine 9.56 - 2.13 mg/dL 0.86  Calcium 8.4 - 57.8 mg/dL 46.9 (H)  Albumin 3.5 - 5.2 g/dL 4.3  GFR >62.95 mL/min 99.43  VITD 30.00 - 100.00 ng/mL 35.15      Latest Reference Range & Units 10/21/22 08:57  PTH, Intact 16 - 77 pg/mL 51    ASSESSMENT/PLAN/RECOMMENDATIONS:   Hypercalcemia :  - Most likely primary hyperparathyroidism - Discussed pathophysiology of hyperparathyroidism  - Patient needs further evaluation to determine surgical candidacy - DXA shows low bone density 04/2022 - 24-hr urine showed elevated calcium excretion at 268 Mg 10/2022   Recommendations  - Encouraged hydration  - AVOID CALCIUM SUPPLEMENTS, AVOID LOW CALCIUM DIET - Maintain normal dietary calcium intake (2-3  servings of dairy a day)  F/U in 6 months   Signed electronically by: Lyndle Herrlich, MD  [facility]  [facility] [address] [place], [place] [address] Phone: [phone] FAX: [phone]   CC: Sherlene Shams, MD [address] [place] [address] Phone: [phone] Fax: [phone]   Return to Endocrinology clinic as below: Future Appointments  Date Time Provider Department Center  04/27/2023  8:20 AM [facility]

## 2023-04-21 NOTE — Patient Instructions (Signed)
Stay Hydrated  Avoid over the counter calcium tablets Consume 2-3 servings of dietary calcium daily ( Low fat milk, yogurt, green leafy vegetables)     24-Hour Urine Collection  You will be collecting your urine for a 24-hour period of time. Your timer starts with your first urine of the morning (For example - If you first pee at 9AM, your timer will start at 9AM) Throw away your first urine of the morning Collect your urine every time you pee for the next 24 hours STOP your urine collection 24 hours after you started the collection (For example - You would stop at 9AM the day after you started)

## 2023-04-22 LAB — CALCIUM, IONIZED: Calcium, Ion: 5.7 mg/dL — ABNORMAL HIGH (ref 4.7–5.5)

## 2023-04-22 LAB — PARATHYROID HORMONE, INTACT (NO CA): PTH: 75 pg/mL (ref 16–77)

## 2023-04-27 ENCOUNTER — Ambulatory Visit
Admission: RE | Admit: 2023-04-27 | Discharge: 2023-04-27 | Disposition: A | Discharge status: Home / Self Care | Payer: 59 | Source: Ambulatory Visit | Attending: Internal Medicine | Admitting: Internal Medicine

## 2023-04-27 DIAGNOSIS — Z1231 Encounter for screening mammogram for malignant neoplasm of breast: Secondary | ICD-10-CM

## 2023-05-05 ENCOUNTER — Encounter: Payer: Self-pay | Admitting: Internal Medicine

## 2023-05-05 ENCOUNTER — Other Ambulatory Visit: Payer: 59

## 2023-05-05 DIAGNOSIS — E210 Primary hyperparathyroidism: Secondary | ICD-10-CM

## 2023-05-06 DIAGNOSIS — E210 Primary hyperparathyroidism: Secondary | ICD-10-CM | POA: Diagnosis not present

## 2023-05-06 NOTE — Progress Notes (Signed)
Total volume 2100 ml Start date 05/04/2023 end date 05/05/2023 start time 643am end time 636

## 2023-05-07 LAB — CALCIUM, URINE, 24 HOUR: Calcium, 24H Urine: 200 mg/24 h

## 2023-05-07 LAB — CREATININE, URINE, 24 HOUR: Creatinine, 24H Ur: 0.65 g/(24.h) (ref 0.50–2.15)

## 2023-07-20 ENCOUNTER — Other Ambulatory Visit: Payer: Self-pay | Admitting: Internal Medicine

## 2023-07-20 ENCOUNTER — Other Ambulatory Visit: Payer: Self-pay

## 2023-07-20 DIAGNOSIS — I10 Essential (primary) hypertension: Secondary | ICD-10-CM

## 2023-07-20 MED ORDER — METOPROLOL SUCCINATE ER 100 MG PO TB24
100.0000 mg | ORAL_TABLET | Freq: Every day | ORAL | 0 refills | Status: DC
Start: 2023-07-20 — End: 2023-10-04
  Filled 2023-07-20: qty 90, 90d supply, fill #0

## 2023-07-20 MED ORDER — AMLODIPINE BESYLATE 10 MG PO TABS
10.0000 mg | ORAL_TABLET | Freq: Every day | ORAL | 0 refills | Status: DC
Start: 2023-07-20 — End: 2023-10-04
  Filled 2023-07-20: qty 90, 90d supply, fill #0

## 2023-10-03 ENCOUNTER — Other Ambulatory Visit: Payer: Self-pay | Admitting: Internal Medicine

## 2023-10-03 DIAGNOSIS — I10 Essential (primary) hypertension: Secondary | ICD-10-CM

## 2023-10-04 ENCOUNTER — Other Ambulatory Visit: Payer: Self-pay

## 2023-10-04 ENCOUNTER — Other Ambulatory Visit: Payer: Self-pay | Admitting: Internal Medicine

## 2023-10-04 DIAGNOSIS — I10 Essential (primary) hypertension: Secondary | ICD-10-CM

## 2023-10-04 MED FILL — Amlodipine Besylate Tab 10 MG (Base Equivalent): ORAL | 30 days supply | Qty: 30 | Fill #0 | Status: CN

## 2023-10-04 MED FILL — Metoprolol Succinate Tab ER 24HR 100 MG (Tartrate Equiv): ORAL | 30 days supply | Qty: 30 | Fill #0 | Status: CN

## 2023-10-04 MED FILL — Amlodipine Besylate Tab 10 MG (Base Equivalent): ORAL | 30 days supply | Qty: 30 | Fill #0 | Status: AC

## 2023-10-04 MED FILL — Metoprolol Succinate Tab ER 24HR 100 MG (Tartrate Equiv): ORAL | 30 days supply | Qty: 30 | Fill #0 | Status: AC

## 2023-10-14 ENCOUNTER — Other Ambulatory Visit: Payer: Self-pay

## 2023-10-14 MED ORDER — AMOXICILLIN 500 MG PO CAPS
500.0000 mg | ORAL_CAPSULE | Freq: Three times a day (TID) | ORAL | 0 refills | Status: DC
  Filled 2023-10-14: qty 30, 10d supply, fill #0

## 2023-10-25 ENCOUNTER — Ambulatory Visit: Payer: Commercial Managed Care - PPO | Admitting: Internal Medicine

## 2023-11-16 ENCOUNTER — Ambulatory Visit: Payer: 59 | Admitting: Internal Medicine

## 2023-11-16 ENCOUNTER — Other Ambulatory Visit: Payer: Self-pay

## 2023-11-16 VITALS — BP 128/86 | HR 75 | Ht 63.0 in | Wt 184.4 lb

## 2023-11-16 DIAGNOSIS — R7301 Impaired fasting glucose: Secondary | ICD-10-CM | POA: Diagnosis not present

## 2023-11-16 DIAGNOSIS — D492 Neoplasm of unspecified behavior of bone, soft tissue, and skin: Secondary | ICD-10-CM | POA: Diagnosis not present

## 2023-11-16 DIAGNOSIS — E785 Hyperlipidemia, unspecified: Secondary | ICD-10-CM

## 2023-11-16 DIAGNOSIS — I10 Essential (primary) hypertension: Secondary | ICD-10-CM

## 2023-11-16 DIAGNOSIS — I471 Supraventricular tachycardia, unspecified: Secondary | ICD-10-CM | POA: Diagnosis not present

## 2023-11-16 DIAGNOSIS — E210 Primary hyperparathyroidism: Secondary | ICD-10-CM

## 2023-11-16 DIAGNOSIS — E7849 Other hyperlipidemia: Secondary | ICD-10-CM | POA: Diagnosis not present

## 2023-11-16 DIAGNOSIS — Z0000 Encounter for general adult medical examination without abnormal findings: Secondary | ICD-10-CM | POA: Diagnosis not present

## 2023-11-16 LAB — COMPREHENSIVE METABOLIC PANEL
ALT: 22 U/L (ref 0–35)
AST: 26 U/L (ref 0–37)
Albumin: 4.5 g/dL (ref 3.5–5.2)
Alkaline Phosphatase: 123 U/L — ABNORMAL HIGH (ref 39–117)
BUN: 10 mg/dL (ref 6–23)
CO2: 28 meq/L (ref 19–32)
Calcium: 11 mg/dL — ABNORMAL HIGH (ref 8.4–10.5)
Chloride: 103 meq/L (ref 96–112)
Creatinine, Ser: 0.71 mg/dL (ref 0.40–1.20)
GFR: 92.72 mL/min (ref >60.00)
Glucose, Bld: 87 mg/dL (ref 70–99)
Potassium: 4.5 meq/L (ref 3.5–5.1)
Sodium: 138 meq/L (ref 135–145)
Total Bilirubin: 0.7 mg/dL (ref 0.2–1.2)
Total Protein: 7 g/dL (ref 6.0–8.3)

## 2023-11-16 LAB — MICROALBUMIN / CREATININE URINE RATIO
Creatinine,U: 31.4 mg/dL
Microalb Creat Ratio: 2.2 mg/g (ref 0.0–30.0)
Microalb, Ur: <0.7 mg/dL (ref 0.0–1.9)

## 2023-11-16 LAB — CBC WITH DIFFERENTIAL/PLATELET
Basophils Absolute: 0.1 10*3/uL (ref 0.0–0.1)
Basophils Relative: 1.4 % (ref 0.0–3.0)
Eosinophils Absolute: 0.9 10*3/uL — ABNORMAL HIGH (ref 0.0–0.7)
Eosinophils Relative: 12 % — ABNORMAL HIGH (ref 0.0–5.0)
HCT: 41.3 % (ref 36.0–46.0)
Hemoglobin: 13.6 g/dL (ref 12.0–15.0)
Lymphocytes Relative: 23.5 % (ref 12.0–46.0)
Lymphs Abs: 1.8 10*3/uL (ref 0.7–4.0)
MCHC: 33.1 g/dL (ref 30.0–36.0)
MCV: 93.4 fL (ref 78.0–100.0)
Monocytes Absolute: 0.7 10*3/uL (ref 0.1–1.0)
Monocytes Relative: 9.3 % (ref 3.0–12.0)
Neutro Abs: 4.2 10*3/uL (ref 1.4–7.7)
Neutrophils Relative %: 53.8 % (ref 43.0–77.0)
Platelets: 332 10*3/uL (ref 150.0–400.0)
RBC: 4.42 Mil/uL (ref 3.87–5.11)
RDW: 12.9 % (ref 11.5–15.5)
WBC: 7.8 10*3/uL (ref 4.0–10.5)

## 2023-11-16 LAB — LIPID PANEL
Cholesterol: 280 mg/dL — ABNORMAL HIGH (ref 0–200)
HDL: 65.6 mg/dL (ref >39.00)
LDL Cholesterol: 198 mg/dL — ABNORMAL HIGH (ref 0–99)
NonHDL: 214.36
Total CHOL/HDL Ratio: 4
Triglycerides: 82 mg/dL (ref 0.0–149.0)
VLDL: 16.4 mg/dL (ref 0.0–40.0)

## 2023-11-16 LAB — LDL CHOLESTEROL, DIRECT: Direct LDL: 197 mg/dL

## 2023-11-16 LAB — TSH: TSH: 3.24 u[IU]/mL (ref 0.35–5.50)

## 2023-11-16 MED ORDER — METOPROLOL SUCCINATE ER 100 MG PO TB24
100.0000 mg | ORAL_TABLET | Freq: Every day | ORAL | 1 refills | Status: DC
  Filled 2023-11-16: qty 90, 90d supply, fill #0
  Filled 2024-02-10: qty 90, 90d supply, fill #1

## 2023-11-16 MED ORDER — HYDROCHLOROTHIAZIDE 25 MG PO TABS
25.0000 mg | ORAL_TABLET | Freq: Every day | ORAL | 3 refills | Status: DC
  Filled 2023-11-16: qty 90, 90d supply, fill #0

## 2023-11-16 MED ORDER — AMLODIPINE BESYLATE 10 MG PO TABS
10.0000 mg | ORAL_TABLET | Freq: Every day | ORAL | 1 refills | Status: DC
  Filled 2023-11-16: qty 90, 90d supply, fill #0
  Filled 2024-02-10: qty 90, 90d supply, fill #1

## 2023-11-16 NOTE — Assessment & Plan Note (Addendum)
Mild,recurrent  secondary to primary PTH.  Previously Normalized with cessation of calcium supplements. Will need endocrinology follow up and CMET  repeat in 2 weeks after adding hydrochlorothiazide for BP control

## 2023-11-16 NOTE — Assessment & Plan Note (Signed)
Right cheek  c/w basal cell CA, referring to [facility]

## 2023-11-16 NOTE — Assessment & Plan Note (Signed)

## 2023-11-16 NOTE — Assessment & Plan Note (Addendum)
She was referred to Endocrinology and had a 24 hr urinary calcium excretion assay repeated in May 2024 which had normalized after stopping calcium supplementation .  Starting hydrochlorothiazide today for uncontrolled hypertension on max doses of amlodipine and metoprolol.  Will check a basline ionized calcium/PTH today

## 2023-11-16 NOTE — Assessment & Plan Note (Signed)
Managed with daily use of metoprolol for concurrent hypertesnsion and infrequent use of diltiazem

## 2023-11-16 NOTE — Patient Instructions (Addendum)
You can try supplements Glucosamine/chrondroitin sulfate and fish oil, they may help the hip pain /arthritis   Referral to Willeen Niece for the spot on your right cheekbone  Adding hydrochlorothiazide 25 mg daily to get BP < 130/80.  Continue amlodipine and metoprolol

## 2023-11-16 NOTE — Assessment & Plan Note (Signed)
Screening for diabetes with aa1c was negative in 2023   Lab Results  Component Value Date   HGBA1C 5.3 05/04/2022

## 2023-11-16 NOTE — Progress Notes (Addendum)
Patient ID: Carla Mckinney, female    DOB: 1961-05-13  Age: 62 y.o. MRN: [id number]  The patient is here for annual preventive examination and management of other chronic and acute problems.   The risk factors are reflected in the social history.   The roster of all physicians providing medical care to patient - is listed in the Snapshot section of the chart.   Activities of daily living:  The patient is 100% independent in all ADLs: dressing, toileting, feeding as well as independent mobility   Home safety : The patient has smoke detectors in the home. They wear seatbelts.  There are no unsecured firearms at home. There is no violence in the home.    There is no risks for hepatitis, STDs or HIV. There is no   history of blood transfusion. They have no travel history to infectious disease endemic areas of the world.   The patient has seen their dentist in the last six month. They have seen their eye doctor in the last year. The patinet  denies slight hearing difficulty with regard to whispered voices and some television programs.  They have deferred audiologic testing in the last year.  They do not  have excessive sun exposure. Discussed the need for sun protection: hats, long sleeves and use of sunscreen if there is significant sun exposure.    Diet: the importance of a healthy diet is discussed. They do have a healthy diet.   The benefits of regular aerobic exercise were discussed. The patient  exercises  3 to 5 days per week  for  60 minutes.    Depression screen: there are no signs or vegative symptoms of depression- irritability, change in appetite, anhedonia, sadness/tearfullness.   The following portions of the patient's history were reviewed and updated as appropriate: allergies, current medications, past family history, past medical history,  past surgical history, past social history  and problem list.   Visual acuity was not assessed per patient preference since the patient has  regular follow up with an  ophthalmologist. Hearing and body mass index were assessed and reviewed.    During the course of the visit the patient was educated and counseled about appropriate screening and preventive services including : fall prevention , diabetes screening, nutrition counseling, colorectal cancer screening, and recommended immunizations.    Chief Complaint:   1) HTN:  taking amlodipine, metoprolol and diltiazem prn  ( rarely,  for HR >100).   Checks BP occasionally at home  .  Diastolic has b=never been > 80    Review of Symptoms  Patient denies headache, fevers, malaise, unintentional weight loss, skin rash, eye pain, sinus congestion and sinus pain, sore throat, dysphagia,  hemoptysis , cough, dyspnea, wheezing, chest pain, palpitations, orthopnea, edema, abdominal pain, nausea, melena, diarrhea, constipation, flank pain, dysuria, hematuria, urinary  Frequency, nocturia, numbness, tingling, seizures,  Focal weakness, Loss of consciousness,  Tremor, insomnia, depression, anxiety, and suicidal ideation.    Physical Exam:  BP 128/86   Pulse 75   Ht 5\' 3"  (1.6 m)   Wt 184 lb 6.4 oz (83.6 kg)   SpO2 97%   BMI 32.66 kg/m    Physical Exam Vitals reviewed.  Constitutional:      General: She is not in acute distress.    Appearance: Normal appearance. She is obese. She is not ill-appearing, toxic-appearing or diaphoretic.  HENT:     Head: Normocephalic.  Eyes:     General: No scleral icterus.  Right eye: No discharge.        Left eye: No discharge.     Conjunctiva/sclera: Conjunctivae normal.  Cardiovascular:     Rate and Rhythm: Normal rate and regular rhythm.     Heart sounds: Normal heart sounds.  Pulmonary:     Effort: Pulmonary effort is normal. No respiratory distress.     Breath sounds: Normal breath sounds.  Musculoskeletal:        General: Normal range of motion.  Skin:    General: Skin is warm and dry.  Neurological:     General: No focal  deficit present.     Mental Status: She is alert and oriented to person, place, and time. Mental status is at baseline.  Psychiatric:        Mood and Affect: Mood normal.        Behavior: Behavior normal.        Thought Content: Thought content normal.        Judgment: Judgment normal.    Assessment and Plan: Hyperlipidemia with target LDL less than 100 Assessment & Plan: Based on current lipid profile, the risk of clinically significant Coronary artery disease is <5% over the next 10 years, using the AHA risk calculator.  Diet and exercise advised.   Lab Results  Component Value Date   CHOL 280 (H) 11/16/2023   HDL 65.60 11/16/2023   LDLCALC 198 (H) 11/16/2023   LDLDIRECT 197.0 11/16/2023   TRIG 82.0 11/16/2023   CHOLHDL 4 11/16/2023     Orders: -     Lipid panel -     LDL cholesterol, direct -     CBC with Differential/Platelet  Essential hypertension -     amLODIPine Besylate; Take 1 tablet (10 mg total) by mouth daily.  Dispense: 90 tablet; Refill: 1 -     Comprehensive metabolic panel -     Microalbumin / creatinine urine ratio  Essential hypertension, benign -     Metoprolol Succinate ER; Take 1 tablet (100 mg total) by mouth daily.  Dispense: 90 tablet; Refill: 1  Primary hyperparathyroidism (HCC) Assessment & Plan: She was referred to Endocrinology and had a 24 hr urinary calcium excretion assay repeated in May 2024 which had normalized after stopping calcium supplementation .  Starting hydrochlorothiazide today for uncontrolled hypertension on max doses of amlodipine and metoprolol.  Will check a basline ionized calcium/PTH today   Orders: -     TSH -     PTH, intact and calcium  Impaired fasting glucose Assessment & Plan: Screening for diabetes with aa1c was negative in 2023   Lab Results  Component Value Date   HGBA1C 5.3 05/04/2022     Orders: -     Comprehensive metabolic panel  Neoplasm of skin of right cheek -     Ambulatory referral to  Dermatology  Skin neoplasm Assessment & Plan: Right cheek  c/w basal cell CA, referring to Willeen Niece    Paroxysmal supraventricular tachycardia (HCC) Assessment & Plan: Managed with daily use of metoprolol for concurrent hypertesnsion and infrequent use of diltiazem    Hypercalcemia Assessment & Plan: Mild,recurrent  secondary to primary PTH.  Previously Normalized with cessation of calcium supplements. Will need endocrinology follow up and CMET  repeat in 2 weeks after adding hydrochlorothiazide for BP control    Encounter for preventive health examination Assessment & Plan: age appropriate education and counseling updated, referrals for preventative services and immunizations addressed, dietary and smoking counseling addressed, most recent labs  reviewed.  I have personally reviewed and have noted:   1) the patient's medical and social history 2) The pt's use of alcohol, tobacco, and illicit drugs 3) The patient's current medications and supplements 4) Functional ability including ADL's, fall risk, home safety risk, hearing and visual impairment 5) Diet and physical activities 6) Evidence for depression or mood disorder 7) The patient's height, weight, and BMI have been recorded in the chart     I have made referrals, and provided counseling and education based on review of the above    Hyperlipidemia, familial, high LDL Assessment & Plan: Based on current lipid profile, the risk of clinically significant Coronary artery disease is <5% over the next 10 years, using the AHA risk calculator.  Diet and exercise advised.   Lab Results  Component Value Date   CHOL 280 (H) 11/16/2023   HDL 65.60 11/16/2023   LDLCALC 198 (H) 11/16/2023   LDLDIRECT 197.0 11/16/2023   TRIG 82.0 11/16/2023   CHOLHDL 4 11/16/2023      Other orders -     hydroCHLOROthiazide; Take 1 tablet (25 mg total) by mouth daily.  Dispense: 90 tablet; Refill: 3    No follow-ups on file.  Sherlene Shams, MD

## 2023-11-18 LAB — PTH, INTACT AND CALCIUM
Calcium: 11.2 mg/dL — ABNORMAL HIGH (ref 8.6–10.4)
PTH: 44 pg/mL (ref 16–77)

## 2023-11-20 ENCOUNTER — Encounter: Payer: Self-pay | Admitting: Internal Medicine

## 2023-11-20 DIAGNOSIS — D721 Eosinophilia, unspecified: Secondary | ICD-10-CM

## 2023-11-20 NOTE — Assessment & Plan Note (Addendum)
Based on current lipid profile, the risk of clinically significant Coronary artery disease is <5% over the next 10 years, using the AHA risk calculator.  Diet and exercise advised.   Lab Results  Component Value Date   CHOL 280 (H) 11/16/2023   HDL 65.60 11/16/2023   LDLCALC 198 (H) 11/16/2023   LDLDIRECT 197.0 11/16/2023   TRIG 82.0 11/16/2023   CHOLHDL 4 11/16/2023

## 2023-11-24 ENCOUNTER — Other Ambulatory Visit: Payer: Self-pay

## 2023-11-24 DIAGNOSIS — E349 Endocrine disorder, unspecified: Secondary | ICD-10-CM

## 2023-11-24 NOTE — Telephone Encounter (Signed)
Spoke to pt when scheduling appt for labs. Pt stated that she didn't have asthma or allergies in response to mychart message that was sent earlier today

## 2023-11-24 NOTE — Addendum Note (Signed)
Addended by: Kristie Cowman on: 11/24/2023 12:34 PM   Modules accepted: Orders

## 2023-11-30 ENCOUNTER — Other Ambulatory Visit (INDEPENDENT_AMBULATORY_CARE_PROVIDER_SITE_OTHER): Payer: 59

## 2023-11-30 DIAGNOSIS — D721 Eosinophilia, unspecified: Secondary | ICD-10-CM

## 2023-11-30 DIAGNOSIS — E349 Endocrine disorder, unspecified: Secondary | ICD-10-CM | POA: Diagnosis not present

## 2023-12-01 DIAGNOSIS — D721 Eosinophilia, unspecified: Secondary | ICD-10-CM | POA: Insufficient documentation

## 2023-12-01 LAB — CBC WITH DIFFERENTIAL/PLATELET
Basophils Absolute: 0.1 10*3/uL (ref 0.0–0.1)
Basophils Relative: 1.3 % (ref 0.0–3.0)
Eosinophils Absolute: 1.3 10*3/uL — ABNORMAL HIGH (ref 0.0–0.7)
Eosinophils Relative: 14.3 % — ABNORMAL HIGH (ref 0.0–5.0)
HCT: 39.1 % (ref 36.0–46.0)
Hemoglobin: 13 g/dL (ref 12.0–15.0)
Lymphocytes Relative: 23.9 % (ref 12.0–46.0)
Lymphs Abs: 2.2 10*3/uL (ref 0.7–4.0)
MCHC: 33.3 g/dL (ref 30.0–36.0)
MCV: 94 fL (ref 78.0–100.0)
Monocytes Absolute: 0.6 10*3/uL (ref 0.1–1.0)
Monocytes Relative: 6.4 % (ref 3.0–12.0)
Neutro Abs: 5 10*3/uL (ref 1.4–7.7)
Neutrophils Relative %: 54.1 % (ref 43.0–77.0)
Platelets: 327 10*3/uL (ref 150.0–400.0)
RBC: 4.16 Mil/uL (ref 3.87–5.11)
RDW: 12.9 % (ref 11.5–15.5)
WBC: 9.2 10*3/uL (ref 4.0–10.5)

## 2023-12-01 LAB — BASIC METABOLIC PANEL
BUN: 14 mg/dL (ref 6–23)
CO2: 28 meq/L (ref 19–32)
Calcium: 10.3 mg/dL (ref 8.4–10.5)
Chloride: 101 meq/L (ref 96–112)
Creatinine, Ser: 0.93 mg/dL (ref 0.40–1.20)
GFR: 67.05 mL/min (ref >60.00)
Glucose, Bld: 134 mg/dL — ABNORMAL HIGH (ref 70–99)
Potassium: 3.9 meq/L (ref 3.5–5.1)
Sodium: 137 meq/L (ref 135–145)

## 2023-12-01 NOTE — Assessment & Plan Note (Signed)
Asymptomatic.  AEC < 1.5  will repeat in one month after suspending NSAIDSs for one week

## 2023-12-01 NOTE — Addendum Note (Signed)
Addended by: Sherlene Shams on: 12/01/2023 12:41 PM   Modules accepted: Orders

## 2024-01-11 ENCOUNTER — Encounter: Payer: Self-pay | Admitting: Dermatology

## 2024-01-11 ENCOUNTER — Ambulatory Visit: Payer: 59 | Admitting: Dermatology

## 2024-01-11 DIAGNOSIS — L814 Other melanin hyperpigmentation: Secondary | ICD-10-CM | POA: Diagnosis not present

## 2024-01-11 DIAGNOSIS — L820 Inflamed seborrheic keratosis: Secondary | ICD-10-CM

## 2024-01-11 NOTE — Progress Notes (Signed)
   Follow-Up Visit   Subjective  Carla Mckinney is a 63 y.o. female who presents for the following: Spot on right cheek. Dur: ~4 months. Scratches top layer off. Peels, rough texture.   The patient has spots, moles and lesions to be evaluated, some may be new or changing and the patient may have concern these could be cancer.    The following portions of the chart were reviewed this encounter and updated as appropriate: medications, allergies, medical history  Review of Systems:  No other skin or systemic complaints except as noted in HPI or Assessment and Plan.  Objective  Well appearing patient in no apparent distress; mood and affect are within normal limits.  A focused examination was performed of the following areas: Face   Relevant physical exam findings are noted in the Assessment and Plan.  Right Preauricular Area x1 Erythematous keratotic or waxy stuck-on papule   Assessment & Plan   INFLAMED SEBORRHEIC KERATOSIS Right Preauricular Area x1 Symptomatic, irritating, patient would like treated. Destruction of lesion - Right Preauricular Area x1  Destruction method: cryotherapy   Informed consent: discussed and consent obtained   Lesion destroyed using liquid nitrogen: Yes   Region frozen until ice ball extended beyond lesion: Yes   Outcome: patient tolerated procedure well with no complications   Post-procedure details: wound care instructions given   Additional details:  Prior to procedure, discussed risks of blister formation, small wound, skin dyspigmentation, or rare scar following cryotherapy. Recommend Vaseline ointment to treated areas while healing.   LENTIGINES Exam: scattered tan macules face Due to sun exposure Treatment Plan: Benign-appearing, observe. Recommend daily broad spectrum sunscreen SPF 30+ to sun-exposed areas, reapply every 2 hours as needed.  Call for any changes   Return if symptoms worsen or fail to improve.  I, Lawson Radar, CMA, am  acting as scribe for Willeen Niece, MD.   Documentation: I have reviewed the above documentation for accuracy and completeness, and I agree with the above.  Willeen Niece, MD

## 2024-01-11 NOTE — Patient Instructions (Addendum)

## 2024-02-02 ENCOUNTER — Ambulatory Visit (INDEPENDENT_AMBULATORY_CARE_PROVIDER_SITE_OTHER): Payer: 59 | Admitting: Internal Medicine

## 2024-02-02 ENCOUNTER — Encounter: Payer: Self-pay | Admitting: Internal Medicine

## 2024-02-02 ENCOUNTER — Ambulatory Visit
Admission: RE | Admit: 2024-02-02 | Discharge: 2024-02-02 | Disposition: A | Discharge status: Home / Self Care | Payer: 59 | Source: Ambulatory Visit | Attending: Internal Medicine | Admitting: Internal Medicine

## 2024-02-02 VITALS — BP 118/72 | HR 64 | Ht 63.0 in | Wt 180.2 lb

## 2024-02-02 DIAGNOSIS — E210 Primary hyperparathyroidism: Secondary | ICD-10-CM | POA: Diagnosis not present

## 2024-02-02 DIAGNOSIS — N200 Calculus of kidney: Secondary | ICD-10-CM | POA: Diagnosis not present

## 2024-02-02 NOTE — Progress Notes (Signed)
Name: Carla Mckinney  MRN/ DOB: [id number], [birth year]    Age/ Sex: 63 y.o., female    PCP: Sherlene Shams, MD   Reason for Endocrinology Evaluation: Hypercalcemia      Date of Initial Endocrinology Evaluation: 10/21/2022    HPI: [name] is a 63 y.o. female with a past medical history of HTN. The patient presented for initial endocrinology clinic visit on 10/21/2022 for consultative assistance with her Hypercalcemia .   Carla Mckinney indicates that she was first noted with elevated serum calcium since 2017, with a max level of 10.9  mg/dL (corrected 78.29) on 56/21/3086 with an inappropriately normal PTH at 35 PG/mL and normal vitamin D.   She denies  history of kidney stones, kidney disease, liver disease, granulomatous disease. She denies  osteoporosis or prior fractures.  She has  family history of osteoporosis (mother) ,but no  parathyroid disease, thyroid disease.    DXA low bone density T score -1.8 at [facility] 04/29/2022  24-hour urinary calcium elevated 268 Mg 10/2022, repeat 200 mg 04/2023    She was started on HCTZ by her PCP 10/2023  SUBJECTIVE:    Today (02/02/24):  [name] is here for follow-up on hypercalcemia.  Denies polydipsia no polyuria  Denies constipation  Denies recent falls ,no bone fracture Had gastritis ~ 2 weeks ago but this has resolved  No renal stones  She consumes 2-3 servings of calcium rich food  She is on furosemide PRN only   Vitamin D 1000 iu daily   HISTORY:  Past Medical History:  Past Medical History:  Diagnosis Date   Hypertension    Rheumatic fever 1986   has had echo since then. no cardiac issues.   Past Surgical History:  Past Surgical History:  Procedure Laterality Date   ABDOMINAL HYSTERECTOMY     AUGMENTATION MAMMAPLASTY Bilateral    breast lift   CARDIAC CATHETERIZATION  2002   normal, due to syncope and abnormal myowiew ([facility])   COLONOSCOPY WITH PROPOFOL N/A 09/15/2017   Procedure:  COLONOSCOPY WITH PROPOFOL;  Surgeon: Toney Reil, MD;  Location: [facility];  Service: Gastroenterology;  Laterality: N/A;   MASTOPEXY  2008   OOPHORECTOMY     secondary to scar tissue,  [facility]   RIGHT OOPHORECTOMY     TONSILLECTOMY     TUBAL LIGATION      Social History:  reports that she has never smoked. She has never used smokeless tobacco. She reports current alcohol use of about 1.0 standard drink of alcohol per week. She reports that she does not use drugs. Family History: family history includes Cancer in her paternal grandfather; Heart disease in her maternal grandfather, mother, and paternal grandmother; Heart failure in her mother; Hyperlipidemia in her father; Hypertension in her father; Stroke (age of onset: 74) in her paternal grandmother.   HOME MEDICATIONS: Allergies as of 02/02/2024       Reactions   Lisinopril Swelling        Medication List        Accurate as of February 02, 2024  6:53 AM. If you have any questions, ask your nurse or doctor.          amLODipine 10 MG tablet Commonly known as: NORVASC Take 1 tablet (10 mg total) by mouth daily.   aspirin 81 MG tablet Take 81 mg by mouth daily.   cholecalciferol 25 MCG (1000 UNIT) tablet Commonly known as: VITAMIN D3 Take 1,000 Units  by mouth daily.   diltiazem 30 MG tablet Commonly known as: Cardizem Take 1 tablet (30 mg total) by mouth as needed (rapid heart rate).   furosemide 20 MG tablet Commonly known as: LASIX Take 1 tablet (20 mg total) by mouth daily as needed.   hydrochlorothiazide 25 MG tablet Commonly known as: HYDRODIURIL Take 1 tablet (25 mg total) by mouth daily.   ibuprofen 200 MG tablet Commonly known as: ADVIL Take 200 mg by mouth as needed.   metoprolol succinate 100 MG 24 hr tablet Commonly known as: TOPROL-XL Take 1 tablet (100 mg total) by mouth daily.          REVIEW OF SYSTEMS: A comprehensive ROS was conducted with the patient and is negative  except as per HPI    OBJECTIVE:  VS: There were no vitals taken for this visit.   Wt Readings from Last 3 Encounters:  11/16/23 184 lb 6.4 oz (83.6 kg)  04/21/23 170 lb (77.1 kg)  10/21/22 171 lb 12.8 oz (77.9 kg)     EXAM: General: Pt appears well and is in NAD  Neck:   No goiter or nodules appreciated.  Lungs: Clear with good BS bilat  Heart: Auscultation: RRR.  Abdomen:  soft, nontender  Extremities:  BL LE: trace pretibial edema normal   Mental Status: Judgment, insight: Intact Orientation: Oriented to time, place, and person Mood and affect: No depression, anxiety, or agitation     DATA REVIEWED:  Latest Reference Range & Units 11/30/23 14:10  Sodium 135 - 145 mEq/L 137  Potassium 3.5 - 5.1 mEq/L 3.9  Chloride 96 - 112 mEq/L 101  CO2 19 - 32 mEq/L 28  Glucose 70 - 99 mg/dL 098 (H)  BUN 6 - 23 mg/dL 14  Creatinine 1.19 - 1.47 mg/dL 8.29  Calcium 8.4 - 56.2 mg/dL 13.0     Latest Reference Range & Units 11/16/23 12:07  Sodium 135 - 145 mEq/L 138  Potassium 3.5 - 5.1 mEq/L 4.5  Chloride 96 - 112 mEq/L 103  CO2 19 - 32 mEq/L 28  Glucose 70 - 99 mg/dL 87  BUN 6 - 23 mg/dL 10  Creatinine 8.65 - 7.84 mg/dL 6.96  Calcium 8.6 - 29.5 mg/dL 8.4 - 28.4 mg/dL 13.2 (H) 44.0 (H)  Alkaline Phosphatase 39 - 117 U/L 123 (H)  Albumin 3.5 - 5.2 g/dL 4.5  AST 0 - 37 U/L 26  ALT 0 - 35 U/L 22  Total Protein 6.0 - 8.3 g/dL 7.0  Total Bilirubin 0.2 - 1.2 mg/dL 0.7  GFR >10.27 mL/min 92.72    Latest Reference Range & Units 11/16/23 12:07  PTH, Intact 16 - 77 pg/mL 44  TSH 0.35 - 5.50 uIU/mL 3.24         Latest Reference Range & Units 04/21/23 09:26  Calcium Ionized 4.7 - 5.5 mg/dL 5.7 (H)  (H): Data is abnormally high  Old records , labs and images have been reviewed.    ASSESSMENT/PLAN/RECOMMENDATIONS:   Primary Hyperparathyroidism:   - DXA shows low bone density 04/2022, she is scheduled for repeat 05/04/2023 - 24-hr urine showed elevated calcium excretion  at 268 Mg 10/2022, but this normalized at 200 Mg 04/2023 -Serum calcium has normalized with discontinuation of OTC calcium tablets and MVI.  She did have a slight elevation 11.2 mg/dL (corrected 25.3 Mg/DL) in November 2024 , which could be due to initiation of HCTZ but this normalized by December, 2024 -I did explain to the patient that HCTZ tends  to be protective towards the kidney but it tends to increase serum calcium.  As long as her serum calcium <11.5 mg/dL no intervention needed -Her PTH was inappropriately normal 10/2023 -She was provided with a printed order for KUB   Recommendations  - Encouraged hydration  - AVOID CALCIUM SUPPLEMENTS, AVOID LOW CALCIUM DIET - Maintain normal dietary calcium intake (2-3 servings of dairy a day)     F/U in 1 yr   I spent 25 minutes preparing to see the patient by review of recent labs, imaging and procedures, obtaining and reviewing separately obtained history, communicating with the patient, ordering medications, tests or procedures, and documenting clinical information in the EHR including the differential Dx, treatment, and any further evaluation and other management    Signed electronically by: Lyndle Herrlich, MD  [facility]  [facility] [address] [place], [place] [address] Phone: [phone] FAX: [phone]   CC: Sherlene Shams, MD [address] [place] [address] Phone: [phone] Fax: [phone]   Return to Endocrinology clinic as below: Future Appointments  Date Time Provider Department Center  02/02/2024  9:10 AM Margrit Minner, Konrad Dolores, MD [id number] None

## 2024-02-02 NOTE — Patient Instructions (Signed)
Stay Hydrated  Avoid over the counter calcium tablets Consume 2-3 servings of dietary calcium daily ( Low fat milk, yogurt, green leafy vegetables)

## 2024-04-19 ENCOUNTER — Other Ambulatory Visit: Payer: Self-pay

## 2024-04-19 ENCOUNTER — Other Ambulatory Visit: Payer: Self-pay | Admitting: Internal Medicine

## 2024-04-19 ENCOUNTER — Telehealth: Admitting: Internal Medicine

## 2024-04-19 ENCOUNTER — Encounter: Payer: Self-pay | Admitting: Internal Medicine

## 2024-04-19 VITALS — BP 127/84 | Ht 63.0 in | Wt 164.0 lb

## 2024-04-19 DIAGNOSIS — K625 Hemorrhage of anus and rectum: Secondary | ICD-10-CM

## 2024-04-19 DIAGNOSIS — I10 Essential (primary) hypertension: Secondary | ICD-10-CM | POA: Diagnosis not present

## 2024-04-19 DIAGNOSIS — K921 Melena: Secondary | ICD-10-CM | POA: Diagnosis not present

## 2024-04-19 DIAGNOSIS — D721 Eosinophilia, unspecified: Secondary | ICD-10-CM

## 2024-04-19 MED ORDER — METOPROLOL SUCCINATE ER 100 MG PO TB24
100.0000 mg | ORAL_TABLET | Freq: Every day | ORAL | 1 refills | Status: DC
  Filled 2024-04-19 – 2024-05-10 (×2): qty 90, 90d supply, fill #0
  Filled 2024-08-06: qty 90, 90d supply, fill #1

## 2024-04-19 MED ORDER — AMLODIPINE BESYLATE 10 MG PO TABS
10.0000 mg | ORAL_TABLET | Freq: Every day | ORAL | 1 refills | Status: AC
  Filled 2024-04-19 – 2024-05-10 (×2): qty 90, 90d supply, fill #0

## 2024-04-19 NOTE — Assessment & Plan Note (Signed)
 Occurring daily,  with unintentional weight loss of 20 lbs over the last 6 months.   Urgent referral to Gastroenterology in progress,  iron/cbc ordered

## 2024-04-19 NOTE — Progress Notes (Addendum)
 Virtual Visit via Caregility   Note   This format is felt to be most appropriate for this patient at this time.  All issues noted in this document were discussed and addressed.  No physical exam was performed (except for noted visual exam findings with Video Visits).   I connected with Carla Mckinney  on 04/26/24 at  5:00 PM EDT by a video enabled telemedicine application or telephone and verified that I am speaking with the correct person using two identifiers. Location patient: home Location provider: work or home office Persons participating in the virtual visit: patient, provider  I discussed the limitations, risks, security and privacy concerns of performing an evaluation and management service by telephone and the availability of in person appointments. I also discussed with the patient that there may be a patient responsible charge related to this service. The patient expressed understanding and agreed to proceed.  Reason for visit: HEMATOCHEZIA   HPI:  63 yr old female with history of normal colonoscopy in 2018, on daily asa 81 mg presents with one month history of occasional blood in stools which is now occurring daily,  with increased frequency of stools .  She has been having 5 to 7 stools daily some days,  formed but softer in consistency.  The blood is described as dark and 'slimy"  .  She denies rectal pain and abdominal cramping.  She has had increased fatigue and an unintentional weight loss of 20 since November.  2024    ROS: See pertinent positives and negatives per HPI.  Past Medical History:  Diagnosis Date   Hypertension    Rheumatic fever 1986   has had echo since then. no cardiac issues.    Past Surgical History:  Procedure Laterality Date   ABDOMINAL HYSTERECTOMY     AUGMENTATION MAMMAPLASTY Bilateral    breast lift   CARDIAC CATHETERIZATION  2002   normal, due to syncope and abnormal myowiew ([facility])   COLONOSCOPY WITH PROPOFOL  N/A 09/15/2017   Procedure: COLONOSCOPY  WITH PROPOFOL ;  Surgeon: Selena Daily, MD;  Location: [facility];  Service: Gastroenterology;  Laterality: N/A;   MASTOPEXY  2008   OOPHORECTOMY     secondary to scar tissue,  [facility]   RIGHT OOPHORECTOMY     TONSILLECTOMY     TUBAL LIGATION      Family History  Problem Relation Age of Onset   Heart disease Mother    Heart failure Mother    Hypertension Father    Hyperlipidemia Father    Heart disease Maternal Grandfather    Heart disease Paternal Grandmother    Stroke Paternal Grandmother 16   Cancer Paternal Grandfather        multiple myeloma   Breast cancer Neg Hx     SOCIAL HX:  reports that she has never smoked. She has never used smokeless tobacco. She reports current alcohol use of about 1.0 standard drink of alcohol per week. She reports that she does not use drugs.    Current Outpatient Medications:    acetaminophen (TYLENOL) 325 MG tablet, Take 650 mg by mouth every 6 (six) hours as needed., Disp: , Rfl:    aspirin 81 MG tablet, Take 81 mg by mouth daily., Disp: , Rfl:    cholecalciferol (VITAMIN D3) 25 MCG (1000 UNIT) tablet, Take 1,000 Units by mouth daily., Disp: , Rfl:    diltiazem  (CARDIZEM ) 30 MG tablet, Take 1 tablet (30 mg total) by mouth as needed (rapid heart rate)., Disp: 90  tablet, Rfl: 2   furosemide  (LASIX ) 20 MG tablet, Take 1 tablet (20 mg total) by mouth daily as needed., Disp: 90 tablet, Rfl: 1   amLODipine  (NORVASC ) 10 MG tablet, Take 1 tablet (10 mg total) by mouth daily., Disp: 90 tablet, Rfl: 1   hydrochlorothiazide  (HYDRODIURIL ) 25 MG tablet, Take 1 tablet (25 mg total) by mouth daily. (Patient not taking: Reported on 04/19/2024), Disp: 90 tablet, Rfl: 3   metoprolol  succinate (TOPROL -XL) 100 MG 24 hr tablet, Take 1 tablet (100 mg total) by mouth daily., Disp: 90 tablet, Rfl: 1  EXAM:  VITALS per patient if applicable:  GENERAL: alert, oriented, appears well and in no acute distress  HEENT: atraumatic, conjunttiva clear, no  obvious abnormalities on inspection of external nose and ears  NECK: normal movements of the head and neck  LUNGS: on inspection no signs of respiratory distress, breathing rate appears normal, no obvious gross SOB, gasping or wheezing  CV: no obvious cyanosis  MS: moves all visible extremities without noticeable abnormality  PSYCH/NEURO: pleasant and cooperative, no obvious depression or anxiety, speech and thought processing grossly intact  ASSESSMENT AND PLAN: Hematochezia Assessment & Plan: Occurring daily,  with unintentional weight loss of 20 lbs over the last 6 months.   Urgent referral to Gastroenterology in progress,  iron/cbc ordered   Orders: -     Ambulatory referral to Gastroenterology  Essential hypertension -     amLODIPine  Besylate; Take 1 tablet (10 mg total) by mouth daily.  Dispense: 90 tablet; Refill: 1  Essential hypertension, benign -     Metoprolol  Succinate ER; Take 1 tablet (100 mg total) by mouth daily.  Dispense: 90 tablet; Refill: 1  Eosinophilia, unspecified type Assessment & Plan: She did not return for follow up on eosinophilia first noted in November.   DDX includes neoplasm,  colonoscopy needed given new onset hematochezia and weight loss      Rectal bleeding      I discussed the assessment and treatment plan with the patient. The patient was provided an opportunity to ask questions and all were answered. The patient agreed with the plan and demonstrated an understanding of the instructions.   The patient was advised to call back or seek an in-person evaluation if the symptoms worsen or if the condition fails to improve as anticipated.   I spent 30 minutes dedicated to the care of this patient on the date of this encounter to include pre-visit review of his medical history,  Face-to-face time with the patient , and post visit ordering of testing and therapeutics.    Thersia Flax, MD

## 2024-04-19 NOTE — Assessment & Plan Note (Signed)
 She did not return for follow up on eosinophilia first noted in November.   DDX includes neoplasm,  colonoscopy needed given new onset hematochezia and weight loss

## 2024-04-20 ENCOUNTER — Other Ambulatory Visit (INDEPENDENT_AMBULATORY_CARE_PROVIDER_SITE_OTHER)

## 2024-04-20 ENCOUNTER — Telehealth: Payer: Self-pay

## 2024-04-20 DIAGNOSIS — K921 Melena: Secondary | ICD-10-CM

## 2024-04-20 NOTE — Telephone Encounter (Signed)
 Patient returned call and is aware that she is scheduled to see Dr Yvone Herd on 04-28-24 at 3:40pm.  Patient was offered sooner dates (04-21-24, 04-22-24), but she was not available on those dates.  Patient agreed to plan and verbalized understanding.  No further questions.

## 2024-04-20 NOTE — Telephone Encounter (Signed)
 Left message for patient to return call to our office to schedule new patient consult.

## 2024-04-20 NOTE — Telephone Encounter (Signed)
-----   Message from Annis Kinder sent at 04/20/2024 11:48 AM EDT ----- I received the following message from this patient's PCP:  "good evening. I am referring this patient to you or any of your colleagues who can work her in on an urgent schedule  due to a one month history of hematochezia (not BRBPR) , occurring daily , and unintentional weight loss of 20 lbs since Nov..  she has had eosinophilia since then as well and did not return for workup  . last colonoscopy normal 2018 . my visit with her was virtual; labs are pending . thank you"  I am not in clinic the rest of the week and I am inpatient all of next week.  Are there any 7-day hold spots with one of the APP's or any of the other physicians to see tomorrow or next week?  Labs still not yet completed.  Thanks.

## 2024-04-21 LAB — IRON,TIBC AND FERRITIN PANEL
%SAT: 18 % (ref 16–45)
Ferritin: 95 ng/mL (ref 16–232)
Iron: 54 ug/dL (ref 45–160)
TIBC: 306 ug/dL (ref 250–450)

## 2024-04-21 LAB — COMPREHENSIVE METABOLIC PANEL WITH GFR
ALT: 20 U/L (ref 0–35)
AST: 33 U/L (ref 0–37)
Albumin: 4.1 g/dL (ref 3.5–5.2)
Alkaline Phosphatase: 97 U/L (ref 39–117)
BUN: 8 mg/dL (ref 6–23)
CO2: 28 meq/L (ref 19–32)
Calcium: 10.2 mg/dL (ref 8.4–10.5)
Chloride: 101 meq/L (ref 96–112)
Creatinine, Ser: 0.71 mg/dL (ref 0.40–1.20)
GFR: 92.44 mL/min (ref >60.00)
Glucose, Bld: 108 mg/dL — ABNORMAL HIGH (ref 70–99)
Potassium: 4.2 meq/L (ref 3.5–5.1)
Sodium: 136 meq/L (ref 135–145)
Total Bilirubin: 0.6 mg/dL (ref 0.2–1.2)
Total Protein: 6.8 g/dL (ref 6.0–8.3)

## 2024-04-21 LAB — PROTIME-INR
INR: 1 (ref 0.9–1.2)
Prothrombin Time: 11.5 s (ref 9.1–12.0)

## 2024-04-21 LAB — SPECIMEN STATUS REPORT

## 2024-04-21 LAB — APTT: aPTT: 28 s (ref 24–33)

## 2024-04-21 NOTE — Addendum Note (Signed)
 Addended by: Thressa Flora D on: 04/21/2024 11:01 AM   Modules accepted: Orders

## 2024-04-23 ENCOUNTER — Other Ambulatory Visit: Payer: Self-pay | Admitting: Internal Medicine

## 2024-04-23 DIAGNOSIS — K921 Melena: Secondary | ICD-10-CM

## 2024-04-26 ENCOUNTER — Other Ambulatory Visit (INDEPENDENT_AMBULATORY_CARE_PROVIDER_SITE_OTHER)

## 2024-04-26 ENCOUNTER — Encounter (HOSPITAL_COMMUNITY): Payer: Self-pay

## 2024-04-26 DIAGNOSIS — K921 Melena: Secondary | ICD-10-CM | POA: Diagnosis not present

## 2024-04-26 DIAGNOSIS — K625 Hemorrhage of anus and rectum: Secondary | ICD-10-CM | POA: Insufficient documentation

## 2024-04-27 LAB — CBC WITH DIFFERENTIAL/PLATELET
Basophils Absolute: 0 10*3/uL (ref 0.0–0.1)
Basophils Relative: 0.2 % (ref 0.0–3.0)
Eosinophils Absolute: 0.8 10*3/uL — ABNORMAL HIGH (ref 0.0–0.7)
Eosinophils Relative: 9.8 % — ABNORMAL HIGH (ref 0.0–5.0)
HCT: 39.3 % (ref 36.0–46.0)
Hemoglobin: 13.2 g/dL (ref 12.0–15.0)
Lymphocytes Relative: 24.2 % (ref 12.0–46.0)
Lymphs Abs: 2 10*3/uL (ref 0.7–4.0)
MCHC: 33.5 g/dL (ref 30.0–36.0)
MCV: 93.3 fl (ref 78.0–100.0)
Monocytes Absolute: 0.9 10*3/uL (ref 0.1–1.0)
Monocytes Relative: 10.9 % (ref 3.0–12.0)
Neutro Abs: 4.5 10*3/uL (ref 1.4–7.7)
Neutrophils Relative %: 54.9 % (ref 43.0–77.0)
Platelets: 331 10*3/uL (ref 150.0–400.0)
RBC: 4.21 Mil/uL (ref 3.87–5.11)
RDW: 13 % (ref 11.5–15.5)
WBC: 8.2 10*3/uL (ref 4.0–10.5)

## 2024-04-27 NOTE — Progress Notes (Unsigned)
 [facility] Initial Consultation   Referring Provider Thersia Flax, MD [address] [place],  [place] [address]  Primary Care Provider Thersia Flax, MD  Patient Profile: Carla Mckinney is a 63 y.o. female who is seen in consultation in the [facility] at the request of Dr. Madelon Scheuermann for evaluation and management of the problem(s) noted below.  Problem List: Hematochezia Unintentional weight loss Heartburn    History of Present Illness   Carla Mckinney is a 63 y.o. female with a history of HTN, PSVT, primary hyperparathyroidism, HLD, hypercalcemia and headache.  Patient was referred to our office by her PCP for a 1 month history of hematochezia occurring daily, and unintentional weight loss of 20 pounds since November. Noted to have eosinophilia since that time as well.  Labs on 04/20/2024 - Normal CMP, iron panel, PT/INR CBC on 04/26/2024 notable for eosinophilia, otherwise normal with hemoglobin of 13.2, MCV 93.3, platelets 331, no leukocytosis  Patient is here today with her husband. She states that, since February, she has had 20 pounds unintentional weight loss, fatigue, decreased appetite, and blood in her stool.  She has noticed blood in her stool daily, though not with every bowel movement, and it often appear stringy.  Blood is usually on the stool itself and not on the toilet paper.  Prior to this year, she would have a bowel movement once or twice a day with formed stools.  Over the last couple months she has had a change in her bowel habits and now has multiple bowel movements daily, up to 7 times a day, and sometimes feels that she is not completely emptying.  Her stools have become progressively darker, and the blood in her stool has become darker as well.  She denies diarrhea or black stools.    She thinks she may have hemorrhoids which she has felt when wiping, but denies any rectal pain.  She denies abdominal pain or cramping.     Patient states she has heartburn daily, and takes OTC Pepcid  as needed for this.  She feels that her heartburn has worsened lately.  Denies prior EGD.  Denies acid reflux.  Heartburn is not waking her up at night.  Denies dysphagia, nausea, vomiting.  She experiences early satiety but does not have any pain with eating.  Reports that she had recently been trying to eat healthier, but then realized she really was not eating much at all.  She has never experienced unintentional weight loss like this.  She has a history of headaches for which she used to take ibuprofen almost daily.  She stopped taking this in December at the recommendation of her PCP, and switched to Tylenol.  She denies any history of stomach ulcers or prior diagnosis of H. pylori infection.  She has family history of colon polyps but denies any family history of colon or stomach cancer, or IBD.  States her sister has IBS.  Reports a history of atrial fibrillation and remote history of rheumatic fever.  She is on diltiazem  and metoprolol .  She denies any problems with this recently and does not follow with cardiology. Denies MI/stroke history.   Last colonoscopy: 08/2017-normal Last endoscopy: None  Last Abd CT/CTE/MRE:  None  GI Review of Symptoms Significant for hematochezia, change in bowel habits, early satiety, decreased appetite, unintentional weight loss, heartburn, otherwise negative.  General Review of Systems  Review of systems is significant for the pertinent positives and negatives as listed per the HPI.  Full ROS is otherwise negative.  Past Medical History   Past Medical History:  Diagnosis Date   Hypertension    Rheumatic fever 1986   has had echo since then. no cardiac issues.    Past Surgical History   Past Surgical History:  Procedure Laterality Date   ABDOMINAL HYSTERECTOMY     AUGMENTATION MAMMAPLASTY Bilateral    breast lift   CARDIAC CATHETERIZATION  2002   normal, due to syncope and  abnormal myowiew ([place])   COLONOSCOPY WITH PROPOFOL  N/A 09/15/2017   Procedure: COLONOSCOPY WITH PROPOFOL ;  Surgeon: Selena Daily, MD;  Location: [facility];  Service: Gastroenterology;  Laterality: N/A;   MASTOPEXY  2008   OOPHORECTOMY     secondary to scar tissue,  [facility]   RIGHT OOPHORECTOMY     TONSILLECTOMY     TUBAL LIGATION      Allergies and Medications   Allergies  Allergen Reactions   Lisinopril  Swelling     Current Outpatient Medications:    acetaminophen (TYLENOL) 325 MG tablet, Take 650 mg by mouth every 6 (six) hours as needed., Disp: , Rfl:    amLODipine  (NORVASC ) 10 MG tablet, Take 1 tablet (10 mg total) by mouth daily., Disp: 90 tablet, Rfl: 1   aspirin 81 MG tablet, Take 81 mg by mouth daily., Disp: , Rfl:    cholecalciferol (VITAMIN D3) 25 MCG (1000 UNIT) tablet, Take 1,000 Units by mouth daily., Disp: , Rfl:    diltiazem  (CARDIZEM ) 30 MG tablet, Take 1 tablet (30 mg total) by mouth as needed (rapid heart rate)., Disp: 90 tablet, Rfl: 2   furosemide  (LASIX ) 20 MG tablet, Take 1 tablet (20 mg total) by mouth daily as needed., Disp: 90 tablet, Rfl: 1   metoprolol  succinate (TOPROL -XL) 100 MG 24 hr tablet, Take 1 tablet (100 mg total) by mouth daily., Disp: 90 tablet, Rfl: 1   Na Sulfate-K Sulfate-Mg Sulfate concentrate (SUPREP) 17.5-3.13-1.6 GM/177ML SOLN, Take 1 kit (354 mLs total) by mouth once for 1 dose., Disp: 354 mL, Rfl: 0   Family History   Family History  Problem Relation Age of Onset   Heart disease Mother    Heart failure Mother    Hypertension Father    Hyperlipidemia Father    Heart disease Maternal Grandfather    Heart disease Paternal Grandmother    Stroke Paternal Grandmother 7   Cancer Paternal Grandfather        multiple myeloma   Breast cancer Neg Hx     GI Specific Family History: Colon polyps  Social History   Social History   Tobacco Use   Smoking status: Never   Smokeless tobacco: Never  Vaping Use   Vaping  status: Never Used  Substance Use Topics   Alcohol use: Yes    Alcohol/week: 1.0 standard drink of alcohol    Types: 1 Glasses of wine per week    Comment: socially   Drug use: No     Vital Signs and Physical Examination  BP 122/78   Pulse 65   Ht 5\' 3"  (1.6 m)   Wt 167 lb (75.8 kg)   BMI 29.58 kg/m    General: Well developed, well nourished, no acute distress Head: Normocephalic and atraumatic Eyes: Sclerae anicteric, EOMI Ears: Normal auditory acuity Mouth: No deformities or lesions noted Lungs: Clear throughout to auscultation Heart: Regular rate and rhythm; No murmurs, rubs or bruits Abdomen: Soft, non tender and non distended. No masses, hepatosplenomegaly or hernias noted. Normal  Bowel sounds Rectal: Deferred to colonoscopy. Musculoskeletal: Symmetrical with no gross deformities  Pulses:  Normal pulses noted Extremities: No edema or deformities noted Neurological: Alert oriented x 4, grossly nonfocal Psychological:  Alert and cooperative. Normal mood and affect  Review of Data  The following data was reviewed at the time of this encounter:  Laboratory Studies      Latest Ref Rng & Units 04/26/2024    3:31 PM 11/30/2023    2:10 PM 11/16/2023   12:07 PM  CBC  WBC 4.0 - 10.5 K/uL 8.2  9.2  7.8   Hemoglobin 12.0 - 15.0 g/dL 16.1  09.6  04.5   Hematocrit 36.0 - 46.0 % 39.3  39.1  41.3   Platelets 150.0 - 400.0 K/uL 331.0  327.0  332.0     No results found for: "LIPASE"    Latest Ref Rng & Units 04/20/2024    2:34 PM 11/30/2023    2:10 PM 11/16/2023   12:07 PM  CMP  Glucose 70 - 99 mg/dL 409  811  87   BUN 6 - 23 mg/dL 8  14  10    Creatinine 0.40 - 1.20 mg/dL 9.14  7.82  9.56   Sodium 135 - 145 mEq/L 136  137  138   Potassium 3.5 - 5.1 mEq/L 4.2  3.9  4.5   Chloride 96 - 112 mEq/L 101  101  103   CO2 19 - 32 mEq/L 28  28  28    Calcium 8.4 - 10.5 mg/dL 21.3  08.6  57.8    46.9   Total Protein 6.0 - 8.3 g/dL 6.8   7.0   Total Bilirubin 0.2 - 1.2 mg/dL 0.6    0.7   Alkaline Phos 39 - 117 U/L 97   123   AST 0 - 37 U/L 33   26   ALT 0 - 35 U/L 20   22     Imaging Studies   Echocardiogram 01/04/2014 - LVEF 65 to 70%   GI Procedures and Studies  Colonoscopy 09/15/2017 [facility], Dr. Baldomero Bone) - Entire colon and terminal ileum normal  Clinical Impression  It is my clinical impression that Ms. Dohrman is a 63 y.o. female with;  Hematochezia Change in bowel habits Unintentional weight loss Heartburn Early satiety/decreased appetite  Plan  Will plan for EGD/Colonoscopy. I thoroughly discussed the procedure with the patient to include nature of the procedure, alternatives, benefits, and risks (including but not limited to bleeding, infection, perforation, anesthesia/cardiac/pulmonary complications). Patient verbalized understanding and gave verbal consent to proceed with procedure.  Order CT Abdomen/Pelvis for evaluation of unintentional weight loss  Planned Follow Up No follow-ups on file.  The patient or caregiver verbalized understanding of the material covered, with no barriers to understanding. All questions were answered. Patient or caregiver is agreeable with the plan outlined above.    It was a pleasure to see Deborrah.  If you have any questions or concerns regarding this evaluation, do not hesitate to contact me.  Eugenia Hess, MD [facility]

## 2024-04-28 ENCOUNTER — Encounter: Payer: Self-pay | Admitting: Pediatrics

## 2024-04-28 ENCOUNTER — Other Ambulatory Visit: Payer: Self-pay

## 2024-04-28 ENCOUNTER — Ambulatory Visit (INDEPENDENT_AMBULATORY_CARE_PROVIDER_SITE_OTHER): Admitting: Pediatrics

## 2024-04-28 ENCOUNTER — Encounter: Payer: Self-pay | Admitting: Internal Medicine

## 2024-04-28 VITALS — BP 122/78 | HR 65 | Ht 63.0 in | Wt 167.0 lb

## 2024-04-28 DIAGNOSIS — R12 Heartburn: Secondary | ICD-10-CM | POA: Diagnosis not present

## 2024-04-28 DIAGNOSIS — R6881 Early satiety: Secondary | ICD-10-CM | POA: Diagnosis not present

## 2024-04-28 DIAGNOSIS — K921 Melena: Secondary | ICD-10-CM

## 2024-04-28 DIAGNOSIS — R634 Abnormal weight loss: Secondary | ICD-10-CM | POA: Diagnosis not present

## 2024-04-28 DIAGNOSIS — R194 Change in bowel habit: Secondary | ICD-10-CM | POA: Diagnosis not present

## 2024-04-28 MED ORDER — NA SULFATE-K SULFATE-MG SULF 17.5-3.13-1.6 GM/177ML PO SOLN
1.0000 | Freq: Once | ORAL | 0 refills | Status: AC
  Filled 2024-04-28: qty 354, 1d supply, fill #0

## 2024-04-28 NOTE — Telephone Encounter (Signed)
 Fyi pt scheduled to be seen today

## 2024-04-28 NOTE — Patient Instructions (Signed)
 You have been scheduled for a CT scan of the abdomen and pelvis at [facility], 1st floor Radiology. You are scheduled on 05/09/24 at 2:00 pm, arriving at 11:45 am.   Please follow the written instructions below on the day of your exam:   1) Do not eat anything after 10:00am (4 hours prior to your test)    You may take any medications as prescribed with a small amount of water , if necessary. If you take any of the following medications: METFORMIN, GLUCOPHAGE, GLUCOVANCE, AVANDAMET, RIOMET, FORTAMET, ACTOPLUS MET, JANUMET, GLUMETZA or METAGLIP, you MAY be asked to HOLD this medication 48 hours AFTER the exam.   The purpose of you drinking the oral contrast is to aid in the visualization of your intestinal tract. The contrast solution may cause some diarrhea. Depending on your individual set of symptoms, you may also receive an intravenous injection of x-ray contrast/dye. Plan on being at [facility] for 45 minutes or longer, depending on the type of exam you are having performed.   If you have any questions regarding your exam or if you need to reschedule, you may call [facility] at [phone] between the hours of 8:00 am and 5:00 pm, Monday-Friday.   You have been scheduled for an endoscopy and colonoscopy. Please follow the written instructions given to you at your visit today.  If you use inhalers (even only as needed), please bring them with you on the day of your procedure.  DO NOT TAKE 7 DAYS PRIOR TO TEST- Trulicity (dulaglutide) Ozempic , Wegovy  (semaglutide ) Mounjaro (tirzepatide) Bydureon Bcise (exanatide extended release)  DO NOT TAKE 1 DAY PRIOR TO YOUR TEST Rybelsus  (semaglutide ) Adlyxin (lixisenatide) Victoza  (liraglutide ) Byetta (exanatide) ________________________________________________________________  _______________________________________________________  If your blood pressure at your visit was 140/90 or greater, please contact your primary  care physician to follow up on this.  _______________________________________________________  If you are age 32 or older, your body mass index should be between 23-30. Your Body mass index is 29.58 kg/m. If this is out of the aforementioned range listed, please consider follow up with your Primary Care Provider.  If you are age 55 or younger, your body mass index should be between 19-25. Your Body mass index is 29.58 kg/m. If this is out of the aformentioned range listed, please consider follow up with your Primary Care Provider.   ________________________________________________________  The [facility] providers would like to encourage you to use MYCHART to communicate with providers for non-urgent requests or questions.  Due to long hold times on the telephone, sending your provider a message by [facility] may be a faster and more efficient way to get a response.  Please allow 48 business hours for a response.  Please remember that this is for non-urgent requests.  _______________________________________________________

## 2024-04-29 ENCOUNTER — Encounter: Payer: Self-pay | Admitting: Pediatrics

## 2024-05-03 ENCOUNTER — Emergency Department

## 2024-05-03 ENCOUNTER — Emergency Department
Admission: EM | Admit: 2024-05-03 | Discharge: 2024-05-03 | Disposition: A | Discharge status: Home / Self Care | Attending: Emergency Medicine | Admitting: Emergency Medicine

## 2024-05-03 ENCOUNTER — Telehealth: Payer: Self-pay | Admitting: Pediatrics

## 2024-05-03 ENCOUNTER — Other Ambulatory Visit: Payer: Self-pay

## 2024-05-03 ENCOUNTER — Other Ambulatory Visit: Payer: 59

## 2024-05-03 ENCOUNTER — Ambulatory Visit: Admission: EM | Admit: 2024-05-03 | Discharge: 2024-05-03 | Disposition: A | Discharge status: Home / Self Care

## 2024-05-03 DIAGNOSIS — R1011 Right upper quadrant pain: Secondary | ICD-10-CM | POA: Diagnosis not present

## 2024-05-03 DIAGNOSIS — R160 Hepatomegaly, not elsewhere classified: Secondary | ICD-10-CM | POA: Insufficient documentation

## 2024-05-03 DIAGNOSIS — K921 Melena: Secondary | ICD-10-CM | POA: Insufficient documentation

## 2024-05-03 DIAGNOSIS — I10 Essential (primary) hypertension: Secondary | ICD-10-CM | POA: Insufficient documentation

## 2024-05-03 DIAGNOSIS — R109 Unspecified abdominal pain: Secondary | ICD-10-CM | POA: Diagnosis not present

## 2024-05-03 LAB — COMPREHENSIVE METABOLIC PANEL WITH GFR
ALT: 24 U/L (ref 0–44)
AST: 37 U/L (ref 15–41)
Albumin: 4.2 g/dL (ref 3.5–5.0)
Alkaline Phosphatase: 103 U/L (ref 38–126)
Anion gap: 9 (ref 5–15)
BUN: 10 mg/dL (ref 6–20)
CO2: 24 mmol/L (ref 22–32)
Calcium: 10.7 mg/dL — ABNORMAL HIGH (ref 8.9–10.3)
Chloride: 103 mmol/L (ref 98–111)
Creatinine, Ser: 0.85 mg/dL (ref 0.44–1.00)
GFR, Estimated: >60 mL/min (ref >60)
Glucose, Bld: 104 mg/dL — ABNORMAL HIGH (ref 70–99)
Potassium: 4.3 mmol/L (ref 3.5–5.1)
Sodium: 136 mmol/L (ref 135–145)
Total Bilirubin: 1.4 mg/dL — ABNORMAL HIGH (ref 0.0–1.2)
Total Protein: 7.6 g/dL (ref 6.5–8.1)

## 2024-05-03 LAB — URINALYSIS, ROUTINE W REFLEX MICROSCOPIC
Bacteria, UA: NONE SEEN
Bilirubin Urine: NEGATIVE
Glucose, UA: NEGATIVE mg/dL
Hgb urine dipstick: NEGATIVE
Ketones, ur: 20 mg/dL — AB
Nitrite: NEGATIVE
Protein, ur: NEGATIVE mg/dL
Specific Gravity, Urine: >1.046 — ABNORMAL HIGH (ref 1.005–1.030)
pH: 6 (ref 5.0–8.0)

## 2024-05-03 LAB — CBC
HCT: 41.4 % (ref 36.0–46.0)
Hemoglobin: 13.8 g/dL (ref 12.0–15.0)
MCH: 30.9 pg (ref 26.0–34.0)
MCHC: 33.3 g/dL (ref 30.0–36.0)
MCV: 92.8 fL (ref 80.0–100.0)
Platelets: 348 10*3/uL (ref 150–400)
RBC: 4.46 MIL/uL (ref 3.87–5.11)
RDW: 12.7 % (ref 11.5–15.5)
WBC: 9.4 10*3/uL (ref 4.0–10.5)
nRBC: 0 % (ref 0.0–0.2)

## 2024-05-03 LAB — SAMPLE TO BLOOD BANK

## 2024-05-03 LAB — LIPASE, BLOOD: Lipase: 29 U/L (ref 11–51)

## 2024-05-03 MED ORDER — IOHEXOL 300 MG/ML  SOLN
100.0000 mL | Freq: Once | INTRAMUSCULAR | Status: AC | PRN
  Administered 2024-05-03: 100 mL via INTRAVENOUS

## 2024-05-03 NOTE — Telephone Encounter (Signed)
 Patient called and stated that she was having server right side abdominal pain to the point she had a fever last night. Patient stated that she is very concerned. Patient is requesting a call back. Please advise.

## 2024-05-03 NOTE — ED Triage Notes (Signed)
 Pt presents with midepigastric pain that has now gone to R sided abdominal pain. Temp of 100.6 last night.  Saw gastro on 05/09 for bloody stools but didn't have any abd pain until last night.   Denies tenderness to palpation. Pain is not radiating. No n/v.  Still having blood in stools but no BM this morning.   No meds this morning. No food, only a few sips of water .

## 2024-05-03 NOTE — ED Triage Notes (Signed)
 Patient is being discharged from the Urgent Care and sent to the Emergency Department via POV . Per A White NP, patient is in need of higher level of care due to need for imaging/labs. Patient is aware and verbalizes understanding of plan of care.  Vitals:   05/03/24 1116  BP: 132/88  Pulse: 74  Resp: 20  Temp: 99.5 F (37.5 C)  SpO2: 97%

## 2024-05-03 NOTE — Telephone Encounter (Signed)
 Pt stated that she started having aching abdominal pain on her right mid abdomen yesterday afternoon. Pt stated that she did have a fever on 100.6 yesterday evening and took tylenol. No fever this AM. Pt stated that it is hard for the pt to breath in, hurts worse with laying on the left side, pain is still there even when sitting still, last BM was yesterday (formed), Pt stated that she is noticing bubbles in her Urine. Chart reviewed and noted recent office visit, CT has been scheduled along with EGD/Colon. Pt stated that she has an Urgent Care visit now at 11:00 AM that she scheduled. Pt was notified that I would follow up with her this afternoon in regard to evaluation and recommendations through Urgent Care.  Please review and advise

## 2024-05-03 NOTE — Discharge Instructions (Addendum)
 Please return if you have fever above 100.4 F, nausea, vomiting, abdominal pain, if patient is altered or confused, if she looks jaundiced or yellow.  Or you if you had any additional concerns.  You should get a call from oncology in 2 to 3 days to schedule an appointment.  If you do not hear back from them, please call Dr. Denese Finn office to see when the follow-up appointment is scheduled for you.  Please also make sure to follow-up with a gastroenterologist for your colonoscopy and endoscopy.

## 2024-05-03 NOTE — ED Triage Notes (Signed)
 Pt comes in via pov with complaints of abdominal pain that started yesterday. Pt sates that she had a fever yesterday of 100.6, and has blood in her stool. Pt has been seen by her primary care for the blood in her stool, and has an upcoming appt with the GI doctor. Pt is alert and oriented x4 with no signs of acute distress at this time.

## 2024-05-03 NOTE — ED Provider Notes (Signed)
 [facility] Provider Note    Event Date/Time   First MD Initiated Contact with Patient 05/03/24 1218     (approximate)   History   Abdominal Pain   HPI  Carla Mckinney is a 63 y.o. female with history of hyperlipidemia, hypertension, presenting with right-sided abdominal pain.  Also notes blood in her stool for the past month.  Was seen by gastroenterology on May 9 for weight loss as well as hematochezia.  Has noted fatigue and decreased appetite.  States that her stools are soft but not loose like diarrhea, does note intermittent bloody mucosa mixed in with her stool daily, states that this does not occur with every bowel movement, but she does see the blood daily.  She denies any nausea, vomiting, diarrhea, urinary symptoms, back pain, chest pain, shortness of breath.  Did state that her urine was more foul-smelling lately.  Did have a temperature of 100.6 yesterday.  Did not take any Tylenol ibuprofen.  She has prior history of a hysterectomy and tubal ligation.    On independent chart review, she was seen on May 9 by GI, they schedule her for an outpatient EGD, colonoscopy as well as CT abdomen pelvis.     Physical Exam   Triage Vital Signs: ED Triage Vitals  Encounter Vitals Group     BP 05/03/24 1201 (!) 135/91     Systolic BP Percentile --      Diastolic BP Percentile --      Pulse Rate 05/03/24 1201 78     Resp 05/03/24 1201 18     Temp 05/03/24 1201 99 F (37.2 C)     Temp src --      SpO2 05/03/24 1201 98 %     Weight 05/03/24 1135 161 lb (73 kg)     Height 05/03/24 1135 5\' 3"  (1.6 m)     Head Circumference --      Peak Flow --      Pain Score 05/03/24 1134 7     Pain Loc --      Pain Education --      Exclude from Growth Chart --     Most recent vital signs: Vitals:   05/03/24 1201  BP: (!) 135/91  Pulse: 78  Resp: 18  Temp: 99 F (37.2 C)  SpO2: 98%     General: Awake, no distress.  CV:  Good peripheral perfusion.   Resp:  Normal effort.  Abd:  No distention.  Soft, tender at the right upper quadrant without guarding Other:  No CVA tenderness bilaterally   ED Results / Procedures / Treatments   Labs (all labs ordered are listed, but only abnormal results are displayed) Labs Reviewed  COMPREHENSIVE METABOLIC PANEL WITH GFR - Abnormal; Notable for the following components:      Result Value   Glucose, Bld 104 (*)    Calcium 10.7 (*)    Total Bilirubin 1.4 (*)    All other components within normal limits  LIPASE, BLOOD  CBC  URINALYSIS, ROUTINE W REFLEX MICROSCOPIC  CEA  AFP TUMOR MARKER  CA 19-9 (SERIAL)  SAMPLE TO BLOOD BANK     RADIOLOGY On my independent interpretation, ultrasound, no obvious gallstones or evidence of cholecystitis, did note liver lesions.   PROCEDURES:  Critical Care performed: No  Procedures   MEDICATIONS ORDERED IN ED: Medications  iohexol (OMNIPAQUE) 300 MG/ML solution 100 mL (100 mLs Intravenous Contrast Given 05/03/24 1232)  IMPRESSION / MDM / ASSESSMENT AND PLAN / ED COURSE  I reviewed the triage vital signs and the nursing notes.                              Differential diagnosis includes, but is not limited to, biliary colic, cholecystitis, mass, peptic ulcer disease, reflux, colitis, diverticulitis, malignancy, UTI.  Will get labs, UA, CT abdomen pelvis.  Shared decision making done with patient and she is agreeable with this plan.  Patient's presentation is most consistent with acute presentation with potential threat to life or bodily function.  Independent interpretation of labs and imaging below.  On reassessment patient states that her pain is controlled, offered pain meds but she declined at this time.  Discussed with patient and husband about imaging results as well as the concern for malignancy, primary versus metastasis.  Had put a referral for oncology for outpatient follow-up, she will follow-up with her gastroenterologist as well  for her colonoscopy and endoscopy.  Considered but no indication for inpatient admission at this time, she is safe for outpatient management.  Had an extensive discussion with the patient and she is agreeable plan for outpatient follow-up.  Strict return precautions given to patient and husband.  Patient signed out pending UA, likely able to discharge after with outpatient follow-up.  The patient is on the cardiac monitor to evaluate for evidence of arrhythmia and/or significant heart rate changes.   Clinical Course as of 05/03/24 1513  Wed May 03, 2024  1225 On independent review, no leukocytosis, H&H stable, electrolytes really deranged, creatinine is normal, T. bili is mildly elevated, lipase is normal. [TT]  1319 CT ABDOMEN PELVIS W CONTRAST IMPRESSION: Large enhancing masses within the liver. Differential considerations would include metastases, primary hepatic malignancy or giant hemangiomas. Recommend further evaluation with MRI.   [TT]  1401 Discussed with her gastroenterologist Dr. Yvone Herd who wanted AFP, CEA and CA 19-9 prior to her leaving the ED so that oncology can review it.  Will add those labs on. [TT]  1416 US  ABDOMEN LIMITED RUQ (LIVER/GB) IMPRESSION: Multiple heterogeneous solid masses in the right hepatic lobe as seen on CT concerning for metastases or primary hepatocellular carcinoma. Recommend further evaluation with MRI.   [TT]  1508 Followed by GI - presented with RUQ pain and weight loss. UA pending. Pain well controlled. GI added on labs and oncology will follow up.  Pain well controlled.  Plan to follow up as outpatient.  [SM]    Clinical Course User Index [SM] Viviano Ground, MD [TT] Drenda Gentle Richard Champion, MD     FINAL CLINICAL IMPRESSION(S) / ED DIAGNOSES   Final diagnoses:  Right upper quadrant abdominal pain  Hematochezia  Liver masses     Rx / DC Orders   ED Discharge Orders          Ordered    Ambulatory referral to Hematology / Oncology        Comments: Your emergency department provider has referred you to see a hematology/oncology specialist. These are physicians who specialize in blood disorders and cancers, or findings concerning for cancer. You will receive a phone call from the [facility] to set up your appointment within 2 business days: [employer] operate Mon - Fri, 8:00 a.m. to 5:00 p.m.; closed for federally recognized holidays. Please be sure your phone is not set to block numbers during this time.   05/03/24 1320  Note:  This document was prepared using Dragon voice recognition software and may include unintentional dictation errors.    Shane Darling, MD 05/03/24 [phone]

## 2024-05-03 NOTE — ED Provider Notes (Signed)
 Care assumed of patient from outgoing provider.  See their note for initial history, exam and plan.  Clinical Course as of 05/03/24 1522  Wed May 03, 2024  1225 On independent review, no leukocytosis, H&H stable, electrolytes really deranged, creatinine is normal, T. bili is mildly elevated, lipase is normal. [TT]  1319 CT ABDOMEN PELVIS W CONTRAST IMPRESSION: Large enhancing masses within the liver. Differential considerations would include metastases, primary hepatic malignancy or giant hemangiomas. Recommend further evaluation with MRI.   [TT]  1401 Discussed with her gastroenterologist Dr. Yvone Herd who wanted AFP, CEA and CA 19-9 prior to her leaving the ED so that oncology can review it.  Will add those labs on. [TT]  1416 US  ABDOMEN LIMITED RUQ (LIVER/GB) IMPRESSION: Multiple heterogeneous solid masses in the right hepatic lobe as seen on CT concerning for metastases or primary hepatocellular carcinoma. Recommend further evaluation with MRI.   [TT]  1508 Followed by GI - presented with RUQ pain and weight loss. UA pending. Pain well controlled. GI added on labs and oncology will follow up.  Pain well controlled.  Plan to follow up as outpatient.  [SM]    Clinical Course User Index [SM] Viviano Ground, MD [TT] Shane Darling, MD  UA with no signs of an infectious process.  Discharged in stable condition.   Viviano Ground, MD 05/03/24 [phone]

## 2024-05-03 NOTE — ED Provider Notes (Signed)
 Patient presents for evaluation of right upper quadrant abdominal pain beginning yesterday evening, has been constant.  Initially beginning to the mid epigastric region but has now become localized to the right quadrant.  Noted fever being at 100.6 overnight.  Evaluated on 04/28/2024 for bloody stool by gastroenterology, still present, last occurrence yesterday evening.  Has taken Tylenol, no medication this morning.   Tenderness to the right upper quadrant, being sent to emergency department by personal vehicle as vital signs are stable   Reena Canning, NP 05/03/24 1120

## 2024-05-04 ENCOUNTER — Ambulatory Visit: Payer: Self-pay

## 2024-05-04 ENCOUNTER — Ambulatory Visit
Admission: RE | Admit: 2024-05-04 | Discharge: 2024-05-04 | Disposition: A | Discharge status: Home / Self Care | Source: Ambulatory Visit | Attending: Oncology | Admitting: Oncology

## 2024-05-04 ENCOUNTER — Other Ambulatory Visit: Payer: Self-pay

## 2024-05-04 ENCOUNTER — Telehealth: Payer: Self-pay

## 2024-05-04 DIAGNOSIS — R160 Hepatomegaly, not elsewhere classified: Secondary | ICD-10-CM | POA: Diagnosis not present

## 2024-05-04 DIAGNOSIS — R1900 Intra-abdominal and pelvic swelling, mass and lump, unspecified site: Secondary | ICD-10-CM | POA: Diagnosis not present

## 2024-05-04 DIAGNOSIS — K769 Liver disease, unspecified: Secondary | ICD-10-CM | POA: Insufficient documentation

## 2024-05-04 DIAGNOSIS — R590 Localized enlarged lymph nodes: Secondary | ICD-10-CM | POA: Diagnosis not present

## 2024-05-04 LAB — CEA: CEA: 305 ng/mL — ABNORMAL HIGH (ref 0.0–4.7)

## 2024-05-04 LAB — CA 19-9 (SERIAL): CA 19-9: 176 U/mL — ABNORMAL HIGH (ref 0–35)

## 2024-05-04 LAB — AFP TUMOR MARKER: AFP, Serum, Tumor Marker: 5.4 ng/mL (ref 0.0–9.2)

## 2024-05-04 MED ORDER — GADOBUTROL 1 MMOL/ML IV SOLN
7.0000 mL | Freq: Once | INTRAVENOUS | Status: AC | PRN
  Administered 2024-05-04: 7 mL via INTRAVENOUS

## 2024-05-04 NOTE — Telephone Encounter (Signed)
 Spoke with [name]. Updated on need for abdominal MRI to better assess liver and liver biopsy. MRI has been scheduled for this evening at 7p. Made aware to arrive 30 minutes early and not to eat or drink for 4 hours prior. Reports she has not taken aspirin in 5 days. Liver biopsy being arranged. Appointment arranged with medical oncology, 5/16 at 1045.

## 2024-05-05 ENCOUNTER — Encounter: Payer: Self-pay | Admitting: Oncology

## 2024-05-05 ENCOUNTER — Other Ambulatory Visit: Payer: Self-pay

## 2024-05-05 ENCOUNTER — Inpatient Hospital Stay

## 2024-05-05 ENCOUNTER — Encounter: Payer: Self-pay | Admitting: Internal Medicine

## 2024-05-05 ENCOUNTER — Inpatient Hospital Stay: Attending: Oncology | Admitting: Oncology

## 2024-05-05 ENCOUNTER — Telehealth: Payer: Self-pay

## 2024-05-05 VITALS — BP 128/73 | HR 84 | Temp 100.0°F | Resp 19 | Ht 63.0 in | Wt 163.3 lb

## 2024-05-05 DIAGNOSIS — R933 Abnormal findings on diagnostic imaging of other parts of digestive tract: Secondary | ICD-10-CM | POA: Diagnosis not present

## 2024-05-05 DIAGNOSIS — R160 Hepatomegaly, not elsewhere classified: Secondary | ICD-10-CM | POA: Insufficient documentation

## 2024-05-05 DIAGNOSIS — D376 Neoplasm of uncertain behavior of liver, gallbladder and bile ducts: Secondary | ICD-10-CM

## 2024-05-05 DIAGNOSIS — R970 Elevated carcinoembryonic antigen [CEA]: Secondary | ICD-10-CM | POA: Insufficient documentation

## 2024-05-05 DIAGNOSIS — Z79891 Long term (current) use of opiate analgesic: Secondary | ICD-10-CM | POA: Insufficient documentation

## 2024-05-05 DIAGNOSIS — Z808 Family history of malignant neoplasm of other organs or systems: Secondary | ICD-10-CM | POA: Diagnosis not present

## 2024-05-05 DIAGNOSIS — R932 Abnormal findings on diagnostic imaging of liver and biliary tract: Secondary | ICD-10-CM

## 2024-05-05 DIAGNOSIS — R978 Other abnormal tumor markers: Secondary | ICD-10-CM | POA: Insufficient documentation

## 2024-05-05 DIAGNOSIS — G893 Neoplasm related pain (acute) (chronic): Secondary | ICD-10-CM | POA: Diagnosis not present

## 2024-05-05 MED ORDER — OXYCODONE HCL 5 MG PO TABS
5.0000 mg | ORAL_TABLET | ORAL | 0 refills | Status: AC | PRN
  Filled 2024-05-05: qty 120, 20d supply, fill #0

## 2024-05-05 NOTE — Telephone Encounter (Signed)
-----   Message from Scarlette Currier McGreal sent at 05/05/2024  1:01 PM EDT ----- Regarding: RE: colonoscopy Nurses-   Ms. Boehl is currently scheduled for EGD and colonoscopy in June 2025.  Recent imaging has disclosed concern for sigmoid colon mass and liver metastases.  Can you please review schedules to see the soonest possible date she could be moved up for procedures with me or another provider at the [facility]?  Thanks,  Haskell Linker ----- Message ----- From: Rochell Chroman, RN Sent: 05/05/2024  12:27 PM EDT To: Truddie Furrow, MD Subject: colonoscopy                                    Hello, we saw Mrs. Carbajal at the cancer center today for evaluation of her liver masses and elevated CEA. There appears to be a sigmoid colon mass on her CT. Do you have availability to move her colonoscopy up?  Thank you,  Kristi

## 2024-05-05 NOTE — Patient Instructions (Signed)
 Liver biopsy scheduled for May 11, 2024  Arrive at 1000  for 1100 appointment Come into the Heart and Vascular entrance at [facility]. This entrance is located in the front of the hospital. Do not eat or drink anything after midnight Take your regularly scheduled blood pressure, pain, or seizure medication You will need a driver for this procedure

## 2024-05-05 NOTE — Progress Notes (Signed)
 Hematology/Oncology Consult note [facility] Telephone:([phone] [phone]  Patient Care Team: Thersia Flax, MD as PCP - General (Internal Medicine) Marquita Situ Magali Schmitz, MD as Consulting Physician (General Surgery) Rochell Chroman, RN as Oncology Nurse Navigator Avonne Boettcher, MD as Consulting Physician (Oncology)   Name of the patient: Carla Mckinney  [id number]  08-05-1961    Reason for referral-multifocal liver masses   Referring physician-Dr. Drenda Gentle  Date of visit: 05/05/24   History of presenting illness-patient is a 63 year old female with a past medical history significant for hypercalcemia due to hyperparathyroidism, hyperlipidemia among other medical problems. She was noted to have bright red blood in her stools along with symptoms of unintentional weight loss which has been going on for the last 6 weeks.  She has lost about 20 pounds of weight.  Her last colonoscopy in 2018 was unremarkable.  Patient was seen by [facility] and colonoscopy is planned a month from now.  Patient underwent CT abdomen and pelvis with contrast on 05/03/2024 for symptoms of right upper quadrant abdominal pain which showed large enhancing masses in the liver measuring up to 12.5 cm in the right hepatic lobe and 8.5 cm in the posterior right hepatic dome.  No evidence of intra-abdominal adenopathy.  This was followed by MRI abdomen with and without contrast which was ordered by me on 05/04/2024 which showed extremely bulky heterogeneously hypoenhancing masses occupying the majority of the right lobe of the liver measuring 14.9 x 12.9 x 9.1 cm.  Occasional incidental benign cyst throughout the liver.  Pancreas, spleen and adrenal glands and kidney appeared normal.  Stomach and intra-abdominal lymph nodes appeared normal.  Rectosigmoid mass was noted along with mesenteric adenopathy concerning for primary origin of these metastases.  aFP was normal.  CEA was elevated at 305.   CA 19-9 elevated at 176.  Patient reports feeling poorly overall.  Appetite is poor and along with weight loss she has had right upper quadrant abdominal pain which makes it difficult to sleep on her left side.  ECOG PS- 1  Pain scale- 5   Review of systems- Review of Systems  Constitutional:  Positive for malaise/fatigue and weight loss. Negative for chills and fever.  HENT:  Negative for congestion, ear discharge and nosebleeds.   Eyes:  Negative for blurred vision.  Respiratory:  Negative for cough, hemoptysis, sputum production, shortness of breath and wheezing.   Cardiovascular:  Negative for chest pain, palpitations, orthopnea and claudication.  Gastrointestinal:  Positive for abdominal pain and blood in stool. Negative for constipation, diarrhea, heartburn, melena, nausea and vomiting.  Genitourinary:  Negative for dysuria, flank pain, frequency, hematuria and urgency.  Musculoskeletal:  Negative for back pain, joint pain and myalgias.  Skin:  Negative for rash.  Neurological:  Negative for dizziness, tingling, focal weakness, seizures, weakness and headaches.  Endo/Heme/Allergies:  Does not bruise/bleed easily.  Psychiatric/Behavioral:  Negative for depression and suicidal ideas. The patient does not have insomnia.     Allergies  Allergen Reactions   Lisinopril  Swelling    Patient Active Problem List   Diagnosis Date Noted   Rectal bleeding 04/26/2024   Hematochezia 04/19/2024   Eosinophilia 12/01/2023   Impaired fasting glucose 05/04/2022   Primary hyperparathyroidism (HCC) 02/23/2022   New daily persistent headache 02/20/2022   Hypercalcemia 02/20/2022   Skin neoplasm 04/30/2021   Paroxysmal supraventricular tachycardia (HCC) 06/28/2019   Snoring 11/22/2018   Special screening for malignant neoplasms, colon    Obesity 12/24/2016  Encounter for preventive health examination 05/19/2016   Essential hypertension 07/18/2014   Cyst of breast, left, benign solitary  03/23/2013   Hyperlipidemia, familial, high LDL 02/24/2013     Past Medical History:  Diagnosis Date   Hypertension    Rheumatic fever 1986   has had echo since then. no cardiac issues.     Past Surgical History:  Procedure Laterality Date   ABDOMINAL HYSTERECTOMY     AUGMENTATION MAMMAPLASTY Bilateral    breast lift   CARDIAC CATHETERIZATION  2002   normal, due to syncope and abnormal myowiew ([place])   COLONOSCOPY WITH PROPOFOL  N/A 09/15/2017   Procedure: COLONOSCOPY WITH PROPOFOL ;  Surgeon: Selena Daily, MD;  Location: [facility];  Service: Gastroenterology;  Laterality: N/A;   MASTOPEXY  2008   OOPHORECTOMY     secondary to scar tissue,  [facility]   RIGHT OOPHORECTOMY     TONSILLECTOMY     TUBAL LIGATION      Social History   Socioeconomic History   Marital status: Married    Spouse name: Not on file   Number of children: 2   Years of education: Not on file   Highest education level: Associate degree: academic program  Occupational History   Not on file  Tobacco Use   Smoking status: Never   Smokeless tobacco: Never  Vaping Use   Vaping status: Never Used  Substance and Sexual Activity   Alcohol use: Yes    Alcohol/week: 1.0 standard drink of alcohol    Types: 1 Glasses of wine per week    Comment: socially   Drug use: No   Sexual activity: Yes    Birth control/protection: None  Other Topics Concern   Not on file  Social History Narrative   Not on file   Social Drivers of Health   Financial Resource Strain: Low Risk  (11/16/2023)   Overall Financial Resource Strain (CARDIA)    Difficulty of Paying Living Expenses: Not hard at all  Food Insecurity: No Food Insecurity (05/05/2024)   Hunger Vital Sign    Worried About Running Out of Food in the Last Year: Never true    Ran Out of Food in the Last Year: Never true  Transportation Needs: No Transportation Needs (05/05/2024)   PRAPARE - Administrator, Civil Service (Medical): No     Lack of Transportation (Non-Medical): No  Physical Activity: Insufficiently Active (05/05/2024)   Exercise Vital Sign    Days of Exercise per Week: 2 days    Minutes of Exercise per Session: 20 min  Stress: No Stress Concern Present (11/16/2023)   [employer] of Occupational Health - Occupational Stress Questionnaire    Feeling of Stress : Not at all  Social Connections: Moderately Integrated (11/16/2023)   Social Connection and Isolation Panel [NHANES]    Frequency of Communication with Friends and Family: More than three times a week    Frequency of Social Gatherings with Friends and Family: Twice a week    Attends Religious Services: More than 4 times per year    Active Member of [employer] or Organizations: No    Attends Banker Meetings: Not on file    Marital Status: Married  Catering manager Violence: Not At Risk (05/05/2024)   Humiliation, Afraid, Rape, and Kick questionnaire    Fear of Current or Ex-Partner: No    Emotionally Abused: No    Physically Abused: No    Sexually Abused: No  Family History  Problem Relation Age of Onset   Heart disease Mother    Heart failure Mother    Hypertension Father    Hyperlipidemia Father    Heart disease Maternal Grandfather    Heart disease Paternal Grandmother    Stroke Paternal Grandmother 70   Cancer Paternal Grandfather        multiple myeloma   Breast cancer Neg Hx      Current Outpatient Medications:    oxyCODONE (OXY IR/ROXICODONE) 5 MG immediate release tablet, Take 1 tablet (5 mg total) by mouth every 4 (four) hours as needed for severe pain (pain score 7-10)., Disp: 120 tablet, Rfl: 0   acetaminophen (TYLENOL) 325 MG tablet, Take 650 mg by mouth every 6 (six) hours as needed., Disp: , Rfl:    amLODipine  (NORVASC ) 10 MG tablet, Take 1 tablet (10 mg total) by mouth daily., Disp: 90 tablet, Rfl: 1   aspirin 81 MG tablet, Take 81 mg by mouth daily., Disp: , Rfl:    cholecalciferol (VITAMIN D3) 25 MCG (1000  UNIT) tablet, Take 1,000 Units by mouth daily., Disp: , Rfl:    diltiazem  (CARDIZEM ) 30 MG tablet, Take 1 tablet (30 mg total) by mouth as needed (rapid heart rate)., Disp: 90 tablet, Rfl: 2   furosemide  (LASIX ) 20 MG tablet, Take 1 tablet (20 mg total) by mouth daily as needed., Disp: 90 tablet, Rfl: 1   metoprolol  succinate (TOPROL -XL) 100 MG 24 hr tablet, Take 1 tablet (100 mg total) by mouth daily., Disp: 90 tablet, Rfl: 1   Physical exam:  Vitals:   05/05/24 1103  BP: 128/73  Pulse: 84  Resp: 19  Temp: 100 F (37.8 C)  TempSrc: Tympanic  SpO2: 97%  Weight: 163 lb 4.8 oz (74.1 kg)  Height: 5\' 3"  (1.6 m)   Physical Exam Cardiovascular:     Rate and Rhythm: Normal rate and regular rhythm.     Heart sounds: Normal heart sounds.  Pulmonary:     Effort: Pulmonary effort is normal.     Breath sounds: Normal breath sounds.  Abdominal:     General: Bowel sounds are normal.     Palpations: Abdomen is soft.     Comments: There is palpable fullness in the right upper quadrant and palpable hepatomegaly right below the subcostal margin  Skin:    General: Skin is warm and dry.  Neurological:     Mental Status: She is alert and oriented to person, place, and time.           Latest Ref Rng & Units 05/03/2024   11:37 AM  CMP  Glucose 70 - 99 mg/dL 295   BUN 6 - 20 mg/dL 10   Creatinine 6.21 - 1.00 mg/dL 3.08   Sodium 657 - 846 mmol/L 136   Potassium 3.5 - 5.1 mmol/L 4.3   Chloride 98 - 111 mmol/L 103   CO2 22 - 32 mmol/L 24   Calcium 8.9 - 10.3 mg/dL 96.2   Total Protein 6.5 - 8.1 g/dL 7.6   Total Bilirubin 0.0 - 1.2 mg/dL 1.4   Alkaline Phos 38 - 126 U/L 103   AST 15 - 41 U/L 37   ALT 0 - 44 U/L 24       Latest Ref Rng & Units 05/03/2024   11:37 AM  CBC  WBC 4.0 - 10.5 K/uL 9.4   Hemoglobin 12.0 - 15.0 g/dL 95.2   Hematocrit 84.1 - 46.0 % 41.4   Platelets 150 -  400 K/uL 348     No images are attached to the encounter.  MR ABDOMEN W WO CONTRAST Result Date:  05/04/2024 CLINICAL DATA:  Liver masses EXAM: MRI ABDOMEN WITHOUT AND WITH CONTRAST TECHNIQUE: Multiplanar multisequence MR imaging of the abdomen was performed both before and after the administration of intravenous contrast. CONTRAST:  7mL GADAVIST GADOBUTROL 1 MMOL/ML IV SOLN COMPARISON:  CT abdomen pelvis, 03/03/2024 FINDINGS: Lower chest: No acute abnormality. Hepatobiliary: Extremely bulky heterogeneously hypoenhancing masses occupying the majority of the right lobe of the liver, dominant mass measuring 14.9 x 12.9 x 9.1 cm (series 16, image 45, series 3, image 17). Occasional incidental, benign cysts scattered throughout the liver. No gallstones, gallbladder wall thickening, or biliary dilatation. Pancreas: Unremarkable. No pancreatic ductal dilatation or surrounding inflammatory changes. Spleen: Normal in size without significant abnormality. Adrenals/Urinary Tract: Adrenal glands are unremarkable. Kidneys are normal, without renal calculi, solid lesion, or hydronephrosis. Stomach/Bowel: Stomach is within normal limits. No evidence of bowel wall thickening, distention, or inflammatory changes. Vascular/Lymphatic: No significant vascular findings are present. No enlarged abdominal lymph nodes. Other: No abdominal wall hernia or abnormality. No ascites. Musculoskeletal: No acute or significant osseous findings. IMPRESSION: 1. Extremely bulky heterogeneously hypoenhancing masses occupying the majority of the right lobe of the liver, dominant mass measuring 14.9 x 12.9 x 9.1 cm. Findings are consistent with hepatic metastatic disease. No metastases within the left lobe. In retrospective review of CT dated 05/03/2024, there is a rectosigmoid colon mass with associated mesenteric lymphadenopathy, which is presumably the primary origin of these metastases. 2. No other evidence of lymphadenopathy or metastatic disease in the abdomen. Electronically Signed   By: Fredricka Jenny M.D.   On: 05/04/2024 21:25   US   ABDOMEN LIMITED RUQ (LIVER/GB) Result Date: 05/03/2024 CLINICAL DATA:  Right upper quadrant pain EXAM: ULTRASOUND ABDOMEN LIMITED RIGHT UPPER QUADRANT COMPARISON:  CT earlier today FINDINGS: Gallbladder: No gallstones or wall thickening visualized. No sonographic Murphy sign noted by sonographer. Common bile duct: Diameter: Normal caliber, 3 mm Liver: Multiple heterogeneous solid masses, the largest measurable mass in the right hepatic lobe measuring up to 10.4 cm. These do not have a sonographic appearance of hemangiomas. Few scattered cysts, the largest 3.5 cm in the inferior right lobe. Portal vein is patent on color Doppler imaging with normal direction of blood flow towards the liver. Other: None. IMPRESSION: Multiple heterogeneous solid masses in the right hepatic lobe as seen on CT concerning for metastases or primary hepatocellular carcinoma. Recommend further evaluation with MRI. Electronically Signed   By: Janeece Mechanic M.D.   On: 05/03/2024 13:44   CT ABDOMEN PELVIS W CONTRAST Result Date: 05/03/2024 CLINICAL DATA:  Right abdominal pain EXAM: CT ABDOMEN AND PELVIS WITH CONTRAST TECHNIQUE: Multidetector CT imaging of the abdomen and pelvis was performed using the standard protocol following bolus administration of intravenous contrast. RADIATION DOSE REDUCTION: This exam was performed according to the departmental dose-optimization program which includes automated exposure control, adjustment of the mA and/or kV according to patient size and/or use of iterative reconstruction technique. CONTRAST:  100mL OMNIPAQUE IOHEXOL 300 MG/ML  SOLN COMPARISON:  None Available. FINDINGS: Lower chest: No acute abnormality Hepatobiliary: Large enhancing masses within the liver measuring up to 12.5 cm in the right hepatic lobe on image 24 and 8.5 cm in the posterior right hepatic dome on image 16. Scattered water  density small cysts throughout the liver. Gallbladder unremarkable. No biliary ductal dilatation. Pancreas:  No focal abnormality or ductal dilatation. Spleen: No focal abnormality.  Normal size. Adrenals/Urinary Tract: No adrenal abnormality. No focal renal abnormality. No stones or hydronephrosis. Urinary bladder is unremarkable. Stomach/Bowel: Normal appendix. Stomach, large and small bowel grossly unremarkable. Vascular/Lymphatic: No evidence of aneurysm or adenopathy. Reproductive: Prior hysterectomy.  No adnexal masses. Other: No free fluid or free air. Musculoskeletal: No acute bony abnormality or focal bone lesion. IMPRESSION: Large enhancing masses within the liver. Differential considerations would include metastases, primary hepatic malignancy or giant hemangiomas. Recommend further evaluation with MRI. Electronically Signed   By: Janeece Mechanic M.D.   On: 05/03/2024 13:09    Assessment and plan- Patient is a 63 y.o. female referred for multifocal liver masses and elevated CEA concerning for metastatic colorectal carcinoma  I have reviewed patient's CT abdomen and pelvis images as well as MRI images independently and discussed findings with the patient which shows bulky heterogeneous masses occupying almost the entire right lobe of the liver.  MRI also showed possible mass in the rectosigmoid thereby suggesting the possibility of colorectal primary.  aFP is normal.  CA 19-9 was mildly elevated but there is no abnormality noted in the pancreas on MRI.  CEA significantly elevated at 305.  We will plan to get ultrasound-guided liver biopsy which is going to be done next week.  We will also plan for PET CT scan following liver biopsy.  We are reaching out to [facility] to see if they can expedite the colonoscopy and get it done sooner instead of 4 weeks from now.  After biopsy confirms malignancy which is likely stage IV colon cancer patient will benefit from systemic treatment.  If she has MSI high disease I would favor immunotherapy combination for her over chemotherapy.  I will discuss this in more detail  once biopsy results are back.  We will plan for port placement on the day of liver biopsy as well.  I will discuss genetic testing at my next visit.  There is no significant family history of breast or colon cancer or melanoma.  Neoplasm related pain: I am starting the patient on as needed oxycodone 5 mg every 4 hours as needed.  Have also encouraged the patient to take MiraLAX along with Senokot to prevent opioid-induced constipation.  I will see the patient back after biopsy results are back to discuss further management.  Treatment will be given with palliative intent.  Patient understands and agrees to proceed as planned   Thank you for this kind referral and the opportunity to participate in the care of this  Patient   Visit Diagnosis 1. Liver mass   2. Elevated CEA   3. Neoplasm related pain     Dr. Seretha Dance, MD, MPH [facility] [phone] 05/05/2024

## 2024-05-05 NOTE — Telephone Encounter (Signed)
 Called and spoke with patient. EGD/colon appt has been moved to Tuesday, 05/09/24 at 1:30 pm with Dr. Venice Gillis. Patient already has her prep and knows to expect updated instructions via MyChart today. Patient had no concerns at the end of the call.  Secure message sent to pre-certification team since procedure date has been moved up.

## 2024-05-08 ENCOUNTER — Encounter: Payer: Self-pay | Admitting: Gastroenterology

## 2024-05-08 NOTE — Progress Notes (Signed)
 Gilmer Mor, DO sent to Markus Daft; P Ir Procedure Requests OK for US guided liver mass biopsy.  [address]

## 2024-05-09 ENCOUNTER — Ambulatory Visit (AMBULATORY_SURGERY_CENTER): Admitting: Gastroenterology

## 2024-05-09 ENCOUNTER — Ambulatory Visit (HOSPITAL_COMMUNITY)

## 2024-05-09 ENCOUNTER — Encounter: Payer: Self-pay | Admitting: Gastroenterology

## 2024-05-09 ENCOUNTER — Telehealth: Payer: Self-pay | Admitting: Gastroenterology

## 2024-05-09 VITALS — BP 112/73 | HR 83 | Temp 97.8°F | Resp 17 | Ht 63.0 in | Wt 167.0 lb

## 2024-05-09 DIAGNOSIS — I10 Essential (primary) hypertension: Secondary | ICD-10-CM | POA: Diagnosis not present

## 2024-05-09 DIAGNOSIS — K921 Melena: Secondary | ICD-10-CM

## 2024-05-09 DIAGNOSIS — K640 First degree hemorrhoids: Secondary | ICD-10-CM | POA: Diagnosis not present

## 2024-05-09 DIAGNOSIS — R194 Change in bowel habit: Secondary | ICD-10-CM

## 2024-05-09 DIAGNOSIS — K5710 Diverticulosis of small intestine without perforation or abscess without bleeding: Secondary | ICD-10-CM

## 2024-05-09 DIAGNOSIS — C19 Malignant neoplasm of rectosigmoid junction: Secondary | ICD-10-CM

## 2024-05-09 MED ORDER — SODIUM CHLORIDE 0.9 % IV SOLN: 500.0000 mL | Freq: Once | INTRAVENOUS | Status: DC

## 2024-05-09 NOTE — Patient Instructions (Addendum)
 Resume previous diet Continue present medications Await pathology results, sent [facility] Follow up with oncology See handouts on hemorrhoids  YOU HAD AN ENDOSCOPIC PROCEDURE TODAY AT THE [facility]:   Refer to the procedure report that was given to you for any specific questions about what was found during the examination.  If the procedure report does not answer your questions, please call your gastroenterologist to clarify.  If you requested that your care partner not be given the details of your procedure findings, then the procedure report has been included in a sealed envelope for you to review at your convenience later.  YOU SHOULD EXPECT: Some feelings of bloating in the abdomen. Passage of more gas than usual.  Walking can help get rid of the air that was put into your GI tract during the procedure and reduce the bloating. If you had a lower endoscopy (such as a colonoscopy or flexible sigmoidoscopy) you may notice spotting of blood in your stool or on the toilet paper. If you underwent a bowel prep for your procedure, you may not have a normal bowel movement for a few days.  Please Note:  You might notice some irritation and congestion in your nose or some drainage.  This is from the oxygen used during your procedure.  There is no need for concern and it should clear up in a day or so.  SYMPTOMS TO REPORT IMMEDIATELY:  Following lower endoscopy (colonoscopy or flexible sigmoidoscopy):  Excessive amounts of blood in the stool  Significant tenderness or worsening of abdominal pains  Swelling of the abdomen that is new, acute  Fever of 100F or higher  Following upper endoscopy (EGD)  Vomiting of blood or coffee ground material  New chest pain or pain under the shoulder blades  Painful or persistently difficult swallowing  New shortness of breath  Black, tarry-looking stools  For urgent or emergent issues, a gastroenterologist can be reached at any hour by calling ([phone]  [phone]. Do not use MyChart messaging for urgent concerns.   DIET:  We do recommend a small meal at first, but then you may proceed to your regular diet.  Drink plenty of fluids but you should avoid alcoholic beverages for 24 hours.  ACTIVITY:  You should plan to take it easy for the rest of today and you should NOT DRIVE or use heavy machinery until tomorrow (because of the sedation medicines used during the test).    FOLLOW UP: Our staff will call the number listed on your records the next business day following your procedure.  We will call around 7:15- 8:00 am to check on you and address any questions or concerns that you may have regarding the information given to you following your procedure. If we do not reach you, we will leave a message.     If any biopsies were taken you will be contacted by phone or by letter within the next 1-3 weeks.  Please call us  at [phone] if you have not heard about the biopsies in 3 weeks.   SIGNATURES/CONFIDENTIALITY: You and/or your care partner have signed paperwork which will be entered into your electronic medical record.  These signatures attest to the fact that that the information above on your After Visit Summary has been reviewed and is understood.  Full responsibility of the confidentiality of this discharge information lies with you and/or your care-partner.

## 2024-05-09 NOTE — Progress Notes (Signed)
 [facility] Initial Consultation    Referring Provider Thersia Flax, MD [address] [place],  [place] [address]   Primary Care Provider Thersia Flax, MD   Patient Profile: Carla Mckinney is a 63 y.o. female who is seen in consultation in the [facility] at the request of Dr. Madelon Scheuermann for evaluation and management of the problem(s) noted below.   Problem List: Hematochezia Unintentional weight loss Heartburn      History of Present Illness   Carla Mckinney is a 63 y.o. female with a history of HTN, PSVT, primary hyperparathyroidism, HLD, hypercalcemia and headache.   Patient was referred to our office by her PCP for a 1 month history of hematochezia occurring daily, and unintentional weight loss of 20 pounds since November. Noted to have eosinophilia since that time as well.   Labs on 04/20/2024 - Normal CMP, iron panel, PT/INR CBC on 04/26/2024 notable for eosinophilia, otherwise normal with hemoglobin of 13.2, MCV 93.3, platelets 331, no leukocytosis   Carla Mckinney is here today with her husband. She states that, since February 2025, she has had 20 pounds unintentional weight loss, fatigue, decreased appetite, and blood in her stool.  She has noticed blood in her stool daily, though not with every bowel movement, and it often appear stringy.  Blood is usually on the stool itself and not on the toilet paper.  Prior to this year, she would have a bowel movement once or twice a day with formed stools.  Over the last couple months she has had a change in her bowel habits and now has multiple bowel movements daily, up to 7 times a day, and sometimes feels that she is not completely emptying.  Her stools have become progressively darker, and the blood in her stool has become darker as well.  She denies diarrhea or black stools.     She thinks she may have hemorrhoids which she has felt when wiping, but denies any rectal pain.  She denies abdominal pain  or cramping.     Patient states she has heartburn daily, and takes OTC Pepcid  as needed for this.  She feels that her heartburn has worsened lately.  Denies prior EGD.  Denies acid reflux.  Heartburn is not waking her up at night.  Denies dysphagia, nausea, vomiting.   She experiences early satiety but does not have any pain with eating.  Reports that she had recently been trying to eat healthier, but then realized she really was not eating much at all.  She has never experienced unintentional weight loss like this.   She has a history of headaches for which she used to take ibuprofen almost daily.  She stopped taking this in December at the recommendation of her PCP, and switched to Tylenol.  She denies any history of stomach ulcers or prior diagnosis of H. pylori infection.   She has family history of colon polyps but denies any family history of colon or stomach cancer, or IBD.  States her sister has IBS.   Reports a history of atrial fibrillation and remote history of rheumatic fever.  She is on diltiazem  and metoprolol .  She denies any problems with this recently and does not follow with cardiology. Denies MI/stroke history.     Last colonoscopy: 08/2017-normal Last endoscopy: None   Last Abd CT/CTE/MRE:  None   GI Review of Symptoms Significant for hematochezia, change in bowel habits, early satiety, decreased appetite, unintentional weight loss, heartburn, otherwise negative.  General Review of Systems  Review of systems is significant for the pertinent positives and negatives as listed per the HPI.  Full ROS is otherwise negative.   Past Medical History        Past Medical History:  Diagnosis Date   Hypertension     Rheumatic fever 1986    has had echo since then. no cardiac issues.          Past Surgical History         Past Surgical History:  Procedure Laterality Date   ABDOMINAL HYSTERECTOMY       AUGMENTATION MAMMAPLASTY Bilateral      breast lift   CARDIAC  CATHETERIZATION   2002    normal, due to syncope and abnormal myowiew ([facility])   COLONOSCOPY WITH PROPOFOL  N/A 09/15/2017    Procedure: COLONOSCOPY WITH PROPOFOL ;  Surgeon: Selena Daily, MD;  Location: [facility];  Service: Gastroenterology;  Laterality: N/A;   MASTOPEXY   2008   OOPHORECTOMY        secondary to scar tissue,  [facility]   RIGHT OOPHORECTOMY       TONSILLECTOMY       TUBAL LIGATION              Allergies and Medications    Allergies      Allergies  Allergen Reactions   Lisinopril  Swelling       Current Medications    Current Outpatient Medications:    acetaminophen (TYLENOL) 325 MG tablet, Take 650 mg by mouth every 6 (six) hours as needed., Disp: , Rfl:    amLODipine  (NORVASC ) 10 MG tablet, Take 1 tablet (10 mg total) by mouth daily., Disp: 90 tablet, Rfl: 1   aspirin 81 MG tablet, Take 81 mg by mouth daily., Disp: , Rfl:    cholecalciferol (VITAMIN D3) 25 MCG (1000 UNIT) tablet, Take 1,000 Units by mouth daily., Disp: , Rfl:    diltiazem  (CARDIZEM ) 30 MG tablet, Take 1 tablet (30 mg total) by mouth as needed (rapid heart rate)., Disp: 90 tablet, Rfl: 2   furosemide  (LASIX ) 20 MG tablet, Take 1 tablet (20 mg total) by mouth daily as needed., Disp: 90 tablet, Rfl: 1   metoprolol  succinate (TOPROL -XL) 100 MG 24 hr tablet, Take 1 tablet (100 mg total) by mouth daily., Disp: 90 tablet, Rfl: 1   Na Sulfate-K Sulfate-Mg Sulfate concentrate (SUPREP) 17.5-3.13-1.6 GM/177ML SOLN, Take 1 kit (354 mLs total) by mouth once for 1 dose., Disp: 354 mL, Rfl: 0      Family History         Family History  Problem Relation Age of Onset   Heart disease Mother     Heart failure Mother     Hypertension Father     Hyperlipidemia Father     Heart disease Maternal Grandfather     Heart disease Paternal Grandmother     Stroke Paternal Grandmother 43   Cancer Paternal Grandfather          multiple myeloma   Breast cancer Neg Hx            GI Specific Family  History: Colon polyps   Social History    Social History  Social History         Tobacco Use   Smoking status: Never   Smokeless tobacco: Never  Vaping Use   Vaping status: Never Used  Substance Use Topics   Alcohol use: Yes      Alcohol/week: 1.0 standard  drink of alcohol      Types: 1 Glasses of wine per week      Comment: socially   Drug use: No          Vital Signs and Physical Examination  BP 122/78   Pulse 65   Ht 5\' 3"  (1.6 m)   Wt 167 lb (75.8 kg)   BMI 29.58 kg/m     General: Well developed, well nourished, no acute distress Head: Normocephalic and atraumatic Eyes: Sclerae anicteric, EOMI Ears: Normal auditory acuity Mouth: No deformities or lesions noted Lungs: Clear throughout to auscultation Heart: Regular rate and rhythm; No murmurs, rubs or bruits Abdomen: Soft, non tender and non distended. No masses, hepatosplenomegaly or hernias noted. Normal Bowel sounds Rectal: Deferred to colonoscopy. Musculoskeletal: Symmetrical with no gross deformities  Pulses:  Normal pulses noted Extremities: No edema or deformities noted Neurological: Alert oriented x 4, grossly nonfocal Psychological:  Alert and cooperative. Normal mood and affect   Review of Data  The following data was reviewed at the time of this encounter:   Laboratory Studies        Latest Ref Rng & Units 04/26/2024    3:31 PM 11/30/2023    2:10 PM 11/16/2023   12:07 PM  CBC  WBC 4.0 - 10.5 K/uL 8.2  9.2  7.8   Hemoglobin 12.0 - 15.0 g/dL 16.1  09.6  04.5   Hematocrit 36.0 - 46.0 % 39.3  39.1  41.3   Platelets 150.0 - 400.0 K/uL 331.0  327.0  332.0       Recent Labs  No results found for: "LIPASE"       Latest Ref Rng & Units 04/20/2024    2:34 PM 11/30/2023    2:10 PM 11/16/2023   12:07 PM  CMP  Glucose 70 - 99 mg/dL 409  811  87   BUN 6 - 23 mg/dL 8  14  10    Creatinine 0.40 - 1.20 mg/dL 9.14  7.82  9.56   Sodium 135 - 145 mEq/L 136  137  138   Potassium 3.5 - 5.1 mEq/L 4.2  3.9   4.5   Chloride 96 - 112 mEq/L 101  101  103   CO2 19 - 32 mEq/L 28  28  28    Calcium 8.4 - 10.5 mg/dL 21.3  08.6  57.8    46.9   Total Protein 6.0 - 8.3 g/dL 6.8    7.0   Total Bilirubin 0.2 - 1.2 mg/dL 0.6    0.7   Alkaline Phos 39 - 117 U/L 97    123   AST 0 - 37 U/L 33    26   ALT 0 - 35 U/L 20    22       Imaging Studies    Echocardiogram 01/04/2014 - LVEF 65 to 70%     GI Procedures and Studies  Colonoscopy 09/15/2017 [facility], Dr. Baldomero Bone) - Entire colon and terminal ileum normal   Clinical Impression  It is my clinical impression that Carla Mckinney is a 63 y.o. female with;   Hematochezia Change in bowel habits Unintentional weight loss Heartburn Early satiety/decreased appetite   Carla Mckinney presents to the office today for evaluation and management of hematochezia, change in bowel habits, 20 pound unintentional weight loss, heartburn, early satiety and decreased appetite.  Despite her symptoms she denies generalized malaise.  Her husband believes that she has been somewhat more fatigued.  Laboratory studies including  CBC, CMP, iron panel, TSH and coagulation factors have been unrevealing.  At today's visit we discussed that that the differential diagnosis for her symptoms could include GERD, H. pylori infection, celiac disease, gastrointestinal malignancy, colon polyps, inflammatory bowel disease, intestinal angioectasias.  Given alarm features of her symptoms I have recommended proceeding with EGD and colonoscopy as well as CT scan of the abdomen pelvis given her unintentional weight loss.   Plan  Will plan for EGD/Colonoscopy. I thoroughly discussed the procedure with the patient to include nature of the procedure, alternatives, benefits, and risks (including but not limited to bleeding, infection, perforation, anesthesia/cardiac/pulmonary complications). Patient verbalized understanding and gave verbal consent to proceed with procedure.  Order CT  Abdomen/Pelvis for evaluation of unintentional weight loss Continue OTC Pepcid  for management of heartburn -pending EGD results further recommendations can be provided regarding management of the symptoms Monitor weight and anthropometrics   Planned Follow Up TBD pending results of workup outlined above   The patient or caregiver verbalized understanding of the material covered, with no barriers to understanding. All questions were answered. Patient or caregiver is agreeable with the plan outlined above.     It was a pleasure to see Carla Mckinney.  If you have any questions or concerns regarding this evaluation, do not hesitate to contact me.   Eugenia Hess, MD [facility]    I have reviewed the clinic note as outlined by Valiant Gaul, PA and agree with the assessment, plan and medical decision making.   Ms. Tatham presents with symptoms with alarm features including hematochezia, weight loss, change in bowel habits, heartburn, early satiety and decreased appetite.  Labs have been unrevealing.  Colonoscopy in 2018 was normal.  Given the concerning nature of her symptoms agree with proceeding with cross-sectional imaging, EGD and colonoscopy.   Eugenia Hess, MD      For EGD/colon today MRI - R  hepatic mass measuring 15 cm cm ?colon mass. IR Bx-  scheduled for 5/22. PET for 5/27 CEA:  305. Nl AFP. CA 19-9: 176   Magnus Schuller, MD [facility] [phone]

## 2024-05-09 NOTE — Op Note (Signed)
  [facility] Patient Name: Carla Mckinney Procedure Date: 05/09/2024 2:40 PM MRN: [id number] Endoscopist: Lajuan Pila , MD, [id number] Age: 63 Referring MD:  Date of Birth: 07-03-1961 Gender: Female Account #: [id number] Procedure:                Upper GI endoscopy Indications:              Epigastric abdominal pain, wt loss, Abn CT/MRI Medicines:                Monitored Anesthesia Care Procedure:                Pre-Anesthesia Assessment:                           - Prior to the procedure, a History and Physical                            was performed, and patient medications and                            allergies were reviewed. The patient's tolerance of                            previous anesthesia was also reviewed. The risks                            and benefits of the procedure and the sedation                            options and risks were discussed with the patient.                            All questions were answered, and informed consent                            was obtained. Prior Anticoagulants: The patient has                            taken no anticoagulant or antiplatelet agents. ASA                            Grade Assessment: III - A patient with severe                            systemic disease. After reviewing the risks and                            benefits, the patient was deemed in satisfactory                            condition to undergo the procedure.                           After obtaining informed consent, the endoscope was  passed under direct vision. Throughout the                            procedure, the patient's blood pressure, pulse, and                            oxygen saturations were monitored continuously. The                            Olympus Scope SN [id number] was introduced through the                            mouth, and advanced to the second part of duodenum.                             The upper GI endoscopy was accomplished without                            difficulty. The patient tolerated the procedure                            well. Scope In: Scope Out: Findings:                 The examined esophagus was normal.                           The Z-line was regular and was found 35 cm from the                            incisors.                           The entire examined stomach was normal.                           A medium non-bleeding diverticulum was found in the                            second portion of the duodenum.                           The exam was otherwise without abnormality. Complications:            No immediate complications. Estimated Blood Loss:     Estimated blood loss: none. Impression:               - Normal EGD.                           - Incidental duodenal diverticula                           - No specimens collected. Recommendation:           - Patient has a contact number available for  emergencies. The signs and symptoms of potential                            delayed complications were discussed with the                            patient. Return to normal activities tomorrow.                            Written discharge instructions were provided to the                            patient.                           - Resume previous diet.                           - Continue present medications. Proceed with                            colonoscopy.                           - The findings and recommendations were discussed                            with the patient's family. Lajuan Pila, MD 05/09/2024 3:14:56 PM This report has been signed electronically.

## 2024-05-09 NOTE — Telephone Encounter (Signed)
 Requesting to speak with a nurse.   Vomiting during prep   Please advise

## 2024-05-09 NOTE — Progress Notes (Signed)
 Colon cancer dx.

## 2024-05-09 NOTE — Op Note (Addendum)
 [facility] Patient Name: Carla Mckinney Procedure Date: 05/09/2024 2:30 PM MRN: [id number] Endoscopist: Lajuan Pila , MD, [id number] Age: 63 Referring MD:  Date of Birth: 02/24/1961 Gender: Female Account #: [id number] Procedure:                Colonoscopy Indications:              Rectal bleeding with abn CT/MRI showing a large                            liver mass. Elevated CEA level. Medicines:                Monitored Anesthesia Care Procedure:                Pre-Anesthesia Assessment:                           - Prior to the procedure, a History and Physical                            was performed, and patient medications and                            allergies were reviewed. The patient's tolerance of                            previous anesthesia was also reviewed. The risks                            and benefits of the procedure and the sedation                            options and risks were discussed with the patient.                            All questions were answered, and informed consent                            was obtained. Prior Anticoagulants: The patient has                            taken no anticoagulant or antiplatelet agents. ASA                            Grade Assessment: III - A patient with severe                            systemic disease. After reviewing the risks and                            benefits, the patient was deemed in satisfactory                            condition to undergo the procedure.  After obtaining informed consent, the colonoscope                            was passed under direct vision. Throughout the                            procedure, the patient's blood pressure, pulse, and                            oxygen saturations were monitored continuously. The                            Olympus Scope SN [id number] was introduced through the                            anus and advanced to  the 2 cm into the ileum. The                            colonoscopy was performed without difficulty. The                            patient tolerated the procedure well. The quality                            of the bowel preparation was good. The terminal                            ileum, ileocecal valve, appendiceal orifice, and                            rectum were photographed. Scope In: 2:59:43 PM Scope Out: 3:12:52 PM Scope Withdrawal Time: 0 hours 9 minutes 41 seconds  Total Procedure Duration: 0 hours 13 minutes 9 seconds  Findings:                 5 cm fungating, infiltrative, sessile and ulcerated                            non-obstructing medium-sized mass was found in the                            recto-sigmoid colon extending from 15 up to 20 cm.                            The mass was partially circumferential (involving                            one-half of the lumen circumference). The pediatric                            colonoscope could easily passed beyond. No                            obstruction. This was biopsied with  a cold forceps                            for histology. Area just distal to the mass was                            tattooed with an injection of 1 mL of India ink.                            Estimated blood loss was minimal.                           Non-bleeding internal hemorrhoids were found during                            retroflexion. The hemorrhoids were small and Grade                            I (internal hemorrhoids that do not prolapse).                           The terminal ileum appeared normal.                           The exam was otherwise without abnormality on                            direct and retroflexion views. Complications:            No immediate complications. Estimated Blood Loss:     Estimated blood loss: none. Impression:               - Malignant tumor in the recto-sigmoid colon.                             Biopsied. Tattooed.                           - Non-bleeding internal hemorrhoids.                           - The examined portion of the ileum was normal.                           - The examination was otherwise normal on direct                            and retroflexion views. Recommendation:           - Patient has a contact number available for                            emergencies. The signs and symptoms of potential                            delayed complications were discussed with the  patient. Return to normal activities tomorrow.                            Written discharge instructions were provided to the                            patient.                           - Resume previous diet.                           - Continue present medications.                           - Await pathology results. Sent [facility].                           - Follow-up with oncology                           - The findings and recommendations were discussed                            with the patient's family.                           - D/W Dr Yvone Herd. Lajuan Pila, MD 05/09/2024 3:24:39 PM This report has been signed electronically.

## 2024-05-09 NOTE — Telephone Encounter (Signed)
 Returned the patient's phone call. She reports taking al of her prep yesterday but vomited directly after taking her second prep this morning. She is having brown liquid stool currently. Encouraged her to drink as much fluid as she can until 10:30 when she is required to stop drinking and to let us  know if her results are not clearing up. Pt verbalized understanding.

## 2024-05-09 NOTE — Progress Notes (Signed)
 Called to room to assist during endoscopic procedure.  Patient ID and intended procedure confirmed with present staff. Received instructions for my participation in the procedure from the performing physician.

## 2024-05-09 NOTE — Progress Notes (Signed)
 Sedate, gd SR, tolerated procedure well, VSS, report to RN

## 2024-05-10 ENCOUNTER — Other Ambulatory Visit: Payer: Self-pay

## 2024-05-10 ENCOUNTER — Telehealth: Payer: Self-pay

## 2024-05-10 ENCOUNTER — Other Ambulatory Visit: Payer: Self-pay | Admitting: Radiology

## 2024-05-10 DIAGNOSIS — C189 Malignant neoplasm of colon, unspecified: Secondary | ICD-10-CM | POA: Insufficient documentation

## 2024-05-10 LAB — SURGICAL PATHOLOGY

## 2024-05-10 NOTE — Progress Notes (Signed)
 Patient for IR Port Insert on Thurs 05/11/24, I called and spoke with the patient on the phone and gave pre-procedure instructions. Pt was made aware to be here at 10a, NPO after MN prior to procedure as well as driver post procedure/recovery/discharge. Pt stated understanding.  Called   05/10/24

## 2024-05-10 NOTE — Telephone Encounter (Addendum)
 [facility] NGS DNA/RNA, IHC MMR requested on specimen [id number], rectosigmoid colon biopsy, collected 05/09/2024. Per Dr. Randy Buttery liver biopsy may be cancelled. IR notified. Port will stay as scheduled. Mrs. Deamer was notified.

## 2024-05-10 NOTE — Telephone Encounter (Signed)
  Follow up Call-     05/09/2024    2:05 PM  Call back number  Post procedure Call Back phone  # [phone]  Permission to leave phone message Yes     Patient questions:  Do you have a fever, pain , or abdominal swelling? No. Pain Score  0 *  Have you tolerated food without any problems? Yes.    Have you been able to return to your normal activities? Yes.    Do you have any questions about your discharge instructions: Diet   No. Medications  No. Follow up visit  No.  Do you have questions or concerns about your Care? No.  Actions: * If pain score is 4 or above: No action needed, pain <4.

## 2024-05-11 ENCOUNTER — Encounter: Payer: Self-pay | Admitting: Radiology

## 2024-05-11 ENCOUNTER — Ambulatory Visit
Admission: RE | Admit: 2024-05-11 | Discharge: 2024-05-11 | Disposition: A | Discharge status: Home / Self Care | Source: Ambulatory Visit | Attending: Oncology | Admitting: Oncology

## 2024-05-11 ENCOUNTER — Other Ambulatory Visit: Payer: Self-pay

## 2024-05-11 DIAGNOSIS — R160 Hepatomegaly, not elsewhere classified: Secondary | ICD-10-CM | POA: Diagnosis not present

## 2024-05-11 DIAGNOSIS — Z452 Encounter for adjustment and management of vascular access device: Secondary | ICD-10-CM | POA: Diagnosis not present

## 2024-05-11 HISTORY — PX: IR IMAGING GUIDED PORT INSERTION: IMG5740

## 2024-05-11 MED ORDER — MIDAZOLAM HCL 2 MG/2ML IJ SOLN
INTRAMUSCULAR | Status: AC | PRN
  Administered 2024-05-11: 1 mg via INTRAVENOUS
  Administered 2024-05-11 (×2): .5 mg via INTRAVENOUS

## 2024-05-11 MED ORDER — HEPARIN SOD (PORK) LOCK FLUSH 100 UNIT/ML IV SOLN
INTRAVENOUS | Status: AC
  Filled 2024-05-11: qty 5

## 2024-05-11 MED ORDER — HEPARIN SOD (PORK) LOCK FLUSH 100 UNIT/ML IV SOLN
500.0000 [IU] | Freq: Once | INTRAVENOUS | Status: AC
  Administered 2024-05-11: 500 [IU] via INTRAVENOUS

## 2024-05-11 MED ORDER — LIDOCAINE HCL 1 % IJ SOLN
INTRAMUSCULAR | Status: AC
  Filled 2024-05-11: qty 20

## 2024-05-11 MED ORDER — SODIUM CHLORIDE 0.9 % IV SOLN: INTRAVENOUS | Status: DC

## 2024-05-11 MED ORDER — LIDOCAINE HCL (PF) 1 % IJ SOLN
18.0000 mL | Freq: Once | INTRAMUSCULAR | Status: AC
  Administered 2024-05-11: 18 mL

## 2024-05-11 MED ORDER — MIDAZOLAM HCL 2 MG/2ML IJ SOLN
INTRAMUSCULAR | Status: AC
  Filled 2024-05-11: qty 2

## 2024-05-11 MED ORDER — FENTANYL CITRATE (PF) 100 MCG/2ML IJ SOLN
INTRAMUSCULAR | Status: AC
  Filled 2024-05-11: qty 2

## 2024-05-11 MED ORDER — FENTANYL CITRATE (PF) 100 MCG/2ML IJ SOLN
INTRAMUSCULAR | Status: AC | PRN
  Administered 2024-05-11: 25 ug via INTRAVENOUS
  Administered 2024-05-11: 50 ug via INTRAVENOUS
  Administered 2024-05-11: 25 ug via INTRAVENOUS

## 2024-05-11 NOTE — H&P (Signed)
 Chief Complaint: Patient was seen in consultation today for liver mass, with consideration for liver mass biopsy and Port-A-Cath placement.  Referring Provider(s): Dr. Seretha Dance, MD   Supervising Physician: Myrlene Asper  Patient Status: [facility] - Out-pt  Patient is Full Code  History of Present Illness: Carla Mckinney is a 63 y.o. female  with PMHx notable for HTN, hypercalcemia secondary to hyperparathyroidism, and rheumatic fever.   Per Dr. Denese Finn progress note on 5/16: "Patient is [...] referred for multifocal liver masses and elevated CEA concerning for metastatic colorectal carcinoma   I have reviewed patient's CT abdomen and pelvis images as well as MRI images independently and discussed findings with the patient which shows bulky heterogeneous masses occupying almost the entire right lobe of the liver.  MRI also showed possible mass in the rectosigmoid thereby suggesting the possibility of colorectal primary.  aFP is normal.  CA 19-9 was mildly elevated but there is no abnormality noted in the pancreas on MRI.  CEA significantly elevated at 305.  We will plan to get ultrasound-guided liver biopsy which is going to be done next week.  We will also plan for PET CT scan following liver biopsy.  We are reaching out to [facility] to see if they can expedite the colonoscopy and get it done sooner instead of 4 weeks from now.   [...] We will plan for port placement on the day of liver biopsy as well.  I will discuss genetic testing at my next visit.  There is no significant family history of breast or colon cancer or melanoma."    Interventional Radiology was requested for liver lesion biopsy, though this was canceled as enough tissue was obtained for evaluation at time of colonoscopy. IR was also requested for Port-A-Cath placement. Patient is scheduled for same in IR today.   Patient is alert and laying in bed, calm. Husband is at bedside. Patient is currently without any  significant complaints.  Patient denies any fevers, headache, chest pain, SOB, cough, abdominal pain, nausea, vomiting or bleeding.     Past Medical History:  Diagnosis Date   Hypertension    Rheumatic fever 1986   has had echo since then. no cardiac issues.    Past Surgical History:  Procedure Laterality Date   ABDOMINAL HYSTERECTOMY     AUGMENTATION MAMMAPLASTY Bilateral    breast lift   CARDIAC CATHETERIZATION  2002   normal, due to syncope and abnormal myowiew ([place])   COLONOSCOPY WITH PROPOFOL  N/A 09/15/2017   Procedure: COLONOSCOPY WITH PROPOFOL ;  Surgeon: Selena Daily, MD;  Location: [facility];  Service: Gastroenterology;  Laterality: N/A;   MASTOPEXY  2008   OOPHORECTOMY     secondary to scar tissue,  [facility]   RIGHT OOPHORECTOMY     TONSILLECTOMY     TUBAL LIGATION      Allergies: Lisinopril   Medications: Prior to Admission medications   Medication Sig Start Date End Date Taking? Authorizing Provider  acetaminophen (TYLENOL) 325 MG tablet Take 650 mg by mouth every 6 (six) hours as needed.    [provider]  amLODipine  (NORVASC ) 10 MG tablet Take 1 tablet (10 mg total) by mouth daily. 04/19/24   Thersia Flax, MD  aspirin 81 MG tablet Take 81 mg by mouth daily.    [provider]  cholecalciferol (VITAMIN D3) 25 MCG (1000 UNIT) tablet Take 1,000 Units by mouth daily.    [provider]  diltiazem  (CARDIZEM ) 30 MG  tablet Take 1 tablet (30 mg total) by mouth as needed (rapid heart rate). 12/03/22   Thersia Flax, MD  furosemide  (LASIX ) 20 MG tablet Take 1 tablet (20 mg total) by mouth daily as needed. 04/09/20   Thersia Flax, MD  metoprolol  succinate (TOPROL -XL) 100 MG 24 hr tablet Take 1 tablet (100 mg total) by mouth daily. 04/19/24 04/19/25  Thersia Flax, MD  oxyCODONE  (OXY IR/ROXICODONE ) 5 MG immediate release tablet Take 1 tablet (5 mg total) by mouth every 4 (four) hours as needed for severe pain (pain score 7-10).  05/05/24   Avonne Boettcher, MD     Family History  Problem Relation Age of Onset   Heart disease Mother    Heart failure Mother    Hypertension Father    Hyperlipidemia Father    Heart disease Maternal Grandfather    Heart disease Paternal Grandmother    Stroke Paternal Grandmother 49   Cancer Paternal Grandfather        multiple myeloma   Breast cancer Neg Hx     Social History   Socioeconomic History   Marital status: Married    Spouse name: Not on file   Number of children: 2   Years of education: Not on file   Highest education level: Associate degree: academic program  Occupational History   Not on file  Tobacco Use   Smoking status: Never   Smokeless tobacco: Never  Vaping Use   Vaping status: Never Used  Substance and Sexual Activity   Alcohol use: Yes    Alcohol/week: 1.0 standard drink of alcohol    Types: 1 Glasses of wine per week    Comment: socially   Drug use: No   Sexual activity: Yes    Birth control/protection: None  Other Topics Concern   Not on file  Social History Narrative   Not on file   Social Drivers of Health   Financial Resource Strain: Low Risk  (11/16/2023)   Overall Financial Resource Strain (CARDIA)    Difficulty of Paying Living Expenses: Not hard at all  Food Insecurity: No Food Insecurity (05/05/2024)   Hunger Vital Sign    Worried About Running Out of Food in the Last Year: Never true    Ran Out of Food in the Last Year: Never true  Transportation Needs: No Transportation Needs (05/05/2024)   PRAPARE - Administrator, Civil Service (Medical): No    Lack of Transportation (Non-Medical): No  Physical Activity: Insufficiently Active (05/05/2024)   Exercise Vital Sign    Days of Exercise per Week: 2 days    Minutes of Exercise per Session: 20 min  Stress: No Stress Concern Present (11/16/2023)   [employer] of Occupational Health - Occupational Stress Questionnaire    Feeling of Stress : Not at all  Social  Connections: Moderately Integrated (11/16/2023)   Social Connection and Isolation Panel [NHANES]    Frequency of Communication with Friends and Family: More than three times a week    Frequency of Social Gatherings with Friends and Family: Twice a week    Attends Religious Services: More than 4 times per year    Active Member of [employer] or Organizations: No    Attends Engineer, structural: Not on file    Marital Status: Married     Review of Systems: A 12 point ROS discussed and pertinent positives are indicated in the HPI above.  All other systems are negative.  Vital Signs:  Wt Readings from Last 3 Encounters:  05/11/24 160 lb (72.6 kg)  05/09/24 167 lb (75.8 kg)  05/05/24 163 lb 4.8 oz (74.1 kg)   Temp Readings from Last 3 Encounters:  05/11/24 98.3 F (36.8 C) (Oral)  05/09/24 97.8 F (36.6 C)  05/05/24 100 F (37.8 C) (Tympanic)   BP Readings from Last 3 Encounters:  05/11/24 136/77  05/09/24 112/73  05/05/24 128/73   Pulse Readings from Last 3 Encounters:  05/11/24 66  05/09/24 83  05/05/24 84     Advance Care Plan: The advanced care place/surrogate decision maker was discussed at the time of visit and the patient did not wish to discuss or was not able to name a surrogate decision maker or provide an advance care plan.  Physical Exam Physical Exam Constitutional:      Appearance: Normal appearance.  HENT:     Mouth/Throat:     Mouth: Mucous membranes are dry.  Cardiovascular:     Rate and Rhythm: Normal rate and regular rhythm.     Pulses: Normal pulses.     Heart sounds: No murmur heard. Pulmonary:     Effort: Pulmonary effort is normal.     Breath sounds: Normal breath sounds. No wheezing.  Abdominal:     General: Abdomen is flat.     Palpations: Abdomen is soft.  Musculoskeletal:        General: Normal range of motion.     Cervical back: Normal range of motion and neck supple.  Skin:    General: Skin is warm and dry.  Neurological:      Mental Status: She is alert and oriented to person, place, and time.  Psychiatric:        Mood and Affect: Mood normal.        Behavior: Behavior normal.        Thought Content: Thought content normal.        Judgment: Judgment normal.      Imaging: MR ABDOMEN W WO CONTRAST Result Date: 05/04/2024 CLINICAL DATA:  Liver masses EXAM: MRI ABDOMEN WITHOUT AND WITH CONTRAST TECHNIQUE: Multiplanar multisequence MR imaging of the abdomen was performed both before and after the administration of intravenous contrast. CONTRAST:  7mL GADAVIST  GADOBUTROL  1 MMOL/ML IV SOLN COMPARISON:  CT abdomen pelvis, 03/03/2024 FINDINGS: Lower chest: No acute abnormality. Hepatobiliary: Extremely bulky heterogeneously hypoenhancing masses occupying the majority of the right lobe of the liver, dominant mass measuring 14.9 x 12.9 x 9.1 cm (series 16, image 45, series 3, image 17). Occasional incidental, benign cysts scattered throughout the liver. No gallstones, gallbladder wall thickening, or biliary dilatation. Pancreas: Unremarkable. No pancreatic ductal dilatation or surrounding inflammatory changes. Spleen: Normal in size without significant abnormality. Adrenals/Urinary Tract: Adrenal glands are unremarkable. Kidneys are normal, without renal calculi, solid lesion, or hydronephrosis. Stomach/Bowel: Stomach is within normal limits. No evidence of bowel wall thickening, distention, or inflammatory changes. Vascular/Lymphatic: No significant vascular findings are present. No enlarged abdominal lymph nodes. Other: No abdominal wall hernia or abnormality. No ascites. Musculoskeletal: No acute or significant osseous findings. IMPRESSION: 1. Extremely bulky heterogeneously hypoenhancing masses occupying the majority of the right lobe of the liver, dominant mass measuring 14.9 x 12.9 x 9.1 cm. Findings are consistent with hepatic metastatic disease. No metastases within the left lobe. In retrospective review of CT dated 05/03/2024,  there is a rectosigmoid colon mass with associated mesenteric lymphadenopathy, which is presumably the primary origin of these metastases. 2. No other evidence of lymphadenopathy or metastatic  disease in the abdomen. Electronically Signed   By: Fredricka Jenny M.D.   On: 05/04/2024 21:25   US  ABDOMEN LIMITED RUQ (LIVER/GB) Result Date: 05/03/2024 CLINICAL DATA:  Right upper quadrant pain EXAM: ULTRASOUND ABDOMEN LIMITED RIGHT UPPER QUADRANT COMPARISON:  CT earlier today FINDINGS: Gallbladder: No gallstones or wall thickening visualized. No sonographic Murphy sign noted by sonographer. Common bile duct: Diameter: Normal caliber, 3 mm Liver: Multiple heterogeneous solid masses, the largest measurable mass in the right hepatic lobe measuring up to 10.4 cm. These do not have a sonographic appearance of hemangiomas. Few scattered cysts, the largest 3.5 cm in the inferior right lobe. Portal vein is patent on color Doppler imaging with normal direction of blood flow towards the liver. Other: None. IMPRESSION: Multiple heterogeneous solid masses in the right hepatic lobe as seen on CT concerning for metastases or primary hepatocellular carcinoma. Recommend further evaluation with MRI. Electronically Signed   By: Janeece Mechanic M.D.   On: 05/03/2024 13:44   CT ABDOMEN PELVIS W CONTRAST Result Date: 05/03/2024 CLINICAL DATA:  Right abdominal pain EXAM: CT ABDOMEN AND PELVIS WITH CONTRAST TECHNIQUE: Multidetector CT imaging of the abdomen and pelvis was performed using the standard protocol following bolus administration of intravenous contrast. RADIATION DOSE REDUCTION: This exam was performed according to the departmental dose-optimization program which includes automated exposure control, adjustment of the mA and/or kV according to patient size and/or use of iterative reconstruction technique. CONTRAST:  OMNIPAQUE  IOHEXOL  300 MG/ML  SOLN COMPARISON:  None Available. FINDINGS: Lower chest: No acute abnormality  Hepatobiliary: Large enhancing masses within the liver measuring up to 12.5 cm in the right hepatic lobe on image 24 and 8.5 cm in the posterior right hepatic dome on image 16. Scattered water  density small cysts throughout the liver. Gallbladder unremarkable. No biliary ductal dilatation. Pancreas: No focal abnormality or ductal dilatation. Spleen: No focal abnormality.  Normal size. Adrenals/Urinary Tract: No adrenal abnormality. No focal renal abnormality. No stones or hydronephrosis. Urinary bladder is unremarkable. Stomach/Bowel: Normal appendix. Stomach, large and small bowel grossly unremarkable. Vascular/Lymphatic: No evidence of aneurysm or adenopathy. Reproductive: Prior hysterectomy.  No adnexal masses. Other: No free fluid or free air. Musculoskeletal: No acute bony abnormality or focal bone lesion. IMPRESSION: Large enhancing masses within the liver. Differential considerations would include metastases, primary hepatic malignancy or giant hemangiomas. Recommend further evaluation with MRI. Electronically Signed   By: Janeece Mechanic M.D.   On: 05/03/2024 13:09    Labs:  CBC: Recent Labs    11/16/23 1207 11/30/23 1410 04/26/24 1531 05/03/24 1137  WBC 7.8 9.2 8.2 9.4  HGB 13.6 13.0 13.2 13.8  HCT 41.3 39.1 39.3 41.4  PLT 332.0 327.0 331.0 348    COAGS: Recent Labs    04/20/24 1434  INR 1.0  APTT 28    BMP: Recent Labs    11/16/23 1207 11/30/23 1410 04/20/24 1434 05/03/24 1137  NA 138 137 136 136  K 4.5 3.9 4.2 4.3  CL 103 101 101 103  CO2 28 28 28 24   GLUCOSE 87 134* 108* 104*  BUN 10 14 8 10   CALCIUM 11.2*  11.0* 10.3 10.2 10.7*  CREATININE 0.71 0.93 0.71 0.85  GFRNONAA  --   --   --  >60    LIVER FUNCTION TESTS: Recent Labs    11/16/23 1207 04/20/24 1434 05/03/24 1137  BILITOT 0.7 0.6 1.4*  AST 26 33 37  ALT 22 20 24   ALKPHOS 123* 97 103  PROT 7.0  6.8 7.6  ALBUMIN 4.5 4.1 4.2    TUMOR MARKERS: No results for input(s): "AFPTM", "CEA", "CA199",  "CHROMGRNA" in the last 8760 hours.  Assessment and Plan: Per Dr. Denese Finn progress note on 5/16: "Patient is [...] referred for multifocal liver masses and elevated CEA concerning for metastatic colorectal carcinoma.   I have reviewed patient's CT abdomen and pelvis images as well as MRI images independently and discussed findings with the patient which shows bulky heterogeneous masses occupying almost the entire right lobe of the liver.  MRI also showed possible mass in the rectosigmoid thereby suggesting the possibility of colorectal primary.  [...] We will plan to get ultrasound-guided liver biopsy which is going to be done next week.  [...]   [...] We will plan for port placement on the day of liver biopsy as well.  I will discuss genetic testing at my next visit.  There is no significant family history of breast or colon cancer or melanoma."  Patient presents for scheduled Port-A-Cath placement in IR today. Liver biopsy request was canceled as adequate tissue samples were obtained at time of colonoscopy.  Patient has been NPO since midnight.  All labs and medications are within acceptable parameters.  No pertinent allergies.   Risks and benefits of image guided port-a-catheter placement was discussed with the patient including, but not limited to bleeding, infection, pneumothorax, or fibrin sheath development and need for additional procedures.  All of the questions were answered and there is agreement to proceed.  Consent signed and in chart.     Thank you for allowing our service to participate in Carla Mckinney 's care.  Electronically Signed: Lovena Rubinstein, PA-C   05/10/2024, 1:01 PM      I spent a total of 30 Minutes in face to face in clinical consultation, greater than 50% of which was counseling/coordinating care for liver mass, with consideration for liver mass biopsy and Port-A-Cath placement.

## 2024-05-11 NOTE — Procedures (Signed)
 Interventional Radiology Procedure Note  Procedure: Placement of a right IJ approach single lumen PowerPort.  Tip is positioned at the superior cavoatrial junction and catheter is ready for immediate use.   Complications: None Recommendations:  - Ok to shower tomorrow - Do not submerge for 7 days - Routine line care  - Advance diet  Signed,  Marciano Settles. Mabel Savage, DO, ABVM, RPVI

## 2024-05-12 ENCOUNTER — Ambulatory Visit

## 2024-05-16 ENCOUNTER — Telehealth: Payer: Self-pay

## 2024-05-16 ENCOUNTER — Ambulatory Visit
Admission: RE | Admit: 2024-05-16 | Discharge: 2024-05-16 | Disposition: A | Discharge status: Home / Self Care | Source: Ambulatory Visit | Attending: Oncology | Admitting: Oncology

## 2024-05-16 DIAGNOSIS — R911 Solitary pulmonary nodule: Secondary | ICD-10-CM | POA: Diagnosis not present

## 2024-05-16 DIAGNOSIS — R160 Hepatomegaly, not elsewhere classified: Secondary | ICD-10-CM | POA: Insufficient documentation

## 2024-05-16 DIAGNOSIS — C189 Malignant neoplasm of colon, unspecified: Secondary | ICD-10-CM | POA: Insufficient documentation

## 2024-05-16 DIAGNOSIS — C787 Secondary malignant neoplasm of liver and intrahepatic bile duct: Secondary | ICD-10-CM | POA: Diagnosis not present

## 2024-05-16 LAB — GLUCOSE, CAPILLARY: Glucose-Capillary: 88 mg/dL (ref 70–99)

## 2024-05-16 MED ORDER — FLUDEOXYGLUCOSE F - 18 (FDG) INJECTION
8.4000 | Freq: Once | INTRAVENOUS | Status: AC | PRN
  Administered 2024-05-16: 8.4 via INTRAVENOUS

## 2024-05-16 NOTE — Telephone Encounter (Signed)
 Spoke with [name] to make her aware that Dr. Randy Buttery will complete her treatment plan in the next day or so. She is awaiting her MMR results. Plan will be to start her treatments early next week. No further questions at this time.

## 2024-05-17 ENCOUNTER — Other Ambulatory Visit: Payer: Self-pay

## 2024-05-17 ENCOUNTER — Other Ambulatory Visit: Payer: Self-pay | Admitting: Oncology

## 2024-05-17 DIAGNOSIS — C189 Malignant neoplasm of colon, unspecified: Secondary | ICD-10-CM

## 2024-05-17 MED ORDER — ONDANSETRON HCL 8 MG PO TABS
8.0000 mg | ORAL_TABLET | Freq: Three times a day (TID) | ORAL | 1 refills | Status: DC | PRN
  Filled 2024-05-17: qty 30, 10d supply, fill #0

## 2024-05-17 MED ORDER — LIDOCAINE-PRILOCAINE 2.5-2.5 % EX CREA
TOPICAL_CREAM | CUTANEOUS | 3 refills | Status: AC
  Filled 2024-05-17: qty 30, 30d supply, fill #0
  Filled 2024-09-17: qty 30, 30d supply, fill #1

## 2024-05-17 MED ORDER — PROCHLORPERAZINE MALEATE 10 MG PO TABS
10.0000 mg | ORAL_TABLET | Freq: Four times a day (QID) | ORAL | 1 refills | Status: AC | PRN
Start: 2024-05-17 — End: ?
  Filled 2024-05-17: qty 30, 8d supply, fill #0

## 2024-05-17 MED ORDER — DEXAMETHASONE 4 MG PO TABS
8.0000 mg | ORAL_TABLET | Freq: Every day | ORAL | 1 refills | Status: DC
  Filled 2024-05-17: qty 30, 15d supply, fill #0
  Filled 2024-08-24: qty 30, 15d supply, fill #1

## 2024-05-17 NOTE — Progress Notes (Signed)
 Pharmacist Chemotherapy Monitoring - Initial Assessment    Anticipated start date: 05/22/24   The following has been reviewed per standard work regarding the patient's treatment regimen: The patient's diagnosis, treatment plan and drug doses, and organ/hematologic function Lab orders and baseline tests specific to treatment regimen  The treatment plan start date, drug sequencing, and pre-medications Prior authorization status  Patient's documented medication list, including drug-drug interaction screen and prescriptions for anti-emetics and supportive care specific to the treatment regimen The drug concentrations, fluid compatibility, administration routes, and timing of the medications to be used The patient's access for treatment and lifetime cumulative dose history, if applicable  The patient's medication allergies and previous infusion related reactions, if applicable   Changes made to treatment plan:  N/A  Follow up needed:  Pending authorization for treatment    Glendora Landsman, PharmD, BCPS Clinical Pharmacist   05/17/2024  3:36 PM

## 2024-05-17 NOTE — Progress Notes (Signed)
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

## 2024-05-18 ENCOUNTER — Other Ambulatory Visit: Payer: Self-pay

## 2024-05-18 ENCOUNTER — Telehealth: Payer: Self-pay

## 2024-05-18 NOTE — Telephone Encounter (Signed)
 Spoke with [name]. Chemo teach arranged for 05/19/24, FOLFOX plus BEV.

## 2024-05-19 ENCOUNTER — Encounter: Payer: Self-pay | Admitting: Oncology

## 2024-05-19 ENCOUNTER — Inpatient Hospital Stay

## 2024-05-19 DIAGNOSIS — C189 Malignant neoplasm of colon, unspecified: Secondary | ICD-10-CM

## 2024-05-22 ENCOUNTER — Inpatient Hospital Stay

## 2024-05-22 ENCOUNTER — Encounter: Payer: Self-pay | Admitting: Oncology

## 2024-05-22 ENCOUNTER — Inpatient Hospital Stay (HOSPITAL_BASED_OUTPATIENT_CLINIC_OR_DEPARTMENT_OTHER): Attending: Oncology | Admitting: Oncology

## 2024-05-22 ENCOUNTER — Inpatient Hospital Stay: Attending: Oncology

## 2024-05-22 VITALS — BP 111/71 | HR 72 | Temp 97.8°F | Resp 18 | Ht 63.0 in | Wt 159.8 lb

## 2024-05-22 DIAGNOSIS — D701 Agranulocytosis secondary to cancer chemotherapy: Secondary | ICD-10-CM | POA: Insufficient documentation

## 2024-05-22 DIAGNOSIS — Z5111 Encounter for antineoplastic chemotherapy: Secondary | ICD-10-CM | POA: Insufficient documentation

## 2024-05-22 DIAGNOSIS — T451X5A Adverse effect of antineoplastic and immunosuppressive drugs, initial encounter: Secondary | ICD-10-CM | POA: Diagnosis not present

## 2024-05-22 DIAGNOSIS — Z5189 Encounter for other specified aftercare: Secondary | ICD-10-CM | POA: Insufficient documentation

## 2024-05-22 DIAGNOSIS — C787 Secondary malignant neoplasm of liver and intrahepatic bile duct: Secondary | ICD-10-CM | POA: Diagnosis not present

## 2024-05-22 DIAGNOSIS — C187 Malignant neoplasm of sigmoid colon: Secondary | ICD-10-CM | POA: Insufficient documentation

## 2024-05-22 DIAGNOSIS — C189 Malignant neoplasm of colon, unspecified: Secondary | ICD-10-CM | POA: Diagnosis not present

## 2024-05-22 DIAGNOSIS — E871 Hypo-osmolality and hyponatremia: Secondary | ICD-10-CM | POA: Insufficient documentation

## 2024-05-22 DIAGNOSIS — Z79899 Other long term (current) drug therapy: Secondary | ICD-10-CM | POA: Insufficient documentation

## 2024-05-22 DIAGNOSIS — G893 Neoplasm related pain (acute) (chronic): Secondary | ICD-10-CM | POA: Insufficient documentation

## 2024-05-22 DIAGNOSIS — Z7982 Long term (current) use of aspirin: Secondary | ICD-10-CM | POA: Insufficient documentation

## 2024-05-22 DIAGNOSIS — Z5112 Encounter for antineoplastic immunotherapy: Secondary | ICD-10-CM | POA: Insufficient documentation

## 2024-05-22 LAB — CMP (CANCER CENTER ONLY)
ALT: 26 U/L (ref 0–44)
AST: 40 U/L (ref 15–41)
Albumin: 3.8 g/dL (ref 3.5–5.0)
Alkaline Phosphatase: 118 U/L (ref 38–126)
Anion gap: 9 (ref 5–15)
BUN: 13 mg/dL (ref 6–20)
CO2: 23 mmol/L (ref 22–32)
Calcium: 9.8 mg/dL (ref 8.9–10.3)
Chloride: 100 mmol/L (ref 98–111)
Creatinine: 0.64 mg/dL (ref 0.44–1.00)
GFR, Estimated: >60 mL/min (ref >60)
Glucose, Bld: 106 mg/dL — ABNORMAL HIGH (ref 70–99)
Potassium: 3.7 mmol/L (ref 3.5–5.1)
Sodium: 132 mmol/L — ABNORMAL LOW (ref 135–145)
Total Bilirubin: 0.8 mg/dL (ref 0.0–1.2)
Total Protein: 7.2 g/dL (ref 6.5–8.1)

## 2024-05-22 LAB — CBC WITH DIFFERENTIAL (CANCER CENTER ONLY)
Abs Immature Granulocytes: 0.03 10*3/uL (ref 0.00–0.07)
Basophils Absolute: 0.1 10*3/uL (ref 0.0–0.1)
Basophils Relative: 1 %
Eosinophils Absolute: 0.6 10*3/uL — ABNORMAL HIGH (ref 0.0–0.5)
Eosinophils Relative: 8 %
HCT: 38.8 % (ref 36.0–46.0)
Hemoglobin: 12.9 g/dL (ref 12.0–15.0)
Immature Granulocytes: 0 %
Lymphocytes Relative: 20 %
Lymphs Abs: 1.6 10*3/uL (ref 0.7–4.0)
MCH: 30.6 pg (ref 26.0–34.0)
MCHC: 33.2 g/dL (ref 30.0–36.0)
MCV: 92.2 fL (ref 80.0–100.0)
Monocytes Absolute: 0.8 10*3/uL (ref 0.1–1.0)
Monocytes Relative: 10 %
Neutro Abs: 4.9 10*3/uL (ref 1.7–7.7)
Neutrophils Relative %: 61 %
Platelet Count: 375 10*3/uL (ref 150–400)
RBC: 4.21 MIL/uL (ref 3.87–5.11)
RDW: 12.7 % (ref 11.5–15.5)
WBC Count: 8 10*3/uL (ref 4.0–10.5)
nRBC: 0 % (ref 0.0–0.2)

## 2024-05-22 LAB — TOTAL PROTEIN, URINE DIPSTICK: Protein, ur: 100 mg/dL — AB

## 2024-05-22 MED ORDER — SODIUM CHLORIDE 0.9 % IV SOLN
2400.0000 mg/m2 | INTRAVENOUS | Status: DC
  Administered 2024-05-22: 4300 mg via INTRAVENOUS
  Filled 2024-05-22 (×2): qty 86

## 2024-05-22 MED ORDER — DEXTROSE 5 % IV SOLN
INTRAVENOUS | Status: DC
  Filled 2024-05-22: qty 250

## 2024-05-22 MED ORDER — DEXAMETHASONE SODIUM PHOSPHATE 10 MG/ML IJ SOLN
10.0000 mg | Freq: Once | INTRAMUSCULAR | Status: AC
  Administered 2024-05-22: 10 mg via INTRAVENOUS
  Filled 2024-05-22: qty 1

## 2024-05-22 MED ORDER — OXALIPLATIN CHEMO INJECTION 100 MG/20ML
85.0000 mg/m2 | Freq: Once | INTRAVENOUS | Status: AC
  Administered 2024-05-22: 150 mg via INTRAVENOUS
  Filled 2024-05-22: qty 30
  Filled 2024-05-22: qty 10

## 2024-05-22 MED ORDER — FLUOROURACIL CHEMO INJECTION 2.5 GM/50ML
400.0000 mg/m2 | Freq: Once | INTRAVENOUS | Status: AC
  Administered 2024-05-22: 700 mg via INTRAVENOUS
  Filled 2024-05-22 (×5): qty 14

## 2024-05-22 MED ORDER — PALONOSETRON HCL INJECTION 0.25 MG/5ML
0.2500 mg | Freq: Once | INTRAVENOUS | Status: AC
  Administered 2024-05-22: 0.25 mg via INTRAVENOUS
  Filled 2024-05-22: qty 5

## 2024-05-22 MED ORDER — SODIUM CHLORIDE 0.9 % IV SOLN
INTRAVENOUS | Status: DC
  Filled 2024-05-22: qty 250

## 2024-05-22 MED ORDER — SODIUM CHLORIDE 0.9 % IV SOLN
5.0000 mg/kg | Freq: Once | INTRAVENOUS | Status: AC
  Administered 2024-05-22: 400 mg via INTRAVENOUS
  Filled 2024-05-22 (×2): qty 16

## 2024-05-22 MED ORDER — LEUCOVORIN CALCIUM INJECTION 350 MG
400.0000 mg/m2 | Freq: Once | INTRAVENOUS | Status: AC
  Administered 2024-05-22: 720 mg via INTRAVENOUS
  Filled 2024-05-22: qty 25

## 2024-05-22 NOTE — Progress Notes (Signed)
 [facility] Psychosocial Distress Screening Clinical Social Work  Carla Mckinney is a 63 y.o. year old female contacted by phone. Clinical Social Work was referred by nurse for positive distress screening. The patient scored a 8 on the Psychosocial Distress Thermometer which indicates moderate distress. Clinical Social Worker contacted patient by phone to assess for distress and other psychosocial needs.     Distress Screen:    05/19/2024   12:28 PM  ONCBCN DISTRESS SCREENING  How much distress have you been experiencing in the past week? (0-10) 8  Emotional concerns type Fear  Physical Concerns Type  Fatigue;Changes in eating     CSW and patient discussed common feeling and emotions when being diagnosed with cancer, and the importance of support during treatment.  CSW informed patient of the support team and support services at [facility].  CSW provided contact information and encouraged patient to call with any questions or concerns.  She stated her fear decreased after her chemo education class with Octavio Ben.  Her other concern was decreased appetite and fatigue, which fluctuate.  She reported having a good appetite today.   Follow Up Plan: CSW will follow-up with patient by phone  Patient verbalizes understanding of plan: Yes    [name], LCSW

## 2024-05-22 NOTE — Progress Notes (Signed)
 Hematology/Oncology Consult note [facility]  [phone] [phone]  Patient Care Team: Thersia Flax, MD as PCP - General (Internal Medicine) Marquita Situ Magali Schmitz, MD as Consulting Physician (General Surgery) Rochell Chroman, RN as Oncology Nurse Navigator Avonne Boettcher, MD as Consulting Physician (Oncology)   Name of the patient: Carla Mckinney  [id number]  1961/05/23   Date of visit: 05/22/24  Diagnosis-stage IV adenocarcinoma of the colon with liver metastases  Chief complaint/ Reason for visit-on treatment assessment prior to cycle 1 of palliative FOLFOX Avastin chemotherapy  Heme/Onc history: patient is a 63 year old female with a past medical history significant for hypercalcemia due to hyperparathyroidism, hyperlipidemia among other medical problems. She was noted to have bright red blood in her stools along with symptoms of unintentional weight loss which has been going on for the last 6 weeks.  She has lost about 20 pounds of weight.  Her last colonoscopy in 2018 was unremarkable.  Patient was seen by  GI and colonoscopy is planned a month from now.  Patient underwent CT abdomen and pelvis with contrast on 05/03/2024 for symptoms of right upper quadrant abdominal pain which showed large enhancing masses in the liver measuring up to 12.5 cm in the right hepatic lobe and 8.5 cm in the posterior right hepatic dome.  No evidence of intra-abdominal adenopathy.  This was followed by MRI abdomen with and without contrast which was ordered by me on 05/04/2024 which showed extremely bulky heterogeneously hypoenhancing masses occupying the majority of the right lobe of the liver measuring 14.9 x 12.9 x 9.1 cm.  Occasional incidental benign cyst throughout the liver.  Pancreas, spleen and adrenal glands and kidney appeared normal.  Stomach and intra-abdominal lymph nodes appeared normal.  Rectosigmoid mass was noted along with mesenteric  adenopathy concerning for primary origin of these metastases.  aFP was normal.  CEA was elevated at 305.  CA 19-9 elevated at 176.MMR stable.  Plan is to proceed with FOLFOX Avastin chemotherapy for first-line.  NGS testing currently pending    Interval history-right upper quadrant abdominal pain is presently better.  She has not required any pain medication so far.  ECOG PS- 1 Pain scale- 2 Opioid associated constipation- no  Review of systems- Review of Systems  Constitutional:  Negative for chills, fever, malaise/fatigue and weight loss.  HENT:  Negative for congestion, ear discharge and nosebleeds.   Eyes:  Negative for blurred vision.  Respiratory:  Negative for cough, hemoptysis, sputum production, shortness of breath and wheezing.   Cardiovascular:  Negative for chest pain, palpitations, orthopnea and claudication.  Gastrointestinal:  Negative for abdominal pain, blood in stool, constipation, diarrhea, heartburn, melena, nausea and vomiting.  Genitourinary:  Negative for dysuria, flank pain, frequency, hematuria and urgency.  Musculoskeletal:  Negative for back pain, joint pain and myalgias.  Skin:  Negative for rash.  Neurological:  Negative for dizziness, tingling, focal weakness, seizures, weakness and headaches.  Endo/Heme/Allergies:  Does not bruise/bleed easily.  Psychiatric/Behavioral:  Negative for depression and suicidal ideas. The patient does not have insomnia.       Allergies  Allergen Reactions   Lisinopril  Swelling     Past Medical History:  Diagnosis Date   Hypertension    Rheumatic fever 1986   has had echo since then. no cardiac issues.     Past Surgical History:  Procedure Laterality Date   ABDOMINAL HYSTERECTOMY     AUGMENTATION MAMMAPLASTY Bilateral    breast lift  CARDIAC CATHETERIZATION  2002   normal, due to syncope and abnormal myowiew ([place])   COLONOSCOPY WITH PROPOFOL  N/A 09/15/2017   Procedure: COLONOSCOPY WITH PROPOFOL ;  Surgeon:  Selena Daily, MD;  Location: [facility];  Service: Gastroenterology;  Laterality: N/A;   IR IMAGING GUIDED PORT INSERTION  05/11/2024   MASTOPEXY  2008   OOPHORECTOMY     secondary to scar tissue,  [facility]   RIGHT OOPHORECTOMY     TONSILLECTOMY     TUBAL LIGATION      Social History   Socioeconomic History   Marital status: Married    Spouse name: Not on file   Number of children: 2   Years of education: Not on file   Highest education level: Associate degree: academic program  Occupational History   Not on file  Tobacco Use   Smoking status: Never   Smokeless tobacco: Never  Vaping Use   Vaping status: Never Used  Substance and Sexual Activity   Alcohol use: Yes    Alcohol/week: 1.0 standard drink of alcohol    Types: 1 Glasses of wine per week    Comment: socially   Drug use: No   Sexual activity: Yes    Birth control/protection: None  Other Topics Concern   Not on file  Social History Narrative   Not on file   Social Drivers of Health   Financial Resource Strain: Low Risk  (11/16/2023)   Overall Financial Resource Strain (CARDIA)    Difficulty of Paying Living Expenses: Not hard at all  Food Insecurity: No Food Insecurity (05/05/2024)   Hunger Vital Sign    Worried About Running Out of Food in the Last Year: Never true    Ran Out of Food in the Last Year: Never true  Transportation Needs: No Transportation Needs (05/05/2024)   PRAPARE - Administrator, Civil Service (Medical): No    Lack of Transportation (Non-Medical): No  Physical Activity: Insufficiently Active (05/05/2024)   Exercise Vital Sign    Days of Exercise per Week: 2 days    Minutes of Exercise per Session: 20 min  Stress: No Stress Concern Present (11/16/2023)   [employer] of Occupational Health - Occupational Stress Questionnaire    Feeling of Stress : Not at all  Social Connections: Moderately Integrated (11/16/2023)   Social Connection and Isolation Panel  [NHANES]    Frequency of Communication with Friends and Family: More than three times a week    Frequency of Social Gatherings with Friends and Family: Twice a week    Attends Religious Services: More than 4 times per year    Active Member of [employer] or Organizations: No    Attends Engineer, structural: Not on file    Marital Status: Married  Catering manager Violence: Not At Risk (05/05/2024)   Humiliation, Afraid, Rape, and Kick questionnaire    Fear of Current or Ex-Partner: No    Emotionally Abused: No    Physically Abused: No    Sexually Abused: No    Family History  Problem Relation Age of Onset   Heart disease Mother    Heart failure Mother    Hypertension Father    Hyperlipidemia Father    Heart disease Maternal Grandfather    Heart disease Paternal Grandmother    Stroke Paternal Grandmother 53   Cancer Paternal Grandfather        multiple myeloma   Breast cancer Neg Hx      Current Outpatient  Medications:    acetaminophen (TYLENOL) 325 MG tablet, Take 650 mg by mouth every 6 (six) hours as needed., Disp: , Rfl:    amLODipine  (NORVASC ) 10 MG tablet, Take 1 tablet (10 mg total) by mouth daily., Disp: 90 tablet, Rfl: 1   aspirin 81 MG tablet, Take 81 mg by mouth daily., Disp: , Rfl:    cholecalciferol (VITAMIN D3) 25 MCG (1000 UNIT) tablet, Take 1,000 Units by mouth daily., Disp: , Rfl:    dexamethasone  (DECADRON ) 4 MG tablet, Take 2 tablets (8 mg total) by mouth daily. Start the day after chemotherapy for 2 days. Take with food., Disp: 30 tablet, Rfl: 1   diltiazem  (CARDIZEM ) 30 MG tablet, Take 1 tablet (30 mg total) by mouth as needed (rapid heart rate)., Disp: 90 tablet, Rfl: 2   furosemide  (LASIX ) 20 MG tablet, Take 1 tablet (20 mg total) by mouth daily as needed., Disp: 90 tablet, Rfl: 1   lidocaine -prilocaine  (EMLA ) cream, Apply to affected area once, Disp: 30 g, Rfl: 3   metoprolol  succinate (TOPROL -XL) 100 MG 24 hr tablet, Take 1 tablet (100 mg total) by  mouth daily., Disp: 90 tablet, Rfl: 1   ondansetron  (ZOFRAN ) 8 MG tablet, Take 1 tablet (8 mg total) by mouth every 8 (eight) hours as needed for nausea or vomiting. Start on the third day after chemotherapy., Disp: 30 tablet, Rfl: 1   oxyCODONE  (OXY IR/ROXICODONE ) 5 MG immediate release tablet, Take 1 tablet (5 mg total) by mouth every 4 (four) hours as needed for severe pain (pain score 7-10)., Disp: 120 tablet, Rfl: 0   prochlorperazine  (COMPAZINE ) 10 MG tablet, Take 1 tablet (10 mg total) by mouth every 6 (six) hours as needed for nausea or vomiting., Disp: 30 tablet, Rfl: 1 No current facility-administered medications for this visit.  Facility-Administered Medications Ordered in Other Visits:    0.9 %  sodium chloride  infusion, , Intravenous, Continuous, Avonne Boettcher, MD, Last Rate: 10 mL/hr at 05/22/24 1049, New Bag at 05/22/24 1049   dextrose 5 % solution, , Intravenous, Continuous, Avonne Boettcher, MD, Last Rate: 10 mL/hr at 05/22/24 1156, New Bag at 05/22/24 1156   fluorouracil (ADRUCIL) 4,300 mg in sodium chloride  0.9 % 64 mL chemo infusion, 2,400 mg/m2 (Treatment Plan Recorded), Intravenous, 1 day or 1 dose, Avonne Boettcher, MD   fluorouracil (ADRUCIL) chemo injection 700 mg, 400 mg/m2 (Treatment Plan Recorded), Intravenous, Once, Avonne Boettcher, MD   leucovorin 720 mg in dextrose 5 % 250 mL infusion, 400 mg/m2 (Treatment Plan Recorded), Intravenous, Once, Avonne Boettcher, MD, Last Rate: 143 mL/hr at 05/22/24 1159, 720 mg at 05/22/24 1159   oxaliplatin (ELOXATIN) 150 mg in dextrose 5 % 500 mL chemo infusion, 85 mg/m2 (Treatment Plan Recorded), Intravenous, Once, Avonne Boettcher, MD, Last Rate: 265 mL/hr at 05/22/24 1202, 150 mg at 05/22/24 1202  Physical exam:  Vitals:   05/22/24 1001  BP: 111/71  Pulse: 72  Resp: 18  Temp: 97.8 F (36.6 C)  TempSrc: Tympanic  SpO2: 97%  Weight: 159 lb 12.8 oz (72.5 kg)  Height: 5\' 3"  (1.6 m)   Physical Exam Cardiovascular:     Rate and Rhythm:  Normal rate and regular rhythm.     Heart sounds: Normal heart sounds.  Pulmonary:     Effort: Pulmonary effort is normal.     Breath sounds: Normal breath sounds.  Skin:    General: Skin is warm and dry.  Neurological:     Mental  Status: She is alert and oriented to person, place, and time.      I have personally reviewed labs listed below:    Latest Ref Rng & Units 05/22/2024    9:29 AM  CMP  Glucose 70 - 99 mg/dL 829   BUN 6 - 20 mg/dL 13   Creatinine 5.62 - 1.00 mg/dL 1.30   Sodium 865 - 784 mmol/L 132   Potassium 3.5 - 5.1 mmol/L 3.7   Chloride 98 - 111 mmol/L 100   CO2 22 - 32 mmol/L 23   Calcium 8.9 - 10.3 mg/dL 9.8   Total Protein 6.5 - 8.1 g/dL 7.2   Total Bilirubin 0.0 - 1.2 mg/dL 0.8   Alkaline Phos 38 - 126 U/L 118   AST 15 - 41 U/L 40   ALT 0 - 44 U/L 26       Latest Ref Rng & Units 05/22/2024    9:29 AM  CBC  WBC 4.0 - 10.5 K/uL 8.0   Hemoglobin 12.0 - 15.0 g/dL 69.6   Hematocrit 29.5 - 46.0 % 38.8   Platelets 150 - 400 K/uL 375    I have personally reviewed Radiology images listed below: No images are attached to the encounter.  NM PET Image Initial (PI) Skull Base To Thigh Result Date: 05/16/2024 CLINICAL DATA:  Initial treatment strategy for colon cancer. EXAM: NUCLEAR MEDICINE PET SKULL BASE TO THIGH TECHNIQUE: 8.4 mCi F-18 FDG was injected intravenously. Full-ring PET imaging was performed from the skull base to thigh after the radiotracer. CT data was obtained and used for attenuation correction and anatomic localization. Fasting blood glucose: 88 mg/dl COMPARISON:  Multiple exams, including MRI 05/04/2024 FINDINGS: Mediastinal blood pool activity: SUV max 2.5 Liver activity: SUV max NA NECK: No significant abnormal hypermetabolic activity in this region. Incidental CT findings: None. CHEST: 0.5 cm left upper lobe pulmonary nodule on image 38 series 6 has a maximum SUV of 1.6, is technically too small to characterize by PET-CT. This nodule is not readily  appreciated on the prior exam from 01/04/2014. 4 by 3 mm right lower lobe nodule on image 58 series 6 is not appreciably metabolic but below sensitive PET-CT size thresholds. This nodule is stable from 01/04/2014 and hence considered benign. Incidental CT findings: Right Port-A-Cath tip: Cavoatrial junction. ABDOMEN/PELVIS: Two large hepatic metastatic lesions are present, this includes a centrally necrotic 8.7 cm mass centered in segment 7 (maximum SUV 16.7) and a 12.9 cm mass centered inferiorly in the right hepatic lobe with maximum SUV 12.9. Hypermetabolic circumferential mass involving a 5 cm segment of the sigmoid colon has maximum SUV 26.2, compatible with primary malignancy. Adjacent hypermetabolic sigmoid lymph nodes near the mass are observed measuring up to 0.9 cm with maximum SUV in these lymph nodes of 5.3, compatible with local nodal involvement. Incidental CT findings: None. SKELETON: No significant abnormal hypermetabolic activity in this region. Incidental CT findings: None. IMPRESSION: 1. Hypermetabolic circumferential mass involving a 5 cm segment of the sigmoid colon is compatible with primary malignancy. Adjacent hypermetabolic sigmoid lymph nodes near the mass are compatible with local nodal involvement. 2. Two large hepatic metastatic lesions are present, this includes a centrally necrotic 8.7 cm mass centered in segment 7 and a 12.9 cm mass centered inferiorly in the right hepatic lobe. 3. 0.5 cm left upper lobe pulmonary nodule has a maximum SUV of 1.6, is technically too small to characterize by PET-CT, and was not readily appreciable on the 01/04/2014 CT chest. This nodule  is too small for biopsy and remains indeterminate for malignancy. Surveillance CT imaging of the chest is recommended. Electronically Signed   By: Freida Jes M.D.   On: 05/16/2024 15:09   IR IMAGING GUIDED PORT INSERTION Result Date: 05/11/2024 INDICATION: 63 year old female referred for port catheter EXAM:  IMAGE GUIDED PORT CATHETER MEDICATIONS: None ANESTHESIA/SEDATION: Moderate (conscious) sedation was employed during this procedure. A total of Versed  1.5 mg and Fentanyl  75 mcg was administered intravenously. Moderate Sedation Time: 20 minutes. The patient's level of consciousness and vital signs were monitored continuously by radiology nursing throughout the procedure under my direct supervision. FLUOROSCOPY TIME:  Reference air kerma: 1.7 mGy COMPLICATIONS: None PROCEDURE: Informed written consent was obtained from the patient after a discussion of the risks, benefits, and alternatives to treatment. Questions regarding the procedure were encouraged and answered. The right neck and chest were prepped with chlorhexidine in a sterile fashion, and a sterile drape was applied covering the operative field. Maximum barrier sterile technique with sterile gowns and gloves were used for the procedure. A timeout was performed prior to the initiation of the procedure. Ultrasound survey was performed with images stored and sent to PACs. Right IJ vein documented to be patent. The right neck and chest was prepped with chlorhexidine, and draped in the usual sterile fashion using maximum barrier technique (cap and mask, sterile gown, sterile gloves, large sterile sheet, hand hygiene and cutaneous antiseptic). Local anesthesia was attained by infiltration with 1% lidocaine  without epinephrine. Ultrasound demonstrated patency of the right internal jugular vein, and this was documented with an image. Under real-time ultrasound guidance, this vein was accessed with a 21 gauge micropuncture needle and image documentation was performed. A small dermatotomy was made at the access site with an 11 scalpel. A 0.018" wire was advanced into the SVC and used to estimate the length of the internal catheter. The access needle exchanged for a 46F micropuncture vascular sheath. The 0.018" wire was then removed and a 0.035" wire advanced into the  IVC. An appropriate location for the subcutaneous reservoir was selected below the clavicle and an incision was made through the skin and underlying soft tissues. The subcutaneous tissues were then dissected using a combination of blunt and sharp surgical technique and a pocket was formed. A single lumen power injectable portacatheter was then tunneled through the subcutaneous tissues from the pocket to the dermatotomy and the port reservoir placed within the subcutaneous pocket. The venous access site was then serially dilated and a peel away vascular sheath placed over the wire. The wire was removed and the port catheter advanced into position under fluoroscopic guidance. The catheter tip is positioned in the cavoatrial junction. This was documented with a spot image. The portacatheter was then tested and found to flush and aspirate well. The port was flushed with saline followed by 100 units/mL heparinized saline. The pocket was then closed in two layers using first subdermal inverted interrupted absorbable sutures followed by a running subcuticular suture. The epidermis was then sealed with Dermabond. The dermatotomy at the venous access site was also seal with Dermabond. Patient tolerated the procedure well and remained hemodynamically stable throughout. No complications encountered and no significant blood loss encountered IMPRESSION: Status post image guided right IJ port catheter placement Signed, Marciano Settles. Rexine Cater, RPVI [facility] Electronically Signed   By: Myrlene Asper D.O.   On: 05/11/2024 13:36   MR ABDOMEN W WO CONTRAST Result Date: 05/04/2024 CLINICAL DATA:  Liver masses EXAM: MRI ABDOMEN WITHOUT AND WITH CONTRAST TECHNIQUE: Multiplanar multisequence MR imaging of the abdomen was performed both before and after the administration of intravenous contrast. CONTRAST:  7mL GADAVIST  GADOBUTROL  1 MMOL/ML IV SOLN COMPARISON:  CT abdomen  pelvis, 03/03/2024 FINDINGS: Lower chest: No acute abnormality. Hepatobiliary: Extremely bulky heterogeneously hypoenhancing masses occupying the majority of the right lobe of the liver, dominant mass measuring 14.9 x 12.9 x 9.1 cm (series 16, image 45, series 3, image 17). Occasional incidental, benign cysts scattered throughout the liver. No gallstones, gallbladder wall thickening, or biliary dilatation. Pancreas: Unremarkable. No pancreatic ductal dilatation or surrounding inflammatory changes. Spleen: Normal in size without significant abnormality. Adrenals/Urinary Tract: Adrenal glands are unremarkable. Kidneys are normal, without renal calculi, solid lesion, or hydronephrosis. Stomach/Bowel: Stomach is within normal limits. No evidence of bowel wall thickening, distention, or inflammatory changes. Vascular/Lymphatic: No significant vascular findings are present. No enlarged abdominal lymph nodes. Other: No abdominal wall hernia or abnormality. No ascites. Musculoskeletal: No acute or significant osseous findings. IMPRESSION: 1. Extremely bulky heterogeneously hypoenhancing masses occupying the majority of the right lobe of the liver, dominant mass measuring 14.9 x 12.9 x 9.1 cm. Findings are consistent with hepatic metastatic disease. No metastases within the left lobe. In retrospective review of CT dated 05/03/2024, there is a rectosigmoid colon mass with associated mesenteric lymphadenopathy, which is presumably the primary origin of these metastases. 2. No other evidence of lymphadenopathy or metastatic disease in the abdomen. Electronically Signed   By: Fredricka Jenny M.D.   On: 05/04/2024 21:25   US  ABDOMEN LIMITED RUQ (LIVER/GB) Result Date: 05/03/2024 CLINICAL DATA:  Right upper quadrant pain EXAM: ULTRASOUND ABDOMEN LIMITED RIGHT UPPER QUADRANT COMPARISON:  CT earlier today FINDINGS: Gallbladder: No gallstones or wall thickening visualized. No sonographic Murphy sign noted by sonographer. Common bile  duct: Diameter: Normal caliber, 3 mm Liver: Multiple heterogeneous solid masses, the largest measurable mass in the right hepatic lobe measuring up to 10.4 cm. These do not have a sonographic appearance of hemangiomas. Few scattered cysts, the largest 3.5 cm in the inferior right lobe. Portal vein is patent on color Doppler imaging with normal direction of blood flow towards the liver. Other: None. IMPRESSION: Multiple heterogeneous solid masses in the right hepatic lobe as seen on CT concerning for metastases or primary hepatocellular carcinoma. Recommend further evaluation with MRI. Electronically Signed   By: Janeece Mechanic M.D.   On: 05/03/2024 13:44   CT ABDOMEN PELVIS W CONTRAST Result Date: 05/03/2024 CLINICAL DATA:  Right abdominal pain EXAM: CT ABDOMEN AND PELVIS WITH CONTRAST TECHNIQUE: Multidetector CT imaging of the abdomen and pelvis was performed using the standard protocol following bolus administration of intravenous contrast. RADIATION DOSE REDUCTION: This exam was performed according to the departmental dose-optimization program which includes automated exposure control, adjustment of the mA and/or kV according to patient size and/or use of iterative reconstruction technique. CONTRAST:  100mL OMNIPAQUE  IOHEXOL  300 MG/ML  SOLN COMPARISON:  None Available. FINDINGS: Lower chest: No acute abnormality Hepatobiliary: Large enhancing masses within the liver measuring up to 12.5 cm in the right hepatic lobe on image 24 and 8.5 cm in the posterior right hepatic dome on image 16. Scattered water  density small cysts throughout the liver. Gallbladder unremarkable. No biliary ductal dilatation. Pancreas: No focal abnormality or ductal dilatation. Spleen: No focal abnormality.  Normal size. Adrenals/Urinary Tract: No adrenal abnormality. No focal renal abnormality. No stones or hydronephrosis. Urinary bladder is unremarkable. Stomach/Bowel: Normal appendix. Stomach, large and small  bowel grossly unremarkable.  Vascular/Lymphatic: No evidence of aneurysm or adenopathy. Reproductive: Prior hysterectomy.  No adnexal masses. Other: No free fluid or free air. Musculoskeletal: No acute bony abnormality or focal bone lesion. IMPRESSION: Large enhancing masses within the liver. Differential considerations would include metastases, primary hepatic malignancy or giant hemangiomas. Recommend further evaluation with MRI. Electronically Signed   By: Janeece Mechanic M.D.   On: 05/03/2024 13:09     Assessment and plan- Patient is a 63 y.o. female with stage IV adenocarcinoma of the sigmoid colon with liver metastases here for on treatment assessment prior to cycle 1 of palliative FOLFOX Avastin chemotherapy  Counts okay to proceed with cycle 1 of palliative FOLFOX chemotherapy today along with a Avastin.  Her urine protein shows 100 mg of protein.  This was prior to giving her a Avastin.  I am proceeding with a Avastin today and will check her urine protein again in 2 weeks time.  Blood pressure is stable to proceed with a Avastin today.  Discussed acetaminophens of chemotherapy including all but not limited to nausea vomiting low blood counts risk of infections and hospitalization.  Risk of thromboembolism, bleeding hypertension and proteinuria associated with Avastin.  Discussed peripheral neuropathy associated with oxaliplatin.  Treatment will be given with the palliative intent.  Patient understands and agrees to proceed as planned.  NGS testing is currently pending.  CEA will be checked again in 2 weeks time.  We will plan to repeat CT scans after 3 months and if she has a good response to treatment we will consider liver directed therapy for her large liver lesions as well.   Visit Diagnosis 1. Colon cancer metastasized to liver (HCC)   2. Encounter for antineoplastic chemotherapy   3. Encounter for monoclonal antibody treatment for malignancy      Dr. Seretha Dance, MD, MPH [facility] at [facility] [phone] 05/22/2024 12:34 PM

## 2024-05-22 NOTE — Patient Instructions (Signed)
 [facility] - A DEPT OF [facility]  Discharge Instructions: Thank you for choosing [facility] to provide your oncology and hematology care.  If you have a lab appointment with the Cancer Center, please go directly to the Cancer Center and check in at the registration area.  Wear comfortable clothing and clothing appropriate for easy access to any Portacath or PICC line.   We strive to give you quality time with your provider. You may need to reschedule your appointment if you arrive late (15 or more minutes).  Arriving late affects you and other patients whose appointments are after yours.  Also, if you miss three or more appointments without notifying the office, you may be dismissed from the clinic at the provider's discretion.      For prescription refill requests, have your pharmacy contact our office and allow 72 hours for refills to be completed.    Today you received the following chemotherapy and/or immunotherapy agents MVASI, LEUCOVORIN, OXALIPLATIN, 5 FU      To help prevent nausea and vomiting after your treatment, we encourage you to take your nausea medication as directed.  BELOW ARE SYMPTOMS THAT SHOULD BE REPORTED IMMEDIATELY: *FEVER GREATER THAN 100.4 F (38 C) OR HIGHER *CHILLS OR SWEATING *NAUSEA AND VOMITING THAT IS NOT CONTROLLED WITH YOUR NAUSEA MEDICATION *UNUSUAL SHORTNESS OF BREATH *UNUSUAL BRUISING OR BLEEDING *URINARY PROBLEMS (pain or burning when urinating, or frequent urination) *BOWEL PROBLEMS (unusual diarrhea, constipation, pain near the anus) TENDERNESS IN MOUTH AND THROAT WITH OR WITHOUT PRESENCE OF ULCERS (sore throat, sores in mouth, or a toothache) UNUSUAL RASH, SWELLING OR PAIN  UNUSUAL VAGINAL DISCHARGE OR ITCHING   Items with * indicate a potential emergency and should be followed up as soon as possible or go to the Emergency Department if any problems should occur.  Please show the CHEMOTHERAPY  ALERT CARD or IMMUNOTHERAPY ALERT CARD at check-in to the Emergency Department and triage nurse.  Should you have questions after your visit or need to cancel or reschedule your appointment, please contact [facility] - A DEPT OF [facility]  [phone] and follow the prompts.  Office hours are 8:00 a.m. to 4:30 p.m. Monday - Friday. Please note that voicemails left after 4:00 p.m. may not be returned until the following business day.  We are closed weekends and major holidays. You have access to a nurse at all times for urgent questions. Please call the main number to the clinic [phone] and follow the prompts.  For any non-urgent questions, you may also contact your provider using MyChart. We now offer e-Visits for anyone 58 and older to request care online for non-urgent symptoms. For details visit [email or web address].   Also download the MyChart app! Go to the app store, search "MyChart", open the app, select [facility], and log in with your MyChart username and password.  Bevacizumab Injection What is this medication? BEVACIZUMAB (be va SIZ yoo mab) treats some types of cancer. It works by blocking a protein that causes cancer cells to grow and multiply. This helps to slow or stop the spread of cancer cells. It is a monoclonal antibody. This medicine may be used for other purposes; ask your health care provider or pharmacist if you have questions. COMMON BRAND NAME(S): Alymsys, Avastin, MVASI, Vegzalma, Zirabev What should I tell my care team before I take this medication? They need to know if you have any  of these conditions: Blood clots Coughing up blood Having or recent surgery Heart failure High blood pressure History of a connection between 2 or more body parts that do not usually connect (fistula) History of a tear in your stomach or intestines Protein in your urine An unusual or allergic reaction to bevacizumab, other medications, foods,  dyes, or preservatives Pregnant or trying to get pregnant Breast-feeding How should I use this medication? This medication is injected into a vein. It is given by your care team in a hospital or clinic setting. Talk to your care team the use of this medication in children. Special care may be needed. Overdosage: If you think you have taken too much of this medicine contact a poison control center or emergency room at once. NOTE: This medicine is only for you. Do not share this medicine with others. What if I miss a dose? Keep appointments for follow-up doses. It is important not to miss your dose. Call your care team if you are unable to keep an appointment. What may interact with this medication? Interactions are not expected. This list may not describe all possible interactions. Give your health care provider a list of all the medicines, herbs, non-prescription drugs, or dietary supplements you use. Also tell them if you smoke, drink alcohol, or use illegal drugs. Some items may interact with your medicine. What should I watch for while using this medication? Your condition will be monitored carefully while you are receiving this medication. You may need blood work while taking this medication. This medication may make you feel generally unwell. This is not uncommon as chemotherapy can affect healthy cells as well as cancer cells. Report any side effects. Continue your course of treatment even though you feel ill unless your care team tells you to stop. This medication may increase your risk to bruise or bleed. Call your care team if you notice any unusual bleeding. Before having surgery, talk to your care team to make sure it is ok. This medication can increase the risk of poor healing of your surgical site or wound. You will need to stop this medication for 28 days before surgery. After surgery, wait at least 28 days before restarting this medication. Make sure the surgical site or wound is  healed enough before restarting this medication. Talk to your care team if questions. Talk to your care team if you may be pregnant. Serious birth defects can occur if you take this medication during pregnancy and for 6 months after the last dose. Contraception is recommended while taking this medication and for 6 months after the last dose. Your care team can help you find the option that works for you. Do not breastfeed while taking this medication and for 6 months after the last dose. This medication can cause infertility. Talk to your care team if you are concerned about your fertility. What side effects may I notice from receiving this medication? Side effects that you should report to your care team as soon as possible: Allergic reactions--skin rash, itching, hives, swelling of the face, lips, tongue, or throat Bleeding--bloody or black, tar-like stools, vomiting blood or brown material that looks like coffee grounds, red or dark brown urine, small red or purple spots on skin, unusual bruising or bleeding Blood clot--pain, swelling, or warmth in the leg, shortness of breath, chest pain Heart attack--pain or tightness in the chest, shoulders, arms, or jaw, nausea, shortness of breath, cold or clammy skin, feeling faint or lightheaded Heart failure--shortness of breath,  swelling of the ankles, feet, or hands, sudden weight gain, unusual weakness or fatigue Increase in blood pressure Infection--fever, chills, cough, sore throat, wounds that don't heal, pain or trouble when passing urine, general feeling of discomfort or being unwell Infusion reactions--chest pain, shortness of breath or trouble breathing, feeling faint or lightheaded Kidney injury--decrease in the amount of urine, swelling of the ankles, hands, or feet Stomach pain that is severe, does not go away, or gets worse Stroke--sudden numbness or weakness of the face, arm, or leg, trouble speaking, confusion, trouble walking, loss of  balance or coordination, dizziness, severe headache, change in vision Sudden and severe headache, confusion, change in vision, seizures, which may be signs of posterior reversible encephalopathy syndrome (PRES) Side effects that usually do not require medical attention (report to your care team if they continue or are bothersome): Back pain Change in taste Diarrhea Dry skin Increased tears Nosebleed This list may not describe all possible side effects. Call your doctor for medical advice about side effects. You may report side effects to FDA at 1-800-FDA-1088. Where should I keep my medication? This medication is given in a hospital or clinic. It will not be stored at home. NOTE: This sheet is a summary. It may not cover all possible information. If you have questions about this medicine, talk to your doctor, pharmacist, or health care provider.  2024 Elsevier/Gold Standard (2022-04-24 00:00:00)  Leucovorin Injection What is this medication? LEUCOVORIN (loo koe VOR in) prevents side effects from certain medications, such as methotrexate. It works by increasing folate levels. This helps protect healthy cells in your body. It may also be used to treat anemia caused by low levels of folate. It can also be used with fluorouracil, a type of chemotherapy, to treat colorectal cancer. It works by increasing the effects of fluorouracil in the body. This medicine may be used for other purposes; ask your health care provider or pharmacist if you have questions. What should I tell my care team before I take this medication? They need to know if you have any of these conditions: Anemia from low levels of vitamin B12 in the blood An unusual or allergic reaction to leucovorin, folic acid, other medications, foods, dyes, or preservatives Pregnant or trying to get pregnant Breastfeeding How should I use this medication? This medication is injected into a vein or a muscle. It is given by your care team in a  hospital or clinic setting. Talk to your care team about the use of this medication in children. Special care may be needed. Overdosage: If you think you have taken too much of this medicine contact a poison control center or emergency room at once. NOTE: This medicine is only for you. Do not share this medicine with others. What if I miss a dose? Keep appointments for follow-up doses. It is important not to miss your dose. Call your care team if you are unable to keep an appointment. What may interact with this medication? Capecitabine Fluorouracil Phenobarbital Phenytoin Primidone Trimethoprim;sulfamethoxazole This list may not describe all possible interactions. Give your health care provider a list of all the medicines, herbs, non-prescription drugs, or dietary supplements you use. Also tell them if you smoke, drink alcohol, or use illegal drugs. Some items may interact with your medicine. What should I watch for while using this medication? Your condition will be monitored carefully while you are receiving this medication. This medication may increase the side effects of 5-fluorouracil. Tell your care team if you have diarrhea  or mouth sores that do not get better or that get worse. What side effects may I notice from receiving this medication? Side effects that you should report to your care team as soon as possible: Allergic reactions--skin rash, itching, hives, swelling of the face, lips, tongue, or throat This list may not describe all possible side effects. Call your doctor for medical advice about side effects. You may report side effects to FDA at 1-800-FDA-1088. Where should I keep my medication? This medication is given in a hospital or clinic. It will not be stored at home. NOTE: This sheet is a summary. It may not cover all possible information. If you have questions about this medicine, talk to your doctor, pharmacist, or health care provider.  2024 Elsevier/Gold Standard  (2022-05-12 00:00:00)  Oxaliplatin Injection What is this medication? OXALIPLATIN (ox AL i PLA tin) treats colorectal cancer. It works by slowing down the growth of cancer cells. This medicine may be used for other purposes; ask your health care provider or pharmacist if you have questions. COMMON BRAND NAME(S): Eloxatin What should I tell my care team before I take this medication? They need to know if you have any of these conditions: Heart disease History of irregular heartbeat or rhythm Liver disease Low blood cell levels (white cells, red cells, and platelets) Lung or breathing disease, such as asthma Take medications that treat or prevent blood clots Tingling of the fingers, toes, or other nerve disorder An unusual or allergic reaction to oxaliplatin, other medications, foods, dyes, or preservatives If you or your partner are pregnant or trying to get pregnant Breast-feeding How should I use this medication? This medication is injected into a vein. It is given by your care team in a hospital or clinic setting. Talk to your care team about the use of this medication in children. Special care may be needed. Overdosage: If you think you have taken too much of this medicine contact a poison control center or emergency room at once. NOTE: This medicine is only for you. Do not share this medicine with others. What if I miss a dose? Keep appointments for follow-up doses. It is important not to miss a dose. Call your care team if you are unable to keep an appointment. What may interact with this medication? Do not take this medication with any of the following: Cisapride Dronedarone Pimozide Thioridazine This medication may also interact with the following: Aspirin and aspirin-like medications Certain medications that treat or prevent blood clots, such as warfarin, apixaban, dabigatran, and rivaroxaban Cisplatin Cyclosporine Diuretics Medications for infection, such as acyclovir,  adefovir, amphotericin B, bacitracin, cidofovir, foscarnet, ganciclovir, gentamicin , pentamidine, vancomycin NSAIDs, medications for pain and inflammation, such as ibuprofen or naproxen Other medications that cause heart rhythm changes Pamidronate Zoledronic acid This list may not describe all possible interactions. Give your health care provider a list of all the medicines, herbs, non-prescription drugs, or dietary supplements you use. Also tell them if you smoke, drink alcohol, or use illegal drugs. Some items may interact with your medicine. What should I watch for while using this medication? Your condition will be monitored carefully while you are receiving this medication. You may need blood work while taking this medication. This medication may make you feel generally unwell. This is not uncommon as chemotherapy can affect healthy cells as well as cancer cells. Report any side effects. Continue your course of treatment even though you feel ill unless your care team tells you to stop. This medication may increase  your risk of getting an infection. Call your care team for advice if you get a fever, chills, sore throat, or other symptoms of a cold or flu. Do not treat yourself. Try to avoid being around people who are sick. Avoid taking medications that contain aspirin, acetaminophen, ibuprofen, naproxen, or ketoprofen unless instructed by your care team. These medications may hide a fever. Be careful brushing or flossing your teeth or using a toothpick because you may get an infection or bleed more easily. If you have any dental work done, tell your dentist you are receiving this medication. This medication can make you more sensitive to cold. Do not drink cold drinks or use ice. Cover exposed skin before coming in contact with cold temperatures or cold objects. When out in cold weather wear warm clothing and cover your mouth and nose to warm the air that goes into your lungs. Tell your care team  if you get sensitive to the cold. Talk to your care team if you or your partner are pregnant or think either of you might be pregnant. This medication can cause serious birth defects if taken during pregnancy and for 9 months after the last dose. A negative pregnancy test is required before starting this medication. A reliable form of contraception is recommended while taking this medication and for 9 months after the last dose. Talk to your care team about effective forms of contraception. Do not father a child while taking this medication and for 6 months after the last dose. Use a condom while having sex during this time period. Do not breastfeed while taking this medication and for 3 months after the last dose. This medication may cause infertility. Talk to your care team if you are concerned about your fertility. What side effects may I notice from receiving this medication? Side effects that you should report to your care team as soon as possible: Allergic reactions--skin rash, itching, hives, swelling of the face, lips, tongue, or throat Bleeding--bloody or black, tar-like stools, vomiting blood or brown material that looks like coffee grounds, red or dark brown urine, small red or purple spots on skin, unusual bruising or bleeding Dry cough, shortness of breath or trouble breathing Heart rhythm changes--fast or irregular heartbeat, dizziness, feeling faint or lightheaded, chest pain, trouble breathing Infection--fever, chills, cough, sore throat, wounds that don't heal, pain or trouble when passing urine, general feeling of discomfort or being unwell Liver injury--right upper belly pain, loss of appetite, nausea, light-colored stool, dark yellow or brown urine, yellowing skin or eyes, unusual weakness or fatigue Low red blood cell level--unusual weakness or fatigue, dizziness, headache, trouble breathing Muscle injury--unusual weakness or fatigue, muscle pain, dark yellow or brown urine,  decrease in amount of urine Pain, tingling, or numbness in the hands or feet Sudden and severe headache, confusion, change in vision, seizures, which may be signs of posterior reversible encephalopathy syndrome (PRES) Unusual bruising or bleeding Side effects that usually do not require medical attention (report to your care team if they continue or are bothersome): Diarrhea Nausea Pain, redness, or swelling with sores inside the mouth or throat Unusual weakness or fatigue Vomiting This list may not describe all possible side effects. Call your doctor for medical advice about side effects. You may report side effects to FDA at 1-800-FDA-1088. Where should I keep my medication? This medication is given in a hospital or clinic. It will not be stored at home. NOTE: This sheet is a summary. It may not cover all possible  information. If you have questions about this medicine, talk to your doctor, pharmacist, or health care provider.  2024 Elsevier/Gold Standard (2023-11-19 00:00:00)  Fluorouracil Injection What is this medication? FLUOROURACIL (flure oh YOOR a sil) treats some types of cancer. It works by slowing down the growth of cancer cells. This medicine may be used for other purposes; ask your health care provider or pharmacist if you have questions. COMMON BRAND NAME(S): Adrucil What should I tell my care team before I take this medication? They need to know if you have any of these conditions: Blood disorders Dihydropyrimidine dehydrogenase (DPD) deficiency Infection, such as chickenpox, cold sores, herpes Kidney disease Liver disease Poor nutrition Recent or ongoing radiation therapy An unusual or allergic reaction to fluorouracil, other medications, foods, dyes, or preservatives If you or your partner are pregnant or trying to get pregnant Breast-feeding How should I use this medication? This medication is injected into a vein. It is administered by your care team in a  hospital or clinic setting. Talk to your care team about the use of this medication in children. Special care may be needed. Overdosage: If you think you have taken too much of this medicine contact a poison control center or emergency room at once. NOTE: This medicine is only for you. Do not share this medicine with others. What if I miss a dose? Keep appointments for follow-up doses. It is important not to miss your dose. Call your care team if you are unable to keep an appointment. What may interact with this medication? Do not take this medication with any of the following: Live virus vaccines This medication may also interact with the following: Medications that treat or prevent blood clots, such as warfarin, enoxaparin, dalteparin This list may not describe all possible interactions. Give your health care provider a list of all the medicines, herbs, non-prescription drugs, or dietary supplements you use. Also tell them if you smoke, drink alcohol, or use illegal drugs. Some items may interact with your medicine. What should I watch for while using this medication? Your condition will be monitored carefully while you are receiving this medication. This medication may make you feel generally unwell. This is not uncommon as chemotherapy can affect healthy cells as well as cancer cells. Report any side effects. Continue your course of treatment even though you feel ill unless your care team tells you to stop. In some cases, you may be given additional medications to help with side effects. Follow all directions for their use. This medication may increase your risk of getting an infection. Call your care team for advice if you get a fever, chills, sore throat, or other symptoms of a cold or flu. Do not treat yourself. Try to avoid being around people who are sick. This medication may increase your risk to bruise or bleed. Call your care team if you notice any unusual bleeding. Be careful brushing  or flossing your teeth or using a toothpick because you may get an infection or bleed more easily. If you have any dental work done, tell your dentist you are receiving this medication. Avoid taking medications that contain aspirin, acetaminophen, ibuprofen, naproxen, or ketoprofen unless instructed by your care team. These medications may hide a fever. Do not treat diarrhea with over the counter products. Contact your care team if you have diarrhea that lasts more than 2 days or if it is severe and watery. This medication can make you more sensitive to the sun. Keep out of the sun.  If you cannot avoid being in the sun, wear protective clothing and sunscreen. Do not use sun lamps, tanning beds, or tanning booths. Talk to your care team if you or your partner wish to become pregnant or think you might be pregnant. This medication can cause serious birth defects if taken during pregnancy and for 3 months after the last dose. A reliable form of contraception is recommended while taking this medication and for 3 months after the last dose. Talk to your care team about effective forms of contraception. Do not father a child while taking this medication and for 3 months after the last dose. Use a condom while having sex during this time period. Do not breastfeed while taking this medication. This medication may cause infertility. Talk to your care team if you are concerned about your fertility. What side effects may I notice from receiving this medication? Side effects that you should report to your care team as soon as possible: Allergic reactions--skin rash, itching, hives, swelling of the face, lips, tongue, or throat Heart attack--pain or tightness in the chest, shoulders, arms, or jaw, nausea, shortness of breath, cold or clammy skin, feeling faint or lightheaded Heart failure--shortness of breath, swelling of the ankles, feet, or hands, sudden weight gain, unusual weakness or fatigue Heart rhythm  changes--fast or irregular heartbeat, dizziness, feeling faint or lightheaded, chest pain, trouble breathing High ammonia level--unusual weakness or fatigue, confusion, loss of appetite, nausea, vomiting, seizures Infection--fever, chills, cough, sore throat, wounds that don't heal, pain or trouble when passing urine, general feeling of discomfort or being unwell Low red blood cell level--unusual weakness or fatigue, dizziness, headache, trouble breathing Pain, tingling, or numbness in the hands or feet, muscle weakness, change in vision, confusion or trouble speaking, loss of balance or coordination, trouble walking, seizures Redness, swelling, and blistering of the skin over hands and feet Severe or prolonged diarrhea Unusual bruising or bleeding Side effects that usually do not require medical attention (report to your care team if they continue or are bothersome): Dry skin Headache Increased tears Nausea Pain, redness, or swelling with sores inside the mouth or throat Sensitivity to light Vomiting This list may not describe all possible side effects. Call your doctor for medical advice about side effects. You may report side effects to FDA at 1-800-FDA-1088. Where should I keep my medication? This medication is given in a hospital or clinic. It will not be stored at home. NOTE: This sheet is a summary. It may not cover all possible information. If you have questions about this medicine, talk to your doctor, pharmacist, or health care provider.  2024 [employer]vier/Gold Standard (2022-04-14 00:00:00)

## 2024-05-22 NOTE — Progress Notes (Signed)
 Patient reports she still has a lot of blood and mucus occasionally in her stools.

## 2024-05-23 ENCOUNTER — Telehealth: Payer: Self-pay

## 2024-05-23 NOTE — Telephone Encounter (Signed)
Telephone call to patient for follow up after receiving first infusion.   Patient states infusion went great.  States eating good and drinking plenty of fluids.   Denies any nausea or vomiting.  Encouraged patient to call for any concerns or questions. 

## 2024-05-24 ENCOUNTER — Inpatient Hospital Stay

## 2024-05-24 VITALS — BP 127/89 | HR 80 | Temp 99.0°F | Resp 18

## 2024-05-24 DIAGNOSIS — C787 Secondary malignant neoplasm of liver and intrahepatic bile duct: Secondary | ICD-10-CM

## 2024-05-24 DIAGNOSIS — Z5112 Encounter for antineoplastic immunotherapy: Secondary | ICD-10-CM | POA: Diagnosis not present

## 2024-05-24 MED ORDER — SODIUM CHLORIDE 0.9% FLUSH
10.0000 mL | INTRAVENOUS | Status: DC | PRN
  Administered 2024-05-24: 10 mL
  Filled 2024-05-24: qty 10

## 2024-05-24 MED ORDER — HEPARIN SOD (PORK) LOCK FLUSH 100 UNIT/ML IV SOLN
500.0000 [IU] | Freq: Once | INTRAVENOUS | Status: AC | PRN
  Administered 2024-05-24: 500 [IU]
  Filled 2024-05-24: qty 5

## 2024-05-26 ENCOUNTER — Other Ambulatory Visit: Payer: Self-pay

## 2024-05-26 ENCOUNTER — Encounter: Payer: Self-pay | Admitting: Oncology

## 2024-05-26 ENCOUNTER — Telehealth: Payer: Self-pay | Admitting: *Deleted

## 2024-05-26 MED ORDER — PANTOPRAZOLE SODIUM 20 MG PO TBEC
20.0000 mg | DELAYED_RELEASE_TABLET | Freq: Every day | ORAL | 3 refills | Status: DC
  Filled 2024-05-26: qty 30, 30d supply, fill #0
  Filled 2024-06-24: qty 30, 30d supply, fill #1
  Filled 2024-07-24: qty 30, 30d supply, fill #2
  Filled 2024-08-24: qty 30, 30d supply, fill #3

## 2024-05-26 NOTE — Telephone Encounter (Signed)
 I completed the FMLA today and faxed it to [employer] and it went through with good transmission.

## 2024-05-28 ENCOUNTER — Encounter: Payer: Self-pay | Admitting: Internal Medicine

## 2024-05-28 DIAGNOSIS — C189 Malignant neoplasm of colon, unspecified: Secondary | ICD-10-CM | POA: Diagnosis not present

## 2024-05-28 NOTE — Progress Notes (Signed)
 Patient called with increasing frequency of urination, dysuria.  Low-grade temperature-recommend antibiotic, patient reluctant.  I think it is reasonable to continue hydration for now-if not improved recommend further evaluation for UTI.  Patient will call cancer center tomorrow for an update. Informed Dr. Randy Buttery.

## 2024-05-29 ENCOUNTER — Encounter: Payer: Self-pay | Admitting: Emergency Medicine

## 2024-05-29 ENCOUNTER — Emergency Department
Admission: EM | Admit: 2024-05-29 | Discharge: 2024-05-29 | Disposition: A | Discharge status: Home / Self Care | Attending: Emergency Medicine | Admitting: Emergency Medicine

## 2024-05-29 ENCOUNTER — Other Ambulatory Visit: Payer: Self-pay

## 2024-05-29 ENCOUNTER — Emergency Department

## 2024-05-29 DIAGNOSIS — N390 Urinary tract infection, site not specified: Secondary | ICD-10-CM | POA: Insufficient documentation

## 2024-05-29 DIAGNOSIS — R509 Fever, unspecified: Secondary | ICD-10-CM | POA: Diagnosis not present

## 2024-05-29 DIAGNOSIS — Z85118 Personal history of other malignant neoplasm of bronchus and lung: Secondary | ICD-10-CM | POA: Diagnosis not present

## 2024-05-29 LAB — COMPREHENSIVE METABOLIC PANEL WITH GFR
ALT: 30 U/L (ref 0–44)
AST: 39 U/L (ref 15–41)
Albumin: 3.6 g/dL (ref 3.5–5.0)
Alkaline Phosphatase: 98 U/L (ref 38–126)
Anion gap: 8 (ref 5–15)
BUN: 10 mg/dL (ref 6–20)
CO2: 23 mmol/L (ref 22–32)
Calcium: 10 mg/dL (ref 8.9–10.3)
Chloride: 100 mmol/L (ref 98–111)
Creatinine, Ser: 0.65 mg/dL (ref 0.44–1.00)
GFR, Estimated: >60 mL/min (ref >60)
Glucose, Bld: 109 mg/dL — ABNORMAL HIGH (ref 70–99)
Potassium: 3.8 mmol/L (ref 3.5–5.1)
Sodium: 131 mmol/L — ABNORMAL LOW (ref 135–145)
Total Bilirubin: 1.4 mg/dL — ABNORMAL HIGH (ref 0.0–1.2)
Total Protein: 7.2 g/dL (ref 6.5–8.1)

## 2024-05-29 LAB — URINALYSIS, W/ REFLEX TO CULTURE (INFECTION SUSPECTED)
Bilirubin Urine: NEGATIVE
Glucose, UA: NEGATIVE mg/dL
Hgb urine dipstick: NEGATIVE
Ketones, ur: NEGATIVE mg/dL
Nitrite: NEGATIVE
Protein, ur: NEGATIVE mg/dL
Specific Gravity, Urine: 1.012 (ref 1.005–1.030)
pH: 8 (ref 5.0–8.0)

## 2024-05-29 LAB — CBC WITH DIFFERENTIAL/PLATELET
Abs Immature Granulocytes: 0.03 10*3/uL (ref 0.00–0.07)
Basophils Absolute: 0 10*3/uL (ref 0.0–0.1)
Basophils Relative: 1 %
Eosinophils Absolute: 0.5 10*3/uL (ref 0.0–0.5)
Eosinophils Relative: 9 %
HCT: 38.7 % (ref 36.0–46.0)
Hemoglobin: 13 g/dL (ref 12.0–15.0)
Immature Granulocytes: 1 %
Lymphocytes Relative: 25 %
Lymphs Abs: 1.4 10*3/uL (ref 0.7–4.0)
MCH: 30.6 pg (ref 26.0–34.0)
MCHC: 33.6 g/dL (ref 30.0–36.0)
MCV: 91.1 fL (ref 80.0–100.0)
Monocytes Absolute: 0.4 10*3/uL (ref 0.1–1.0)
Monocytes Relative: 7 %
Neutro Abs: 3.2 10*3/uL (ref 1.7–7.7)
Neutrophils Relative %: 57 %
Platelets: 252 10*3/uL (ref 150–400)
RBC: 4.25 MIL/uL (ref 3.87–5.11)
RDW: 12.2 % (ref 11.5–15.5)
WBC: 5.5 10*3/uL (ref 4.0–10.5)
nRBC: 0 % (ref 0.0–0.2)

## 2024-05-29 LAB — LACTIC ACID, PLASMA: Lactic Acid, Venous: 1 mmol/L (ref 0.5–1.9)

## 2024-05-29 LAB — RESP PANEL BY RT-PCR (RSV, FLU A&B, COVID)  RVPGX2
Influenza A by PCR: NEGATIVE
Influenza B by PCR: NEGATIVE
Resp Syncytial Virus by PCR: NEGATIVE
SARS Coronavirus 2 by RT PCR: NEGATIVE

## 2024-05-29 MED ORDER — CEFPODOXIME PROXETIL 200 MG PO TABS
200.0000 mg | ORAL_TABLET | Freq: Two times a day (BID) | ORAL | 0 refills | Status: DC
  Filled 2024-05-29: qty 14, 7d supply, fill #0

## 2024-05-29 MED ORDER — SODIUM CHLORIDE 0.9 % IV SOLN
2.0000 g | Freq: Once | INTRAVENOUS | Status: AC
  Administered 2024-05-29: 2 g via INTRAVENOUS
  Filled 2024-05-29: qty 20

## 2024-05-29 NOTE — ED Provider Notes (Signed)
 [facility] Provider Note    Event Date/Time   First MD Initiated Contact with Patient 05/29/24 [phone]     (approximate)   History   Fever   HPI  Carla Mckinney is a 63 y.o. female with adenocarcinoma of the colon with metastases currently on palliative chemotherapy.  Patient had chemotherapy last Monday.  About 2 days ago began experiencing discomfort with urination.  No nausea or vomiting.  She has had low-grade fevers.  She reports yesterday she noticed discomfort with urination.  She had a little bit of discomfort in the lower abdomen which has resolved.  Patient discussed with oncologist on-call, comes for evaluation as she noticed that she did have at least 1 fever over 100.4.     Physical Exam   Triage Vital Signs: ED Triage Vitals  Encounter Vitals Group     BP 05/29/24 0301 122/83     Systolic BP Percentile --      Diastolic BP Percentile --      Pulse Rate 05/29/24 0301 82     Resp 05/29/24 0301 17     Temp 05/29/24 0301 99.3 F (37.4 C)     Temp Source 05/29/24 0301 Oral     SpO2 05/29/24 0301 96 %     Weight 05/29/24 0302 159 lb 12.8 oz (72.5 kg)     Height 05/29/24 0302 5\' 3"  (1.6 m)     Head Circumference --      Peak Flow --      Pain Score 05/29/24 0302 0     Pain Loc --      Pain Education --      Exclude from Growth Chart --     Most recent vital signs: Vitals:   05/29/24 0301  BP: 122/83  Pulse: 82  Resp: 17  Temp: 99.3 F (37.4 C)  SpO2: 96%     General: Awake, no distress.  Very pleasant. CV:  Good peripheral perfusion.  Normal tones and rate Resp:  Normal effort.  Clear lungs Abd:  No distention.  Soft nontender nondistended.  Patient reports she did have some element of lower abdominal pain yesterday but that has resolved.  No costovertebral angle tenderness bilaterally Other:  Warm well-perfused.  Fully alert well-oriented without acute distress or extremis noted   ED Results / Procedures / Treatments    Labs (all labs ordered are listed, but only abnormal results are displayed) Labs Reviewed  COMPREHENSIVE METABOLIC PANEL WITH GFR - Abnormal; Notable for the following components:      Result Value   Sodium 131 (*)    Glucose, Bld 109 (*)    Total Bilirubin 1.4 (*)    All other components within normal limits  URINALYSIS, W/ REFLEX TO CULTURE (INFECTION SUSPECTED) - Abnormal; Notable for the following components:   Color, Urine YELLOW (*)    APPearance CLEAR (*)    Leukocytes,Ua TRACE (*)    Bacteria, UA RARE (*)    All other components within normal limits  CULTURE, BLOOD (ROUTINE X 2)  RESP PANEL BY RT-PCR (RSV, FLU A&B, COVID)  [id number]  CULTURE, BLOOD (ROUTINE X 2)  URINE CULTURE  CBC WITH DIFFERENTIAL/PLATELET  LACTIC ACID, PLASMA  LACTIC ACID, PLASMA     EKG     RADIOLOGY   Chest x-ray is interpreted by me as negative for acute finding    PROCEDURES:  Critical Care performed: No  Procedures   MEDICATIONS ORDERED IN ED: Medications  cefTRIAXone (  ROCEPHIN) 2 g in sodium chloride  0.9 % 100 mL IVPB (2 g Intravenous New Bag/Given 05/29/24 0509)     IMPRESSION / MDM / ASSESSMENT AND PLAN / ED COURSE  I reviewed the triage vital signs and the nursing notes.                              Differential diagnosis includes, but is not limited to, based upon her symptomatology accompanying dysuria yesterday fevers and at this time very reassuring examination likely urinary tract infection.  She does not have features highly suggestive of pyelonephritis or other acute intra-abdominal cause of/nephrolithiasis at this time.  She has a very reassuring exam abdomen soft nontender nondistended.  She has no pulmonary symptoms no rash tick bites.  She has no associated headache or other symptomatology  Her lab work is very reassuring without evidence of sepsis.  Her vital signs are normal with exception of a fever of 100.1 Fahrenheit which I personally checked while examining  her.  Patient's presentation is most consistent with acute complicated illness / injury requiring diagnostic workup.    I do not believe that abdominal imaging would be of benefit at this time given the presenting symptoms and evaluation.  Here urinalysis is suggestive of likely mild urinary faction sent for culture.  Given Rocephin, discussed with her oncologist, Dr. B Clinical Course as of 05/29/24 0519  Mon May 29, 2024  0442 Case discussed with Dr. Andria Banks [MQ]    Clinical Course User Index [MQ] Iver Marker, MD   Dr. B reviewed clinical history and labs with me.  He recommends antibiotic treatment and will notify Dr. Marinda Si to assure close follow-up.  I discussed this with the patient and her family they are very agreeable.  Patient will be given prescription for antibiotic, as needed Zofran  not currently, and recommendations for close follow-up.  Did review careful and cautious return precautions with the patient as well who is in agreement  FINAL CLINICAL IMPRESSION(S) / ED DIAGNOSES   Final diagnoses:  Lower urinary tract infection, acute     Rx / DC Orders   ED Discharge Orders          Ordered    cefpodoxime (VANTIN) 200 MG tablet  2 times daily        05/29/24 0519             Note:  This document was prepared using Dragon voice recognition software and may include unintentional dictation errors.   Iver Marker, MD 05/29/24 [phone]

## 2024-05-29 NOTE — ED Triage Notes (Addendum)
 To ER with report of fever for past 2 days. Some blood in urine. Had a port placed 2 weeks ago. Tylenol last taken 6 hours ago. Max fever at home 102.5.  Chemo on Monday for colon cancer.

## 2024-05-29 NOTE — Progress Notes (Signed)
 Patient evaluated in the emergency room for possible UTI- discharged on oral cephalosporin.

## 2024-05-30 ENCOUNTER — Other Ambulatory Visit: Payer: Self-pay | Admitting: *Deleted

## 2024-05-30 ENCOUNTER — Inpatient Hospital Stay

## 2024-05-30 ENCOUNTER — Encounter: Payer: Self-pay | Admitting: Oncology

## 2024-05-30 ENCOUNTER — Ambulatory Visit

## 2024-05-30 ENCOUNTER — Inpatient Hospital Stay (HOSPITAL_BASED_OUTPATIENT_CLINIC_OR_DEPARTMENT_OTHER): Admitting: Hospice and Palliative Medicine

## 2024-05-30 ENCOUNTER — Other Ambulatory Visit: Payer: Self-pay

## 2024-05-30 ENCOUNTER — Encounter: Payer: Self-pay | Admitting: Hospice and Palliative Medicine

## 2024-05-30 VITALS — BP 132/75 | HR 75 | Temp 99.7°F | Resp 20

## 2024-05-30 VITALS — BP 122/67 | HR 81 | Temp 101.1°F | Resp 20 | Wt 159.4 lb

## 2024-05-30 DIAGNOSIS — C189 Malignant neoplasm of colon, unspecified: Secondary | ICD-10-CM | POA: Diagnosis not present

## 2024-05-30 DIAGNOSIS — Z5112 Encounter for antineoplastic immunotherapy: Secondary | ICD-10-CM | POA: Diagnosis not present

## 2024-05-30 DIAGNOSIS — R509 Fever, unspecified: Secondary | ICD-10-CM

## 2024-05-30 DIAGNOSIS — C787 Secondary malignant neoplasm of liver and intrahepatic bile duct: Secondary | ICD-10-CM | POA: Diagnosis not present

## 2024-05-30 DIAGNOSIS — E871 Hypo-osmolality and hyponatremia: Secondary | ICD-10-CM

## 2024-05-30 LAB — TSH: TSH: 2.312 u[IU]/mL (ref 0.350–4.500)

## 2024-05-30 LAB — CMP (CANCER CENTER ONLY)
ALT: 28 U/L (ref 0–44)
AST: 34 U/L (ref 15–41)
Albumin: 3.7 g/dL (ref 3.5–5.0)
Alkaline Phosphatase: 101 U/L (ref 38–126)
Anion gap: 10 (ref 5–15)
BUN: 12 mg/dL (ref 6–20)
CO2: 20 mmol/L — ABNORMAL LOW (ref 22–32)
Calcium: 9.4 mg/dL (ref 8.9–10.3)
Chloride: 99 mmol/L (ref 98–111)
Creatinine: 0.62 mg/dL (ref 0.44–1.00)
GFR, Estimated: >60 mL/min (ref >60)
Glucose, Bld: 101 mg/dL — ABNORMAL HIGH (ref 70–99)
Potassium: 3.6 mmol/L (ref 3.5–5.1)
Sodium: 129 mmol/L — ABNORMAL LOW (ref 135–145)
Total Bilirubin: 1 mg/dL (ref 0.0–1.2)
Total Protein: 7.2 g/dL (ref 6.5–8.1)

## 2024-05-30 LAB — OSMOLALITY, URINE: Osmolality, Ur: 332 mosm/kg (ref 300–900)

## 2024-05-30 LAB — CBC WITH DIFFERENTIAL (CANCER CENTER ONLY)
Abs Immature Granulocytes: 0.03 10*3/uL (ref 0.00–0.07)
Basophils Absolute: 0 10*3/uL (ref 0.0–0.1)
Basophils Relative: 0 %
Eosinophils Absolute: 0.6 10*3/uL — ABNORMAL HIGH (ref 0.0–0.5)
Eosinophils Relative: 7 %
HCT: 34.4 % — ABNORMAL LOW (ref 36.0–46.0)
Hemoglobin: 11.6 g/dL — ABNORMAL LOW (ref 12.0–15.0)
Immature Granulocytes: 0 %
Lymphocytes Relative: 19 %
Lymphs Abs: 1.5 10*3/uL (ref 0.7–4.0)
MCH: 30.6 pg (ref 26.0–34.0)
MCHC: 33.7 g/dL (ref 30.0–36.0)
MCV: 90.8 fL (ref 80.0–100.0)
Monocytes Absolute: 0.6 10*3/uL (ref 0.1–1.0)
Monocytes Relative: 9 %
Neutro Abs: 4.8 10*3/uL (ref 1.7–7.7)
Neutrophils Relative %: 65 %
Platelet Count: 252 10*3/uL (ref 150–400)
RBC: 3.79 MIL/uL — ABNORMAL LOW (ref 3.87–5.11)
RDW: 12.3 % (ref 11.5–15.5)
WBC Count: 7.6 10*3/uL (ref 4.0–10.5)
nRBC: 0 % (ref 0.0–0.2)

## 2024-05-30 LAB — SODIUM, URINE, RANDOM: Sodium, Ur: <10 mmol/L

## 2024-05-30 LAB — URINE CULTURE: Culture: <10000 — AB

## 2024-05-30 LAB — OSMOLALITY: Osmolality: 268 mosm/kg — ABNORMAL LOW (ref 275–295)

## 2024-05-30 MED ORDER — HEPARIN SOD (PORK) LOCK FLUSH 100 UNIT/ML IV SOLN
500.0000 [IU] | Freq: Once | INTRAVENOUS | Status: AC
  Administered 2024-05-30: 500 [IU] via INTRAVENOUS
  Filled 2024-05-30: qty 5

## 2024-05-30 MED ORDER — ALTEPLASE 2 MG IJ SOLR
2.0000 mg | Freq: Once | INTRAMUSCULAR | Status: AC
  Administered 2024-05-30: 2 mg via INTRAVENOUS_CENTRAL
  Filled 2024-05-30: qty 2

## 2024-05-30 MED ORDER — SODIUM CHLORIDE 0.9 % IV SOLN
INTRAVENOUS | Status: DC
  Filled 2024-05-30 (×2): qty 250

## 2024-05-30 MED ORDER — LEVOFLOXACIN 750 MG PO TABS
750.0000 mg | ORAL_TABLET | Freq: Every day | ORAL | 0 refills | Status: DC
  Filled 2024-05-30: qty 7, 7d supply, fill #0

## 2024-05-30 MED ORDER — LACTULOSE 10 GM/15ML PO SOLN
10.0000 g | Freq: Two times a day (BID) | ORAL | 0 refills | Status: DC | PRN
  Filled 2024-05-30: qty 236, 4d supply, fill #0

## 2024-05-30 NOTE — Progress Notes (Signed)
 Symptom Management Clinic [facility] [phone] [phone]  Patient Care Team: Thersia Flax, MD as PCP - General (Internal Medicine) Rochell Chroman, RN as Oncology Nurse Navigator Avonne Boettcher, MD as Consulting Physician (Oncology)   NAME OF PATIENT: Carla Mckinney  [id number]  [birth year]   DATE OF VISIT: 05/30/24  REASON FOR CONSULT: Carla Mckinney is a 63 y.o. female with multiple medical problems including stage IV adenocarcinoma of the colon with liver metastasis.   INTERVAL HISTORY: Patient received cycle 1 FOLFOX plus Bev on 05/22/2024  Patient presented emergency department 05/29/2024 with fever with dysuria.  No evidence of sepsis.  Workup was suggestive of UTI and patient discharged home on cefpodoxime.  She presents to [facility] today for follow-up.  Patient reports 4 days of intermittent fevers as high as 102.0.  She denies chills or myalgias/arthralgias.  Denies respiratory symptoms.  No nasal congestion or sore throat.  No ear pain.  Denies nausea or vomiting.  Does endorse recent heartburn but no chest pain.  Patient states that she has had dysuria but denies urinary frequency or urgency.  Has had constipation with the last good bowel movement about 5 days ago.  States that she had a small hardened stool today.  She is passing gas and denies abdominal distention.  Denies bleeding.  Denies rashes or tick bites.  Reports reduced oral intake.  Patient offers no further specific complaints today.   PAST MEDICAL HISTORY: Past Medical History:  Diagnosis Date   Hypertension    Rheumatic fever 1986   has had echo since then. no cardiac issues.    PAST SURGICAL HISTORY:  Past Surgical History:  Procedure Laterality Date   ABDOMINAL HYSTERECTOMY     AUGMENTATION MAMMAPLASTY Bilateral    breast lift   CARDIAC CATHETERIZATION  2002   normal, due to syncope and abnormal myowiew ([place])   COLONOSCOPY WITH  PROPOFOL  N/A 09/15/2017   Procedure: COLONOSCOPY WITH PROPOFOL ;  Surgeon: Selena Daily, MD;  Location: [facility];  Service: Gastroenterology;  Laterality: N/A;   IR IMAGING GUIDED PORT INSERTION  05/11/2024   MASTOPEXY  2008   OOPHORECTOMY     secondary to scar tissue,  [facility]   RIGHT OOPHORECTOMY     TONSILLECTOMY     TUBAL LIGATION      HEMATOLOGY/ONCOLOGY HISTORY:  Oncology History  Colon cancer metastasized to liver (HCC)  05/10/2024 Initial Diagnosis   Colon cancer metastasized to liver (HCC)   05/22/2024 -  Chemotherapy   Patient is on Treatment Plan : COLORECTAL FOLFOX + Bevacizumab  q14d       ALLERGIES:  is allergic to lisinopril .  MEDICATIONS:  Current Outpatient Medications  Medication Sig Dispense Refill   acetaminophen (TYLENOL) 325 MG tablet Take 650 mg by mouth every 6 (six) hours as needed.     amLODipine  (NORVASC ) 10 MG tablet Take 1 tablet (10 mg total) by mouth daily. 90 tablet 1   aspirin 81 MG tablet Take 81 mg by mouth daily.     cefpodoxime (VANTIN) 200 MG tablet Take 1 tablet (200 mg total) by mouth 2 (two) times daily. 14 tablet 0   cholecalciferol (VITAMIN D3) 25 MCG (1000 UNIT) tablet Take 1,000 Units by mouth daily.     dexamethasone  (DECADRON ) 4 MG tablet Take 2 tablets (8 mg total) by mouth daily. Start the day after chemotherapy for 2 days. Take with food. 30 tablet 1   diltiazem  (CARDIZEM ) 30  MG tablet Take 1 tablet (30 mg total) by mouth as needed (rapid heart rate). 90 tablet 2   furosemide  (LASIX ) 20 MG tablet Take 1 tablet (20 mg total) by mouth daily as needed. 90 tablet 1   lidocaine -prilocaine  (EMLA ) cream Apply to affected area once 30 g 3   metoprolol  succinate (TOPROL -XL) 100 MG 24 hr tablet Take 1 tablet (100 mg total) by mouth daily. 90 tablet 1   ondansetron  (ZOFRAN ) 8 MG tablet Take 1 tablet (8 mg total) by mouth every 8 (eight) hours as needed for nausea or vomiting. Start on the third day after chemotherapy. 30 tablet 1    oxyCODONE  (OXY IR/ROXICODONE ) 5 MG immediate release tablet Take 1 tablet (5 mg total) by mouth every 4 (four) hours as needed for severe pain (pain score 7-10). 120 tablet 0   pantoprazole  (PROTONIX ) 20 MG tablet Take 1 tablet (20 mg total) by mouth daily. 30 tablet 3   prochlorperazine  (COMPAZINE ) 10 MG tablet Take 1 tablet (10 mg total) by mouth every 6 (six) hours as needed for nausea or vomiting. 30 tablet 1   No current facility-administered medications for this visit.   Facility-Administered Medications Ordered in Other Visits  Medication Dose Route Frequency Provider Last Rate Last Admin   alteplase (CATHFLO ACTIVASE) injection 2 mg  2 mg CRRT Once Kelvin Burpee R, NP        VITAL SIGNS: There were no vitals taken for this visit. There were no vitals filed for this visit.  Estimated body mass index is 28.31 kg/m as calculated from the following:   Height as of 05/29/24: 5\' 3"  (1.6 m).   Weight as of 05/29/24: 159 lb 12.8 oz (72.5 kg).  LABS: CBC:    Component Value Date/Time   WBC 5.5 05/29/2024 0313   HGB 13.0 05/29/2024 0313   HGB 12.9 05/22/2024 0929   HCT 38.7 05/29/2024 0313   PLT 252 05/29/2024 0313   PLT 375 05/22/2024 0929   MCV 91.1 05/29/2024 0313   NEUTROABS 3.2 05/29/2024 0313   LYMPHSABS 1.4 05/29/2024 0313   MONOABS 0.4 05/29/2024 0313   EOSABS 0.5 05/29/2024 0313   BASOSABS 0.0 05/29/2024 0313   Comprehensive Metabolic Panel:    Component Value Date/Time   NA 131 (L) 05/29/2024 0313   K 3.8 05/29/2024 0313   CL 100 05/29/2024 0313   CO2 23 05/29/2024 0313   BUN 10 05/29/2024 0313   CREATININE 0.65 05/29/2024 0313   CREATININE 0.64 05/22/2024 0929   CREATININE 0.69 12/04/2011 1650   GLUCOSE 109 (H) 05/29/2024 0313   CALCIUM  10.0 05/29/2024 0313   AST 39 05/29/2024 0313   AST 40 05/22/2024 0929   ALT 30 05/29/2024 0313   ALT 26 05/22/2024 0929   ALKPHOS 98 05/29/2024 0313   BILITOT 1.4 (H) 05/29/2024 0313   BILITOT 0.8 05/22/2024 0929   PROT  7.2 05/29/2024 0313   ALBUMIN 3.6 05/29/2024 0313    RADIOGRAPHIC STUDIES: DG Chest 2 View Result Date: 05/29/2024 CLINICAL DATA:  Fever for 2 days, history of lung carcinoma EXAM: CHEST - 2 VIEW COMPARISON:  PET-CT from 05/16/2024 FINDINGS: Cardiac shadows within normal limits. Right chest wall port is noted. The lungs are clear bilaterally. No focal infiltrate or effusion is seen. No bony abnormality is noted. IMPRESSION: No acute abnormality noted. Electronically Signed   By: Violeta Grey M.D.   On: 05/29/2024 04:03   NM PET Image Initial (PI) Skull Base To Thigh Result Date: 05/16/2024 CLINICAL  DATA:  Initial treatment strategy for colon cancer. EXAM: NUCLEAR MEDICINE PET SKULL BASE TO THIGH TECHNIQUE: 8.4 mCi F-18 FDG was injected intravenously. Full-ring PET imaging was performed from the skull base to thigh after the radiotracer. CT data was obtained and used for attenuation correction and anatomic localization. Fasting blood glucose: 88 mg/dl COMPARISON:  Multiple exams, including MRI 05/04/2024 FINDINGS: Mediastinal blood pool activity: SUV max 2.5 Liver activity: SUV max NA NECK: No significant abnormal hypermetabolic activity in this region. Incidental CT findings: None. CHEST: 0.5 cm left upper lobe pulmonary nodule on image 38 series 6 has a maximum SUV of 1.6, is technically too small to characterize by PET-CT. This nodule is not readily appreciated on the prior exam from 01/04/2014. 4 by 3 mm right lower lobe nodule on image 58 series 6 is not appreciably metabolic but below sensitive PET-CT size thresholds. This nodule is stable from 01/04/2014 and hence considered benign. Incidental CT findings: Right Port-A-Cath tip: Cavoatrial junction. ABDOMEN/PELVIS: Two large hepatic metastatic lesions are present, this includes a centrally necrotic 8.7 cm mass centered in segment 7 (maximum SUV 16.7) and a 12.9 cm mass centered inferiorly in the right hepatic lobe with maximum SUV 12.9. Hypermetabolic  circumferential mass involving a 5 cm segment of the sigmoid colon has maximum SUV 26.2, compatible with primary malignancy. Adjacent hypermetabolic sigmoid lymph nodes near the mass are observed measuring up to 0.9 cm with maximum SUV in these lymph nodes of 5.3, compatible with local nodal involvement. Incidental CT findings: None. SKELETON: No significant abnormal hypermetabolic activity in this region. Incidental CT findings: None. IMPRESSION: 1. Hypermetabolic circumferential mass involving a 5 cm segment of the sigmoid colon is compatible with primary malignancy. Adjacent hypermetabolic sigmoid lymph nodes near the mass are compatible with local nodal involvement. 2. Two large hepatic metastatic lesions are present, this includes a centrally necrotic 8.7 cm mass centered in segment 7 and a 12.9 cm mass centered inferiorly in the right hepatic lobe. 3. 0.5 cm left upper lobe pulmonary nodule has a maximum SUV of 1.6, is technically too small to characterize by PET-CT, and was not readily appreciable on the 01/04/2014 CT chest. This nodule is too small for biopsy and remains indeterminate for malignancy. Surveillance CT imaging of the chest is recommended. Electronically Signed   By: Freida Jes M.D.   On: 05/16/2024 15:09   IR IMAGING GUIDED PORT INSERTION Result Date: 05/11/2024 INDICATION: 63 year old female referred for port catheter EXAM: IMAGE GUIDED PORT CATHETER MEDICATIONS: None ANESTHESIA/SEDATION: Moderate (conscious) sedation was employed during this procedure. A total of Versed  1.5 mg and Fentanyl  75 mcg was administered intravenously. Moderate Sedation Time: 20 minutes. The patient's level of consciousness and vital signs were monitored continuously by radiology nursing throughout the procedure under my direct supervision. FLUOROSCOPY TIME:  Reference air kerma: 1.7 mGy COMPLICATIONS: None PROCEDURE: Informed written consent was obtained from the patient after a discussion of the risks,  benefits, and alternatives to treatment. Questions regarding the procedure were encouraged and answered. The right neck and chest were prepped with chlorhexidine in a sterile fashion, and a sterile drape was applied covering the operative field. Maximum barrier sterile technique with sterile gowns and gloves were used for the procedure. A timeout was performed prior to the initiation of the procedure. Ultrasound survey was performed with images stored and sent to PACs. Right IJ vein documented to be patent. The right neck and chest was prepped with chlorhexidine, and draped in the usual sterile fashion using maximum  barrier technique (cap and mask, sterile gown, sterile gloves, large sterile sheet, hand hygiene and cutaneous antiseptic). Local anesthesia was attained by infiltration with 1% lidocaine  without epinephrine. Ultrasound demonstrated patency of the right internal jugular vein, and this was documented with an image. Under real-time ultrasound guidance, this vein was accessed with a 21 gauge micropuncture needle and image documentation was performed. A small dermatotomy was made at the access site with an 11 scalpel. A 0.018" wire was advanced into the SVC and used to estimate the length of the internal catheter. The access needle exchanged for a 33F micropuncture vascular sheath. The 0.018" wire was then removed and a 0.035" wire advanced into the IVC. An appropriate location for the subcutaneous reservoir was selected below the clavicle and an incision was made through the skin and underlying soft tissues. The subcutaneous tissues were then dissected using a combination of blunt and sharp surgical technique and a pocket was formed. A single lumen power injectable portacatheter was then tunneled through the subcutaneous tissues from the pocket to the dermatotomy and the port reservoir placed within the subcutaneous pocket. The venous access site was then serially dilated and a peel away vascular sheath  placed over the wire. The wire was removed and the port catheter advanced into position under fluoroscopic guidance. The catheter tip is positioned in the cavoatrial junction. This was documented with a spot image. The portacatheter was then tested and found to flush and aspirate well. The port was flushed with saline followed by 100 units/mL heparinized saline. The pocket was then closed in two layers using first subdermal inverted interrupted absorbable sutures followed by a running subcuticular suture. The epidermis was then sealed with Dermabond. The dermatotomy at the venous access site was also seal with Dermabond. Patient tolerated the procedure well and remained hemodynamically stable throughout. No complications encountered and no significant blood loss encountered IMPRESSION: Status post image guided right IJ port catheter placement Signed, Marciano Settles. Rexine Cater, RPVI [facility] Electronically Signed   By: Myrlene Asper D.O.   On: 05/11/2024 13:36   MR ABDOMEN W WO CONTRAST Result Date: 05/04/2024 CLINICAL DATA:  Liver masses EXAM: MRI ABDOMEN WITHOUT AND WITH CONTRAST TECHNIQUE: Multiplanar multisequence MR imaging of the abdomen was performed both before and after the administration of intravenous contrast. CONTRAST:  7mL GADAVIST  GADOBUTROL  1 MMOL/ML IV SOLN COMPARISON:  CT abdomen pelvis, 03/03/2024 FINDINGS: Lower chest: No acute abnormality. Hepatobiliary: Extremely bulky heterogeneously hypoenhancing masses occupying the majority of the right lobe of the liver, dominant mass measuring 14.9 x 12.9 x 9.1 cm (series 16, image 45, series 3, image 17). Occasional incidental, benign cysts scattered throughout the liver. No gallstones, gallbladder wall thickening, or biliary dilatation. Pancreas: Unremarkable. No pancreatic ductal dilatation or surrounding inflammatory changes. Spleen: Normal in size without significant abnormality.  Adrenals/Urinary Tract: Adrenal glands are unremarkable. Kidneys are normal, without renal calculi, solid lesion, or hydronephrosis. Stomach/Bowel: Stomach is within normal limits. No evidence of bowel wall thickening, distention, or inflammatory changes. Vascular/Lymphatic: No significant vascular findings are present. No enlarged abdominal lymph nodes. Other: No abdominal wall hernia or abnormality. No ascites. Musculoskeletal: No acute or significant osseous findings. IMPRESSION: 1. Extremely bulky heterogeneously hypoenhancing masses occupying the majority of the right lobe of the liver, dominant mass measuring 14.9 x 12.9 x 9.1 cm. Findings are consistent with hepatic metastatic disease. No metastases within the left lobe. In retrospective review of CT dated 05/03/2024, there is a rectosigmoid colon  mass with associated mesenteric lymphadenopathy, which is presumably the primary origin of these metastases. 2. No other evidence of lymphadenopathy or metastatic disease in the abdomen. Electronically Signed   By: Fredricka Jenny M.D.   On: 05/04/2024 21:25   US  ABDOMEN LIMITED RUQ (LIVER/GB) Result Date: 05/03/2024 CLINICAL DATA:  Right upper quadrant pain EXAM: ULTRASOUND ABDOMEN LIMITED RIGHT UPPER QUADRANT COMPARISON:  CT earlier today FINDINGS: Gallbladder: No gallstones or wall thickening visualized. No sonographic Murphy sign noted by sonographer. Common bile duct: Diameter: Normal caliber, 3 mm Liver: Multiple heterogeneous solid masses, the largest measurable mass in the right hepatic lobe measuring up to 10.4 cm. These do not have a sonographic appearance of hemangiomas. Few scattered cysts, the largest 3.5 cm in the inferior right lobe. Portal vein is patent on color Doppler imaging with normal direction of blood flow towards the liver. Other: None. IMPRESSION: Multiple heterogeneous solid masses in the right hepatic lobe as seen on CT concerning for metastases or primary hepatocellular carcinoma.  Recommend further evaluation with MRI. Electronically Signed   By: Janeece Mechanic M.D.   On: 05/03/2024 13:44   CT ABDOMEN PELVIS W CONTRAST Result Date: 05/03/2024 CLINICAL DATA:  Right abdominal pain EXAM: CT ABDOMEN AND PELVIS WITH CONTRAST TECHNIQUE: Multidetector CT imaging of the abdomen and pelvis was performed using the standard protocol following bolus administration of intravenous contrast. RADIATION DOSE REDUCTION: This exam was performed according to the departmental dose-optimization program which includes automated exposure control, adjustment of the mA and/or kV according to patient size and/or use of iterative reconstruction technique. CONTRAST:  OMNIPAQUE  IOHEXOL  300 MG/ML  SOLN COMPARISON:  None Available. FINDINGS: Lower chest: No acute abnormality Hepatobiliary: Large enhancing masses within the liver measuring up to 12.5 cm in the right hepatic lobe on image 24 and 8.5 cm in the posterior right hepatic dome on image 16. Scattered water  density small cysts throughout the liver. Gallbladder unremarkable. No biliary ductal dilatation. Pancreas: No focal abnormality or ductal dilatation. Spleen: No focal abnormality.  Normal size. Adrenals/Urinary Tract: No adrenal abnormality. No focal renal abnormality. No stones or hydronephrosis. Urinary bladder is unremarkable. Stomach/Bowel: Normal appendix. Stomach, large and small bowel grossly unremarkable. Vascular/Lymphatic: No evidence of aneurysm or adenopathy. Reproductive: Prior hysterectomy.  No adnexal masses. Other: No free fluid or free air. Musculoskeletal: No acute bony abnormality or focal bone lesion. IMPRESSION: Large enhancing masses within the liver. Differential considerations would include metastases, primary hepatic malignancy or giant hemangiomas. Recommend further evaluation with MRI. Electronically Signed   By: Janeece Mechanic M.D.   On: 05/03/2024 13:09    PERFORMANCE STATUS (ECOG) : 1 - Symptomatic but completely  ambulatory  Review of Systems Unless otherwise noted, a complete review of systems is negative.  Physical Exam General: NAD HEENT: Ears clear, OP without exudate, no lymphadenopathy, no mucositis or thrush Cardiovascular: regular rate and rhythm Pulmonary: clear anterior/posterior fields Abdomen: soft, nontender, + bowel sounds GU: no suprapubic tenderness Extremities: no edema, no joint deformities Skin: no rashes Neurological: nonfocal  IMPRESSION/PLAN: Stage IV colon cancer -status post cycle 1 FOLFOX plus Bev on 05/22/2024  Fever -vitals otherwise stable.  Patient is nontoxic-appearing with benign exam.  Workup in ER reassuring.  Blood cultures showed no growth at day 1.  Chest x-ray and respiratory panel were both negative.  UA was weakly positive in the light of dysuria, patient was started on antibiotics.  Discussed with Dr. Randy Buttery and will broaden antibiotic coverage with rotation to Levaquin  x 7 days.  ED triggers reviewed in detail.   Constipation -will start on as needed lactulose as patient has not had improvement with OTC laxatives.  Hyponatremia -proceed with gentle fluids today.  Discussed with Dr. Randy Buttery and will order hyponatremia workup.  RTC later this week for labs/SMC/possible fluids  Case and plan discussed with Dr. Randy Buttery  Patient expressed understanding and was in agreement with this plan. She also understands that She can call clinic at any time with any questions, concerns, or complaints.   Thank you for allowing me to participate in the care of this very pleasant patient.   Time Total: 25 minutes  Visit consisted of counseling and education dealing with the complex and emotionally intense issues of symptom management in the setting of serious illness.Greater than 50%  of this time was spent counseling and coordinating care related to the above assessment and plan.  Signed by: Gerilyn Kobus, PhD, NP-C

## 2024-06-02 ENCOUNTER — Inpatient Hospital Stay (HOSPITAL_BASED_OUTPATIENT_CLINIC_OR_DEPARTMENT_OTHER): Admitting: Hospice and Palliative Medicine

## 2024-06-02 ENCOUNTER — Inpatient Hospital Stay

## 2024-06-02 ENCOUNTER — Other Ambulatory Visit: Payer: Self-pay

## 2024-06-02 ENCOUNTER — Encounter: Payer: Self-pay | Admitting: Hospice and Palliative Medicine

## 2024-06-02 VITALS — BP 141/80 | HR 80 | Temp 98.1°F | Resp 19 | Wt 158.3 lb

## 2024-06-02 DIAGNOSIS — C189 Malignant neoplasm of colon, unspecified: Secondary | ICD-10-CM

## 2024-06-02 DIAGNOSIS — Z5112 Encounter for antineoplastic immunotherapy: Secondary | ICD-10-CM | POA: Diagnosis not present

## 2024-06-02 DIAGNOSIS — C787 Secondary malignant neoplasm of liver and intrahepatic bile duct: Secondary | ICD-10-CM

## 2024-06-02 DIAGNOSIS — R509 Fever, unspecified: Secondary | ICD-10-CM

## 2024-06-02 DIAGNOSIS — F32A Depression, unspecified: Secondary | ICD-10-CM

## 2024-06-02 DIAGNOSIS — Z95828 Presence of other vascular implants and grafts: Secondary | ICD-10-CM

## 2024-06-02 DIAGNOSIS — E860 Dehydration: Secondary | ICD-10-CM

## 2024-06-02 LAB — CBC WITH DIFFERENTIAL (CANCER CENTER ONLY)
Abs Immature Granulocytes: 0.01 10*3/uL (ref 0.00–0.07)
Basophils Absolute: 0.1 10*3/uL (ref 0.0–0.1)
Basophils Relative: 1 %
Eosinophils Absolute: 0.3 10*3/uL (ref 0.0–0.5)
Eosinophils Relative: 7 %
HCT: 30.7 % — ABNORMAL LOW (ref 36.0–46.0)
Hemoglobin: 10.2 g/dL — ABNORMAL LOW (ref 12.0–15.0)
Immature Granulocytes: 0 %
Lymphocytes Relative: 25 %
Lymphs Abs: 1.1 10*3/uL (ref 0.7–4.0)
MCH: 30.3 pg (ref 26.0–34.0)
MCHC: 33.2 g/dL (ref 30.0–36.0)
MCV: 91.1 fL (ref 80.0–100.0)
Monocytes Absolute: 0.6 10*3/uL (ref 0.1–1.0)
Monocytes Relative: 15 %
Neutro Abs: 2.3 10*3/uL (ref 1.7–7.7)
Neutrophils Relative %: 52 %
Platelet Count: 283 10*3/uL (ref 150–400)
RBC: 3.37 MIL/uL — ABNORMAL LOW (ref 3.87–5.11)
RDW: 12.4 % (ref 11.5–15.5)
WBC Count: 4.4 10*3/uL (ref 4.0–10.5)
nRBC: 0 % (ref 0.0–0.2)

## 2024-06-02 LAB — CMP (CANCER CENTER ONLY)
ALT: 44 U/L (ref 0–44)
AST: 46 U/L — ABNORMAL HIGH (ref 15–41)
Albumin: 3.2 g/dL — ABNORMAL LOW (ref 3.5–5.0)
Alkaline Phosphatase: 105 U/L (ref 38–126)
Anion gap: 10 (ref 5–15)
BUN: 11 mg/dL (ref 6–20)
CO2: 21 mmol/L — ABNORMAL LOW (ref 22–32)
Calcium: 9.3 mg/dL (ref 8.9–10.3)
Chloride: 100 mmol/L (ref 98–111)
Creatinine: 0.54 mg/dL (ref 0.44–1.00)
GFR, Estimated: >60 mL/min (ref >60)
Glucose, Bld: 103 mg/dL — ABNORMAL HIGH (ref 70–99)
Potassium: 3.7 mmol/L (ref 3.5–5.1)
Sodium: 131 mmol/L — ABNORMAL LOW (ref 135–145)
Total Bilirubin: 0.9 mg/dL (ref 0.0–1.2)
Total Protein: 6.7 g/dL (ref 6.5–8.1)

## 2024-06-02 MED ORDER — HEPARIN SOD (PORK) LOCK FLUSH 100 UNIT/ML IV SOLN
500.0000 [IU] | Freq: Once | INTRAVENOUS | Status: AC
  Administered 2024-06-02: 500 [IU]
  Filled 2024-06-02: qty 5

## 2024-06-02 MED ORDER — ESCITALOPRAM OXALATE 10 MG PO TABS
10.0000 mg | ORAL_TABLET | Freq: Every day | ORAL | 0 refills | Status: AC
  Filled 2024-06-02: qty 30, 30d supply, fill #0

## 2024-06-02 MED ORDER — SODIUM CHLORIDE 0.9 % IV SOLN
INTRAVENOUS | Status: DC
  Filled 2024-06-02 (×2): qty 250

## 2024-06-02 MED ORDER — LEVOFLOXACIN 750 MG PO TABS
750.0000 mg | ORAL_TABLET | Freq: Every day | ORAL | 0 refills | Status: DC
  Filled 2024-06-02 – 2024-06-05 (×3): qty 7, 7d supply, fill #0

## 2024-06-02 MED ORDER — SODIUM CHLORIDE 0.9% FLUSH
10.0000 mL | Freq: Once | INTRAVENOUS | Status: AC
  Administered 2024-06-02: 10 mL via INTRAVENOUS
  Filled 2024-06-02: qty 10

## 2024-06-02 NOTE — Progress Notes (Signed)
 Symptom Management Clinic [facility] at [facility] [phone] [phone]  Patient Care Team: Thersia Flax, MD as PCP - General (Internal Medicine) Rochell Chroman, RN as Oncology Nurse Navigator Avonne Boettcher, MD as Consulting Physician (Oncology)   NAME OF PATIENT: Carla Mckinney  [id number]  1961-08-15   DATE OF VISIT: 06/02/24  REASON FOR CONSULT: TEPHANIE ESCORCIA is a 63 y.o. female with multiple medical problems including stage IV adenocarcinoma of the colon with liver metastasis.   INTERVAL HISTORY: Patient received cycle 1 FOLFOX plus Bev on 05/22/2024  Patient presented emergency department 05/29/2024 with fever with dysuria.  No evidence of sepsis.  Workup was suggestive of UTI and patient discharged home on cefpodoxime .  Patient was seen in [facility] on 05/30/2024 with ongoing fevers.  Workup was unremarkable.  Antibiotics were broadened to p.o. Levaquin .  Patient returns today for follow-up labs and supportive care.  Patient states that overall she is feeling improved.  However, she still has intermittent fevers.  Tmax of 100.0 this morning.  Patient denies chills.  States that she is having occasional night sweats.  She is taking intermittent acetaminophen.  Denies rhinorrhea, sore throat, cough or congestion.  No chest pain or shortness of breath.  No GI symptoms or abdominal pain.  Denies dysuria, urinary frequency or urgency.  Patient states that she has had resolution of her constipation.  Patient does endorse depressive symptoms and says that she is frequently crying.  Reports reduced oral intake.  Patient offers no further specific complaints today.   PAST MEDICAL HISTORY: Past Medical History:  Diagnosis Date   Hypertension    Rheumatic fever 1986   has had echo since then. no cardiac issues.    PAST SURGICAL HISTORY:  Past Surgical History:  Procedure Laterality Date   ABDOMINAL HYSTERECTOMY     AUGMENTATION  MAMMAPLASTY Bilateral    breast lift   CARDIAC CATHETERIZATION  2002   normal, due to syncope and abnormal myowiew ([place])   COLONOSCOPY WITH PROPOFOL  N/A 09/15/2017   Procedure: COLONOSCOPY WITH PROPOFOL ;  Surgeon: Selena Daily, MD;  Location: [facility];  Service: Gastroenterology;  Laterality: N/A;   IR IMAGING GUIDED PORT INSERTION  05/11/2024   MASTOPEXY  2008   OOPHORECTOMY     secondary to scar tissue,  [facility]   RIGHT OOPHORECTOMY     TONSILLECTOMY     TUBAL LIGATION      HEMATOLOGY/ONCOLOGY HISTORY:  Oncology History  Colon cancer metastasized to liver (HCC)  05/10/2024 Initial Diagnosis   Colon cancer metastasized to liver (HCC)   05/22/2024 -  Chemotherapy   Patient is on Treatment Plan : COLORECTAL FOLFOX + Bevacizumab  q14d       ALLERGIES:  is allergic to lisinopril .  MEDICATIONS:  Current Outpatient Medications  Medication Sig Dispense Refill   acetaminophen (TYLENOL) 325 MG tablet Take 650 mg by mouth every 6 (six) hours as needed.     amLODipine  (NORVASC ) 10 MG tablet Take 1 tablet (10 mg total) by mouth daily. 90 tablet 1   aspirin 81 MG tablet Take 81 mg by mouth daily.     cholecalciferol (VITAMIN D3) 25 MCG (1000 UNIT) tablet Take 1,000 Units by mouth daily.     dexamethasone  (DECADRON ) 4 MG tablet Take 2 tablets (8 mg total) by mouth daily. Start the day after chemotherapy for 2 days. Take with food. 30 tablet 1   diltiazem  (CARDIZEM ) 30 MG tablet Take 1 tablet (30  mg total) by mouth as needed (rapid heart rate). 90 tablet 2   furosemide  (LASIX ) 20 MG tablet Take 1 tablet (20 mg total) by mouth daily as needed. 90 tablet 1   lactulose  (CHRONULAC ) 10 GM/15ML solution Take 15-30 mLs (10-20 g total) by mouth 2 (two) times daily as needed for mild constipation. 236 mL 0   levofloxacin  (LEVAQUIN ) 750 MG tablet Take 1 tablet (750 mg total) by mouth daily. 7 tablet 0   lidocaine -prilocaine  (EMLA ) cream Apply to affected area once 30 g 3   metoprolol   succinate (TOPROL -XL) 100 MG 24 hr tablet Take 1 tablet (100 mg total) by mouth daily. 90 tablet 1   ondansetron  (ZOFRAN ) 8 MG tablet Take 1 tablet (8 mg total) by mouth every 8 (eight) hours as needed for nausea or vomiting. Start on the third day after chemotherapy. 30 tablet 1   oxyCODONE  (OXY IR/ROXICODONE ) 5 MG immediate release tablet Take 1 tablet (5 mg total) by mouth every 4 (four) hours as needed for severe pain (pain score 7-10). 120 tablet 0   pantoprazole  (PROTONIX ) 20 MG tablet Take 1 tablet (20 mg total) by mouth daily. 30 tablet 3   prochlorperazine  (COMPAZINE ) 10 MG tablet Take 1 tablet (10 mg total) by mouth every 6 (six) hours as needed for nausea or vomiting. 30 tablet 1   No current facility-administered medications for this visit.    VITAL SIGNS: There were no vitals taken for this visit. There were no vitals filed for this visit.  Estimated body mass index is 28.24 kg/m as calculated from the following:   Height as of 05/29/24: 5' 3 (1.6 m).   Weight as of 05/30/24: 159 lb 6.4 oz (72.3 kg).  LABS: CBC:    Component Value Date/Time   WBC 7.6 05/30/2024 1315   WBC 5.5 05/29/2024 0313   HGB 11.6 (L) 05/30/2024 1315   HCT 34.4 (L) 05/30/2024 1315   PLT 252 05/30/2024 1315   MCV 90.8 05/30/2024 1315   NEUTROABS 4.8 05/30/2024 1315   LYMPHSABS 1.5 05/30/2024 1315   MONOABS 0.6 05/30/2024 1315   EOSABS 0.6 (H) 05/30/2024 1315   BASOSABS 0.0 05/30/2024 1315   Comprehensive Metabolic Panel:    Component Value Date/Time   NA 129 (L) 05/30/2024 1315   K 3.6 05/30/2024 1315   CL 99 05/30/2024 1315   CO2 20 (L) 05/30/2024 1315   BUN 12 05/30/2024 1315   CREATININE 0.62 05/30/2024 1315   CREATININE 0.69 12/04/2011 1650   GLUCOSE 101 (H) 05/30/2024 1315   CALCIUM  9.4 05/30/2024 1315   AST 34 05/30/2024 1315   ALT 28 05/30/2024 1315   ALKPHOS 101 05/30/2024 1315   BILITOT 1.0 05/30/2024 1315   PROT 7.2 05/30/2024 1315   ALBUMIN 3.7 05/30/2024 1315     RADIOGRAPHIC STUDIES: DG Chest 2 View Result Date: 05/29/2024 CLINICAL DATA:  Fever for 2 days, history of lung carcinoma EXAM: CHEST - 2 VIEW COMPARISON:  PET-CT from 05/16/2024 FINDINGS: Cardiac shadows within normal limits. Right chest wall port is noted. The lungs are clear bilaterally. No focal infiltrate or effusion is seen. No bony abnormality is noted. IMPRESSION: No acute abnormality noted. Electronically Signed   By: Violeta Grey M.D.   On: 05/29/2024 04:03   NM PET Image Initial (PI) Skull Base To Thigh Result Date: 05/16/2024 CLINICAL DATA:  Initial treatment strategy for colon cancer. EXAM: NUCLEAR MEDICINE PET SKULL BASE TO THIGH TECHNIQUE: 8.4 mCi F-18 FDG was injected intravenously. Full-ring PET  imaging was performed from the skull base to thigh after the radiotracer. CT data was obtained and used for attenuation correction and anatomic localization. Fasting blood glucose: 88 mg/dl COMPARISON:  Multiple exams, including MRI 05/04/2024 FINDINGS: Mediastinal blood pool activity: SUV max 2.5 Liver activity: SUV max NA NECK: No significant abnormal hypermetabolic activity in this region. Incidental CT findings: None. CHEST: 0.5 cm left upper lobe pulmonary nodule on image 38 series 6 has a maximum SUV of 1.6, is technically too small to characterize by PET-CT. This nodule is not readily appreciated on the prior exam from 01/04/2014. 4 by 3 mm right lower lobe nodule on image 58 series 6 is not appreciably metabolic but below sensitive PET-CT size thresholds. This nodule is stable from 01/04/2014 and hence considered benign. Incidental CT findings: Right Port-A-Cath tip: Cavoatrial junction. ABDOMEN/PELVIS: Two large hepatic metastatic lesions are present, this includes a centrally necrotic 8.7 cm mass centered in segment 7 (maximum SUV 16.7) and a 12.9 cm mass centered inferiorly in the right hepatic lobe with maximum SUV 12.9. Hypermetabolic circumferential mass involving a 5 cm segment of  the sigmoid colon has maximum SUV 26.2, compatible with primary malignancy. Adjacent hypermetabolic sigmoid lymph nodes near the mass are observed measuring up to 0.9 cm with maximum SUV in these lymph nodes of 5.3, compatible with local nodal involvement. Incidental CT findings: None. SKELETON: No significant abnormal hypermetabolic activity in this region. Incidental CT findings: None. IMPRESSION: 1. Hypermetabolic circumferential mass involving a 5 cm segment of the sigmoid colon is compatible with primary malignancy. Adjacent hypermetabolic sigmoid lymph nodes near the mass are compatible with local nodal involvement. 2. Two large hepatic metastatic lesions are present, this includes a centrally necrotic 8.7 cm mass centered in segment 7 and a 12.9 cm mass centered inferiorly in the right hepatic lobe. 3. 0.5 cm left upper lobe pulmonary nodule has a maximum SUV of 1.6, is technically too small to characterize by PET-CT, and was not readily appreciable on the 01/04/2014 CT chest. This nodule is too small for biopsy and remains indeterminate for malignancy. Surveillance CT imaging of the chest is recommended. Electronically Signed   By: Freida Jes M.D.   On: 05/16/2024 15:09   IR IMAGING GUIDED PORT INSERTION Result Date: 05/11/2024 INDICATION: 63 year old female referred for port catheter EXAM: IMAGE GUIDED PORT CATHETER MEDICATIONS: None ANESTHESIA/SEDATION: Moderate (conscious) sedation was employed during this procedure. A total of Versed  1.5 mg and Fentanyl  75 mcg was administered intravenously. Moderate Sedation Time: 20 minutes. The patient's level of consciousness and vital signs were monitored continuously by radiology nursing throughout the procedure under my direct supervision. FLUOROSCOPY TIME:  Reference air kerma: 1.7 mGy COMPLICATIONS: None PROCEDURE: Informed written consent was obtained from the patient after a discussion of the risks, benefits, and alternatives to treatment. Questions  regarding the procedure were encouraged and answered. The right neck and chest were prepped with chlorhexidine in a sterile fashion, and a sterile drape was applied covering the operative field. Maximum barrier sterile technique with sterile gowns and gloves were used for the procedure. A timeout was performed prior to the initiation of the procedure. Ultrasound survey was performed with images stored and sent to PACs. Right IJ vein documented to be patent. The right neck and chest was prepped with chlorhexidine, and draped in the usual sterile fashion using maximum barrier technique (cap and mask, sterile gown, sterile gloves, large sterile sheet, hand hygiene and cutaneous antiseptic). Local anesthesia was attained by infiltration with 1% lidocaine   without epinephrine. Ultrasound demonstrated patency of the right internal jugular vein, and this was documented with an image. Under real-time ultrasound guidance, this vein was accessed with a 21 gauge micropuncture needle and image documentation was performed. A small dermatotomy was made at the access site with an 11 scalpel. A 0.018 wire was advanced into the SVC and used to estimate the length of the internal catheter. The access needle exchanged for a 3F micropuncture vascular sheath. The 0.018 wire was then removed and a 0.035 wire advanced into the IVC. An appropriate location for the subcutaneous reservoir was selected below the clavicle and an incision was made through the skin and underlying soft tissues. The subcutaneous tissues were then dissected using a combination of blunt and sharp surgical technique and a pocket was formed. A single lumen power injectable portacatheter was then tunneled through the subcutaneous tissues from the pocket to the dermatotomy and the port reservoir placed within the subcutaneous pocket. The venous access site was then serially dilated and a peel away vascular sheath placed over the wire. The wire was removed and the port  catheter advanced into position under fluoroscopic guidance. The catheter tip is positioned in the cavoatrial junction. This was documented with a spot image. The portacatheter was then tested and found to flush and aspirate well. The port was flushed with saline followed by 100 units/mL heparinized saline. The pocket was then closed in two layers using first subdermal inverted interrupted absorbable sutures followed by a running subcuticular suture. The epidermis was then sealed with Dermabond. The dermatotomy at the venous access site was also seal with Dermabond. Patient tolerated the procedure well and remained hemodynamically stable throughout. No complications encountered and no significant blood loss encountered IMPRESSION: Status post image guided right IJ port catheter placement Signed, Marciano Settles. Rexine Cater, RPVI [facility] Electronically Signed   By: Myrlene Asper D.O.   On: 05/11/2024 13:36   MR ABDOMEN W WO CONTRAST Result Date: 05/04/2024 CLINICAL DATA:  Liver masses EXAM: MRI ABDOMEN WITHOUT AND WITH CONTRAST TECHNIQUE: Multiplanar multisequence MR imaging of the abdomen was performed both before and after the administration of intravenous contrast. CONTRAST:  7mL GADAVIST  GADOBUTROL  1 MMOL/ML IV SOLN COMPARISON:  CT abdomen pelvis, 03/03/2024 FINDINGS: Lower chest: No acute abnormality. Hepatobiliary: Extremely bulky heterogeneously hypoenhancing masses occupying the majority of the right lobe of the liver, dominant mass measuring 14.9 x 12.9 x 9.1 cm (series 16, image 45, series 3, image 17). Occasional incidental, benign cysts scattered throughout the liver. No gallstones, gallbladder wall thickening, or biliary dilatation. Pancreas: Unremarkable. No pancreatic ductal dilatation or surrounding inflammatory changes. Spleen: Normal in size without significant abnormality. Adrenals/Urinary Tract: Adrenal glands are unremarkable.  Kidneys are normal, without renal calculi, solid lesion, or hydronephrosis. Stomach/Bowel: Stomach is within normal limits. No evidence of bowel wall thickening, distention, or inflammatory changes. Vascular/Lymphatic: No significant vascular findings are present. No enlarged abdominal lymph nodes. Other: No abdominal wall hernia or abnormality. No ascites. Musculoskeletal: No acute or significant osseous findings. IMPRESSION: 1. Extremely bulky heterogeneously hypoenhancing masses occupying the majority of the right lobe of the liver, dominant mass measuring 14.9 x 12.9 x 9.1 cm. Findings are consistent with hepatic metastatic disease. No metastases within the left lobe. In retrospective review of CT dated 05/03/2024, there is a rectosigmoid colon mass with associated mesenteric lymphadenopathy, which is presumably the primary origin of these metastases. 2. No other evidence of lymphadenopathy or metastatic disease in the abdomen.  Electronically Signed   By: Fredricka Jenny M.D.   On: 05/04/2024 21:25   US  ABDOMEN LIMITED RUQ (LIVER/GB) Result Date: 05/03/2024 CLINICAL DATA:  Right upper quadrant pain EXAM: ULTRASOUND ABDOMEN LIMITED RIGHT UPPER QUADRANT COMPARISON:  CT earlier today FINDINGS: Gallbladder: No gallstones or wall thickening visualized. No sonographic Murphy sign noted by sonographer. Common bile duct: Diameter: Normal caliber, 3 mm Liver: Multiple heterogeneous solid masses, the largest measurable mass in the right hepatic lobe measuring up to 10.4 cm. These do not have a sonographic appearance of hemangiomas. Few scattered cysts, the largest 3.5 cm in the inferior right lobe. Portal vein is patent on color Doppler imaging with normal direction of blood flow towards the liver. Other: None. IMPRESSION: Multiple heterogeneous solid masses in the right hepatic lobe as seen on CT concerning for metastases or primary hepatocellular carcinoma. Recommend further evaluation with MRI. Electronically Signed    By: Janeece Mechanic M.D.   On: 05/03/2024 13:44    PERFORMANCE STATUS (ECOG) : 1 - Symptomatic but completely ambulatory  Review of Systems Unless otherwise noted, a complete review of systems is negative.  Physical Exam General: NAD HEENT: Ears clear, OP without exudate, no lymphadenopathy, no mucositis or thrush Cardiovascular: regular rate and rhythm Pulmonary: clear anterior/posterior fields Abdomen: soft, nontender, + bowel sounds GU: no suprapubic tenderness Extremities: no edema, no joint deformities Skin: no rashes Neurological: nonfocal  IMPRESSION/PLAN: Stage IV colon cancer -status post cycle 1 FOLFOX plus Bev on 05/22/2024  Fever -afebrile in clinic, but patient reports ongoing fevers at home.  Patient remains on p.o. Levaquin .  Discussed with Dr. Randy Buttery and will continue p.o. Levaquin  and refer to ID.  Constipation -resolved on as needed lactulose   Hyponatremia -workup suggestive of hypovolemic hyponatremia. Na slightly improved today after IVFs. Will proceed again with gentle IV rehydration.   Depression/anxiety - Referrals to [facility] and SW. start escitalopram 10 mg daily  RTC later this week for labs/SMC/possible fluids  Case and plan discussed with Dr. Randy Buttery  Patient expressed understanding and was in agreement with this plan. She also understands that She can call clinic at any time with any questions, concerns, or complaints.   Thank you for allowing me to participate in the care of this very pleasant patient.   Time Total: 25 minutes  Visit consisted of counseling and education dealing with the complex and emotionally intense issues of symptom management in the setting of serious illness.Greater than 50%  of this time was spent counseling and coordinating care related to the above assessment and plan.  Signed by: Gerilyn Kobus, PhD, NP-C

## 2024-06-03 LAB — CULTURE, BLOOD (ROUTINE X 2)
Culture: NO GROWTH
Culture: NO GROWTH
Special Requests: ADEQUATE

## 2024-06-05 ENCOUNTER — Other Ambulatory Visit: Payer: Self-pay

## 2024-06-05 ENCOUNTER — Inpatient Hospital Stay

## 2024-06-05 ENCOUNTER — Inpatient Hospital Stay (HOSPITAL_BASED_OUTPATIENT_CLINIC_OR_DEPARTMENT_OTHER): Admitting: Oncology

## 2024-06-05 ENCOUNTER — Telehealth: Payer: Self-pay

## 2024-06-05 ENCOUNTER — Encounter: Payer: Self-pay | Admitting: Oncology

## 2024-06-05 VITALS — BP 118/69 | HR 73 | Temp 98.5°F | Resp 19 | Ht 63.0 in | Wt 157.2 lb

## 2024-06-05 DIAGNOSIS — Z5112 Encounter for antineoplastic immunotherapy: Secondary | ICD-10-CM | POA: Diagnosis not present

## 2024-06-05 DIAGNOSIS — T451X5A Adverse effect of antineoplastic and immunosuppressive drugs, initial encounter: Secondary | ICD-10-CM | POA: Diagnosis not present

## 2024-06-05 DIAGNOSIS — E871 Hypo-osmolality and hyponatremia: Secondary | ICD-10-CM | POA: Diagnosis not present

## 2024-06-05 DIAGNOSIS — D701 Agranulocytosis secondary to cancer chemotherapy: Secondary | ICD-10-CM

## 2024-06-05 DIAGNOSIS — C189 Malignant neoplasm of colon, unspecified: Secondary | ICD-10-CM

## 2024-06-05 DIAGNOSIS — C787 Secondary malignant neoplasm of liver and intrahepatic bile duct: Secondary | ICD-10-CM

## 2024-06-05 DIAGNOSIS — G893 Neoplasm related pain (acute) (chronic): Secondary | ICD-10-CM

## 2024-06-05 LAB — IRON AND TIBC
Iron: 81 ug/dL (ref 28–170)
Saturation Ratios: 28 % (ref 10.4–31.8)
TIBC: 286 ug/dL (ref 250–450)
UIBC: 205 ug/dL

## 2024-06-05 LAB — CBC WITH DIFFERENTIAL (CANCER CENTER ONLY)
Abs Immature Granulocytes: 0.01 10*3/uL (ref 0.00–0.07)
Basophils Absolute: 0.1 10*3/uL (ref 0.0–0.1)
Basophils Relative: 2 %
Eosinophils Absolute: 0.2 10*3/uL (ref 0.0–0.5)
Eosinophils Relative: 6 %
HCT: 32.4 % — ABNORMAL LOW (ref 36.0–46.0)
Hemoglobin: 10.8 g/dL — ABNORMAL LOW (ref 12.0–15.0)
Immature Granulocytes: 0 %
Lymphocytes Relative: 30 %
Lymphs Abs: 0.8 10*3/uL (ref 0.7–4.0)
MCH: 30.6 pg (ref 26.0–34.0)
MCHC: 33.3 g/dL (ref 30.0–36.0)
MCV: 91.8 fL (ref 80.0–100.0)
Monocytes Absolute: 0.5 10*3/uL (ref 0.1–1.0)
Monocytes Relative: 20 %
Neutro Abs: 1.1 10*3/uL — ABNORMAL LOW (ref 1.7–7.7)
Neutrophils Relative %: 42 %
Platelet Count: 370 10*3/uL (ref 150–400)
RBC: 3.53 MIL/uL — ABNORMAL LOW (ref 3.87–5.11)
RDW: 12.6 % (ref 11.5–15.5)
WBC Count: 2.7 10*3/uL — ABNORMAL LOW (ref 4.0–10.5)
nRBC: 0 % (ref 0.0–0.2)

## 2024-06-05 LAB — CMP (CANCER CENTER ONLY)
ALT: 33 U/L (ref 0–44)
AST: 33 U/L (ref 15–41)
Albumin: 3.2 g/dL — ABNORMAL LOW (ref 3.5–5.0)
Alkaline Phosphatase: 101 U/L (ref 38–126)
Anion gap: 7 (ref 5–15)
BUN: 10 mg/dL (ref 6–20)
CO2: 23 mmol/L (ref 22–32)
Calcium: 9.8 mg/dL (ref 8.9–10.3)
Chloride: 103 mmol/L (ref 98–111)
Creatinine: 0.63 mg/dL (ref 0.44–1.00)
GFR, Estimated: >60 mL/min (ref >60)
Glucose, Bld: 101 mg/dL — ABNORMAL HIGH (ref 70–99)
Potassium: 3.7 mmol/L (ref 3.5–5.1)
Sodium: 133 mmol/L — ABNORMAL LOW (ref 135–145)
Total Bilirubin: 0.6 mg/dL (ref 0.0–1.2)
Total Protein: 6.5 g/dL (ref 6.5–8.1)

## 2024-06-05 LAB — FERRITIN: Ferritin: 205 ng/mL (ref 11–307)

## 2024-06-05 LAB — FOLATE: Folate: 22 ng/mL (ref >5.9)

## 2024-06-05 LAB — VITAMIN B12: Vitamin B-12: 601 pg/mL (ref 180–914)

## 2024-06-05 MED ORDER — SODIUM CHLORIDE 0.9% FLUSH
10.0000 mL | Freq: Once | INTRAVENOUS | Status: AC
  Administered 2024-06-05: 10 mL via INTRAVENOUS
  Filled 2024-06-05: qty 10

## 2024-06-05 MED ORDER — HEPARIN SOD (PORK) LOCK FLUSH 100 UNIT/ML IV SOLN
500.0000 [IU] | Freq: Once | INTRAVENOUS | Status: AC
  Administered 2024-06-05: 500 [IU] via INTRAVENOUS
  Filled 2024-06-05: qty 5

## 2024-06-05 NOTE — Progress Notes (Signed)
 Hematology/Oncology Consult note [facility]  Telephone:([phone] [phone]  Patient Care Team: Thersia Flax, MD as PCP - General (Internal Medicine) Rochell Chroman, RN as Oncology Nurse Navigator Avonne Boettcher, MD as Consulting Physician (Oncology)   Name of the patient: Carla Mckinney  [id number]  [birth year]   Date of visit: 06/05/24  Diagnosis- stage IV adenocarcinoma of the colon with liver metastases   Chief complaint/ Reason for visit-on treatment assessment prior to cycle 2 of palliative FOLFOX Avastin  chemotherapy  Heme/Onc history: patient is a 63 year old female with a past medical history significant for hypercalcemia due to hyperparathyroidism, hyperlipidemia among other medical problems. She was noted to have bright red blood in her stools along with symptoms of unintentional weight loss which has been going on for the last 6 weeks.  She has lost about 20 pounds of weight.  Her last colonoscopy in 2018 was unremarkable.  Patient was seen by [facility] and colonoscopy is planned a month from now.  Patient underwent CT abdomen and pelvis with contrast on 05/03/2024 for symptoms of right upper quadrant abdominal pain which showed large enhancing masses in the liver measuring up to 12.5 cm in the right hepatic lobe and 8.5 cm in the posterior right hepatic dome.  No evidence of intra-abdominal adenopathy.  This was followed by MRI abdomen with and without contrast which was ordered by me on 05/04/2024 which showed extremely bulky heterogeneously hypoenhancing masses occupying the majority of the right lobe of the liver measuring 14.9 x 12.9 x 9.1 cm.  Occasional incidental benign cyst throughout the liver.  Pancreas, spleen and adrenal glands and kidney appeared normal.  Stomach and intra-abdominal lymph nodes appeared normal.  Rectosigmoid mass was noted along with mesenteric adenopathy concerning for primary origin of these metastases.  aFP  was normal.  CEA was elevated at 305.  CA 19-9 elevated at 176.MMR stable.  Plan is to proceed with FOLFOX Avastin  chemotherapy for first-line   NGS testing showed T p53, APC, BRAF, APC, PHLPP 1, PPP2R2A SMAD4 mutations.  K-ras and NRAS negative.  MSI stable.  MMR normal.  Tumor mutational burden low.  Interval history-patient has been having on and off low-grade fevers since chemotherapy.  She had a couple of episodes of drenching night sweats  as well.  With regards to her fever she has had a complete workup including urinalysis chest x-ray and blood cultures which have been negative.  She has completed her Levaquin  dose today.  She has not had any significant abdominal pain.  Also denies any significant nausea vomiting or diarrhea  ECOG PS- 1 Pain scale- 0   Review of systems- Review of Systems  Constitutional:  Positive for malaise/fatigue. Negative for chills, fever and weight loss.  HENT:  Negative for congestion, ear discharge and nosebleeds.   Eyes:  Negative for blurred vision.  Respiratory:  Negative for cough, hemoptysis, sputum production, shortness of breath and wheezing.   Cardiovascular:  Negative for chest pain, palpitations, orthopnea and claudication.  Gastrointestinal:  Negative for abdominal pain, blood in stool, constipation, diarrhea, heartburn, melena, nausea and vomiting.  Genitourinary:  Negative for dysuria, flank pain, frequency, hematuria and urgency.  Musculoskeletal:  Negative for back pain, joint pain and myalgias.  Skin:  Negative for rash.  Neurological:  Negative for dizziness, tingling, focal weakness, seizures, weakness and headaches.  Endo/Heme/Allergies:  Does not bruise/bleed easily.  Psychiatric/Behavioral:  Negative for depression and suicidal ideas. The patient does not have  insomnia.       Allergies  Allergen Reactions   Lisinopril  Swelling     Past Medical History:  Diagnosis Date   Hypertension    Rheumatic fever 1986   has had echo  since then. no cardiac issues.     Past Surgical History:  Procedure Laterality Date   ABDOMINAL HYSTERECTOMY     AUGMENTATION MAMMAPLASTY Bilateral    breast lift   CARDIAC CATHETERIZATION  2002   normal, due to syncope and abnormal myowiew ([place])   COLONOSCOPY WITH PROPOFOL  N/A 09/15/2017   Procedure: COLONOSCOPY WITH PROPOFOL ;  Surgeon: Selena Daily, MD;  Location: [facility];  Service: Gastroenterology;  Laterality: N/A;   IR IMAGING GUIDED PORT INSERTION  05/11/2024   MASTOPEXY  2008   OOPHORECTOMY     secondary to scar tissue,  [facility]   RIGHT OOPHORECTOMY     TONSILLECTOMY     TUBAL LIGATION      Social History   Socioeconomic History   Marital status: Married    Spouse name: Not on file   Number of children: 2   Years of education: Not on file   Highest education level: Associate degree: academic program  Occupational History   Not on file  Tobacco Use   Smoking status: Never   Smokeless tobacco: Never  Vaping Use   Vaping status: Never Used  Substance and Sexual Activity   Alcohol use: Yes    Alcohol/week: 1.0 standard drink of alcohol    Types: 1 Glasses of wine per week    Comment: socially   Drug use: No   Sexual activity: Yes    Birth control/protection: None  Other Topics Concern   Not on file  Social History Narrative   Not on file   Social Drivers of Health   Financial Resource Strain: Low Risk  (11/16/2023)   Overall Financial Resource Strain (CARDIA)    Difficulty of Paying Living Expenses: Not hard at all  Food Insecurity: No Food Insecurity (05/05/2024)   Hunger Vital Sign    Worried About Running Out of Food in the Last Year: Never true    Ran Out of Food in the Last Year: Never true  Transportation Needs: No Transportation Needs (05/05/2024)   PRAPARE - Administrator, Civil Service (Medical): No    Lack of Transportation (Non-Medical): No  Physical Activity: Insufficiently Active (05/05/2024)   Exercise Vital  Sign    Days of Exercise per Week: 2 days    Minutes of Exercise per Session: 20 min  Stress: No Stress Concern Present (11/16/2023)   [employer] of Occupational Health - Occupational Stress Questionnaire    Feeling of Stress : Not at all  Social Connections: Moderately Integrated (11/16/2023)   Social Connection and Isolation Panel    Frequency of Communication with Friends and Family: More than three times a week    Frequency of Social Gatherings with Friends and Family: Twice a week    Attends Religious Services: More than 4 times per year    Active Member of [employer] or Organizations: No    Attends Banker Meetings: Not on file    Marital Status: Married  Catering manager Violence: Not At Risk (05/05/2024)   Humiliation, Afraid, Rape, and Kick questionnaire    Fear of Current or Ex-Partner: No    Emotionally Abused: No    Physically Abused: No    Sexually Abused: No    Family History  Problem Relation  Age of Onset   Heart disease Mother    Heart failure Mother    Hypertension Father    Hyperlipidemia Father    Heart disease Maternal Grandfather    Heart disease Paternal Grandmother    Stroke Paternal Grandmother 49   Cancer Paternal Grandfather        multiple myeloma   Breast cancer Neg Hx      Current Outpatient Medications:    acetaminophen (TYLENOL) 325 MG tablet, Take 650 mg by mouth every 6 (six) hours as needed., Disp: , Rfl:    amLODipine  (NORVASC ) 10 MG tablet, Take 1 tablet (10 mg total) by mouth daily., Disp: 90 tablet, Rfl: 1   cholecalciferol (VITAMIN D3) 25 MCG (1000 UNIT) tablet, Take 1,000 Units by mouth daily., Disp: , Rfl:    dexamethasone  (DECADRON ) 4 MG tablet, Take 2 tablets (8 mg total) by mouth daily. Start the day after chemotherapy for 2 days. Take with food., Disp: 30 tablet, Rfl: 1   diltiazem  (CARDIZEM ) 30 MG tablet, Take 1 tablet (30 mg total) by mouth as needed (rapid heart rate)., Disp: 90 tablet, Rfl: 2   escitalopram  (LEXAPRO) 10 MG tablet, Take 1 tablet (10 mg total) by mouth daily., Disp: 30 tablet, Rfl: 0   lactulose  (CHRONULAC ) 10 GM/15ML solution, Take 15-30 mLs (10-20 g total) by mouth 2 (two) times daily as needed for mild constipation., Disp: 236 mL, Rfl: 0   levofloxacin  (LEVAQUIN ) 750 MG tablet, Take 1 tablet (750 mg total) by mouth daily., Disp: 7 tablet, Rfl: 0   lidocaine -prilocaine  (EMLA ) cream, Apply to affected area once, Disp: 30 g, Rfl: 3   metoprolol  succinate (TOPROL -XL) 100 MG 24 hr tablet, Take 1 tablet (100 mg total) by mouth daily., Disp: 90 tablet, Rfl: 1   ondansetron  (ZOFRAN ) 8 MG tablet, Take 1 tablet (8 mg total) by mouth every 8 (eight) hours as needed for nausea or vomiting. Start on the third day after chemotherapy., Disp: 30 tablet, Rfl: 1   oxyCODONE  (OXY IR/ROXICODONE ) 5 MG immediate release tablet, Take 1 tablet (5 mg total) by mouth every 4 (four) hours as needed for severe pain (pain score 7-10)., Disp: 120 tablet, Rfl: 0   pantoprazole  (PROTONIX ) 20 MG tablet, Take 1 tablet (20 mg total) by mouth daily., Disp: 30 tablet, Rfl: 3   prochlorperazine  (COMPAZINE ) 10 MG tablet, Take 1 tablet (10 mg total) by mouth every 6 (six) hours as needed for nausea or vomiting., Disp: 30 tablet, Rfl: 1 No current facility-administered medications for this visit.  Facility-Administered Medications Ordered in Other Visits:    sodium chloride  flush (NS) 0.9 % injection 10 mL, 10 mL, Intravenous, Once, Avonne Boettcher, MD  Physical exam: There were no vitals filed for this visit. Physical Exam  Cardiovascular:     Rate and Rhythm: Normal rate and regular rhythm.     Heart sounds: Normal heart sounds.  Pulmonary:     Effort: Pulmonary effort is normal.     Breath sounds: Normal breath sounds.  Abdominal:     General: Bowel sounds are normal.     Palpations: Abdomen is soft.   Skin:    General: Skin is warm and dry.   Neurological:     Mental Status: She is alert and oriented to  person, place, and time.      I have personally reviewed labs listed below:    Latest Ref Rng & Units 06/05/2024    9:05 AM  CMP  Glucose 70 -  99 mg/dL 161   BUN 6 - 20 mg/dL 10   Creatinine 0.96 - 1.00 mg/dL 0.45   Sodium 409 - 811 mmol/L 133   Potassium 3.5 - 5.1 mmol/L 3.7   Chloride 98 - 111 mmol/L 103   CO2 22 - 32 mmol/L 23   Calcium  8.9 - 10.3 mg/dL 9.8   Total Protein 6.5 - 8.1 g/dL 6.5   Total Bilirubin 0.0 - 1.2 mg/dL 0.6   Alkaline Phos 38 - 126 U/L 101   AST 15 - 41 U/L 33   ALT 0 - 44 U/L 33       Latest Ref Rng & Units 06/05/2024    9:05 AM  CBC  WBC 4.0 - 10.5 K/uL 2.7   Hemoglobin 12.0 - 15.0 g/dL 91.4   Hematocrit 78.2 - 46.0 % 32.4   Platelets 150 - 400 K/uL 370    I have personally reviewed Radiology images listed below: No images are attached to the encounter.  DG Chest 2 View Result Date: 05/29/2024 CLINICAL DATA:  Fever for 2 days, history of lung carcinoma EXAM: CHEST - 2 VIEW COMPARISON:  PET-CT from 05/16/2024 FINDINGS: Cardiac shadows within normal limits. Right chest wall port is noted. The lungs are clear bilaterally. No focal infiltrate or effusion is seen. No bony abnormality is noted. IMPRESSION: No acute abnormality noted. Electronically Signed   By: Violeta Grey M.D.   On: 05/29/2024 04:03   NM PET Image Initial (PI) Skull Base To Thigh Result Date: 05/16/2024 CLINICAL DATA:  Initial treatment strategy for colon cancer. EXAM: NUCLEAR MEDICINE PET SKULL BASE TO THIGH TECHNIQUE: 8.4 mCi F-18 FDG was injected intravenously. Full-ring PET imaging was performed from the skull base to thigh after the radiotracer. CT data was obtained and used for attenuation correction and anatomic localization. Fasting blood glucose: 88 mg/dl COMPARISON:  Multiple exams, including MRI 05/04/2024 FINDINGS: Mediastinal blood pool activity: SUV max 2.5 Liver activity: SUV max NA NECK: No significant abnormal hypermetabolic activity in this region. Incidental CT findings:  None. CHEST: 0.5 cm left upper lobe pulmonary nodule on image 38 series 6 has a maximum SUV of 1.6, is technically too small to characterize by PET-CT. This nodule is not readily appreciated on the prior exam from 01/04/2014. 4 by 3 mm right lower lobe nodule on image 58 series 6 is not appreciably metabolic but below sensitive PET-CT size thresholds. This nodule is stable from 01/04/2014 and hence considered benign. Incidental CT findings: Right Port-A-Cath tip: Cavoatrial junction. ABDOMEN/PELVIS: Two large hepatic metastatic lesions are present, this includes a centrally necrotic 8.7 cm mass centered in segment 7 (maximum SUV 16.7) and a 12.9 cm mass centered inferiorly in the right hepatic lobe with maximum SUV 12.9. Hypermetabolic circumferential mass involving a 5 cm segment of the sigmoid colon has maximum SUV 26.2, compatible with primary malignancy. Adjacent hypermetabolic sigmoid lymph nodes near the mass are observed measuring up to 0.9 cm with maximum SUV in these lymph nodes of 5.3, compatible with local nodal involvement. Incidental CT findings: None. SKELETON: No significant abnormal hypermetabolic activity in this region. Incidental CT findings: None. IMPRESSION: 1. Hypermetabolic circumferential mass involving a 5 cm segment of the sigmoid colon is compatible with primary malignancy. Adjacent hypermetabolic sigmoid lymph nodes near the mass are compatible with local nodal involvement. 2. Two large hepatic metastatic lesions are present, this includes a centrally necrotic 8.7 cm mass centered in segment 7 and a 12.9 cm mass centered inferiorly in the right hepatic  lobe. 3. 0.5 cm left upper lobe pulmonary nodule has a maximum SUV of 1.6, is technically too small to characterize by PET-CT, and was not readily appreciable on the 01/04/2014 CT chest. This nodule is too small for biopsy and remains indeterminate for malignancy. Surveillance CT imaging of the chest is recommended. Electronically Signed    By: Freida Jes M.D.   On: 05/16/2024 15:09   IR IMAGING GUIDED PORT INSERTION Result Date: 05/11/2024 INDICATION: 63 year old female referred for port catheter EXAM: IMAGE GUIDED PORT CATHETER MEDICATIONS: None ANESTHESIA/SEDATION: Moderate (conscious) sedation was employed during this procedure. A total of Versed  1.5 mg and Fentanyl  75 mcg was administered intravenously. Moderate Sedation Time: 20 minutes. The patient's level of consciousness and vital signs were monitored continuously by radiology nursing throughout the procedure under my direct supervision. FLUOROSCOPY TIME:  Reference air kerma: 1.7 mGy COMPLICATIONS: None PROCEDURE: Informed written consent was obtained from the patient after a discussion of the risks, benefits, and alternatives to treatment. Questions regarding the procedure were encouraged and answered. The right neck and chest were prepped with chlorhexidine in a sterile fashion, and a sterile drape was applied covering the operative field. Maximum barrier sterile technique with sterile gowns and gloves were used for the procedure. A timeout was performed prior to the initiation of the procedure. Ultrasound survey was performed with images stored and sent to PACs. Right IJ vein documented to be patent. The right neck and chest was prepped with chlorhexidine, and draped in the usual sterile fashion using maximum barrier technique (cap and mask, sterile gown, sterile gloves, large sterile sheet, hand hygiene and cutaneous antiseptic). Local anesthesia was attained by infiltration with 1% lidocaine  without epinephrine. Ultrasound demonstrated patency of the right internal jugular vein, and this was documented with an image. Under real-time ultrasound guidance, this vein was accessed with a 21 gauge micropuncture needle and image documentation was performed. A small dermatotomy was made at the access site with an 11 scalpel. A 0.018 wire was advanced into the SVC and used to estimate  the length of the internal catheter. The access needle exchanged for a 53F micropuncture vascular sheath. The 0.018 wire was then removed and a 0.035 wire advanced into the IVC. An appropriate location for the subcutaneous reservoir was selected below the clavicle and an incision was made through the skin and underlying soft tissues. The subcutaneous tissues were then dissected using a combination of blunt and sharp surgical technique and a pocket was formed. A single lumen power injectable portacatheter was then tunneled through the subcutaneous tissues from the pocket to the dermatotomy and the port reservoir placed within the subcutaneous pocket. The venous access site was then serially dilated and a peel away vascular sheath placed over the wire. The wire was removed and the port catheter advanced into position under fluoroscopic guidance. The catheter tip is positioned in the cavoatrial junction. This was documented with a spot image. The portacatheter was then tested and found to flush and aspirate well. The port was flushed with saline followed by 100 units/mL heparinized saline. The pocket was then closed in two layers using first subdermal inverted interrupted absorbable sutures followed by a running subcuticular suture. The epidermis was then sealed with Dermabond. The dermatotomy at the venous access site was also seal with Dermabond. Patient tolerated the procedure well and remained hemodynamically stable throughout. No complications encountered and no significant blood loss encountered IMPRESSION: Status post image guided right IJ port catheter placement Signed, Marciano Settles. Mabel Savage, DO, ABVM,  RPVI [facility] Electronically Signed   By: Myrlene Asper D.O.   On: 05/11/2024 13:36     Assessment and plan- Patient is a 63 y.o. female with history of metastatic colon cancer with liver metastases here for on treatment assessment prior to cycle 2 of  palliative FOLFOX Avastin  chemotherapy  After 1 cycle of chemotherapy patient's white cell countHas gone down to 2.7 today with an ANC of 1.1.  I am deferring chemotherapy out by 1 week.  Also given the fact that patient did not have any evidence of KRAS or NRAS mutations and patient has a left-sided tumor I will drop her a Avastin  and switch her to panitumumab at this time.  She will be receiving FOLFOX panitumumab chemotherapy every 2 weeks until progression or toxicity.  Discussed risks and benefits of panitumumab including all but not limited to skin rash diarrhea.  Patient understands and agrees to proceed as planned next week.  Also given her neutropenia I will plan to add udenyca with every cycle of chemotherapy.  Checking for DPD testing today  Chemo induced anemia: I am checking ferritin and iron studies B12 folate today.  I suspect patient is having tumor fevers given the significant tumor burden in her liver.  Infectious workup so far has been negative including blood cultures urinalysis and chest x-ray.  She will pick up her next prescription for Levaquin  but hold off on taking it at this time.  If she were to develop any recurrent fever I would like her to take the Levaquin  again especially given her neutropenia but I would like to get ID input at that time as well.  Hyponatremia: Improved with IV fluids.  Continue to monitor   Visit Diagnosis 1. Chemotherapy induced neutropenia (HCC)   2. Colon cancer metastasized to liver (HCC)   3. Neoplasm related pain   4. Hyponatremia      Dr. Seretha Dance, MD, MPH [facility] [phone] 06/05/2024 9:11 AM

## 2024-06-05 NOTE — Progress Notes (Signed)
 Patient states she's feeling pretty good, but does have a bit of concern regarding her BP running low.

## 2024-06-05 NOTE — Telephone Encounter (Signed)
 Clinical Social Work was referred by [employer] regarding anxiety/depression.  CSW attempted to contact patient by phone.  Left voicemail with contact information and request for return call.

## 2024-06-06 ENCOUNTER — Inpatient Hospital Stay

## 2024-06-06 ENCOUNTER — Encounter: Payer: Self-pay | Admitting: Oncology

## 2024-06-06 ENCOUNTER — Other Ambulatory Visit: Payer: Self-pay

## 2024-06-06 LAB — CEA: CEA: 327 ng/mL — ABNORMAL HIGH (ref 0.0–4.7)

## 2024-06-06 NOTE — Progress Notes (Signed)
 [facility] CSW Progress Note  Clinical Child psychotherapist contacted patient by phone per referral from [employer] to address depression/anxiety.  Patient confirmed that NP prescribed escitalopram.  Patient stated she does not require the medication at this time due to her improved mood.  She said she does not feel depressed or anxious and will keep her medical team informed of any changes.    Kennth Peal, LCSW Clinical Social Worker [facility]

## 2024-06-07 ENCOUNTER — Inpatient Hospital Stay

## 2024-06-07 ENCOUNTER — Other Ambulatory Visit: Payer: Self-pay

## 2024-06-08 ENCOUNTER — Telehealth: Payer: Self-pay | Admitting: *Deleted

## 2024-06-08 NOTE — Telephone Encounter (Signed)
 patient sent message that she wants the [facility] and email it to her.  She says that the matrix does not have it either.    I forwarded the FMLA to the patient email. I called again to matrix and they are not working today and I sent it again to matrix again by faxing.  On my side each time that I have sent it to matrix went through and I have a transmission saying that it went through for both times I have done it.

## 2024-06-12 ENCOUNTER — Inpatient Hospital Stay

## 2024-06-12 ENCOUNTER — Encounter: Payer: Self-pay | Admitting: Oncology

## 2024-06-12 ENCOUNTER — Inpatient Hospital Stay (HOSPITAL_BASED_OUTPATIENT_CLINIC_OR_DEPARTMENT_OTHER): Admitting: Oncology

## 2024-06-12 ENCOUNTER — Other Ambulatory Visit: Payer: Self-pay

## 2024-06-12 VITALS — BP 135/84 | HR 65 | Temp 98.4°F | Resp 18 | Ht 63.0 in | Wt 157.0 lb

## 2024-06-12 DIAGNOSIS — C787 Secondary malignant neoplasm of liver and intrahepatic bile duct: Secondary | ICD-10-CM

## 2024-06-12 DIAGNOSIS — C189 Malignant neoplasm of colon, unspecified: Secondary | ICD-10-CM

## 2024-06-12 DIAGNOSIS — Z5111 Encounter for antineoplastic chemotherapy: Secondary | ICD-10-CM

## 2024-06-12 DIAGNOSIS — Z5112 Encounter for antineoplastic immunotherapy: Secondary | ICD-10-CM

## 2024-06-12 LAB — CBC WITH DIFFERENTIAL (CANCER CENTER ONLY)
Abs Immature Granulocytes: 0.04 10*3/uL (ref 0.00–0.07)
Basophils Absolute: 0.1 10*3/uL (ref 0.0–0.1)
Basophils Relative: 2 %
Eosinophils Absolute: 0.1 10*3/uL (ref 0.0–0.5)
Eosinophils Relative: 3 %
HCT: 36.2 % (ref 36.0–46.0)
Hemoglobin: 11.7 g/dL — ABNORMAL LOW (ref 12.0–15.0)
Immature Granulocytes: 1 %
Lymphocytes Relative: 35 %
Lymphs Abs: 1.6 10*3/uL (ref 0.7–4.0)
MCH: 30.2 pg (ref 26.0–34.0)
MCHC: 32.3 g/dL (ref 30.0–36.0)
MCV: 93.5 fL (ref 80.0–100.0)
Monocytes Absolute: 0.7 10*3/uL (ref 0.1–1.0)
Monocytes Relative: 16 %
Neutro Abs: 2 10*3/uL (ref 1.7–7.7)
Neutrophils Relative %: 43 %
Platelet Count: 480 10*3/uL — ABNORMAL HIGH (ref 150–400)
RBC: 3.87 MIL/uL (ref 3.87–5.11)
RDW: 13.2 % (ref 11.5–15.5)
WBC Count: 4.6 10*3/uL (ref 4.0–10.5)
nRBC: 0 % (ref 0.0–0.2)

## 2024-06-12 LAB — CMP (CANCER CENTER ONLY)
ALT: 24 U/L (ref 0–44)
AST: 30 U/L (ref 15–41)
Albumin: 3.6 g/dL (ref 3.5–5.0)
Alkaline Phosphatase: 106 U/L (ref 38–126)
Anion gap: 9 (ref 5–15)
BUN: 12 mg/dL (ref 6–20)
CO2: 24 mmol/L (ref 22–32)
Calcium: 10.3 mg/dL (ref 8.9–10.3)
Chloride: 102 mmol/L (ref 98–111)
Creatinine: 0.61 mg/dL (ref 0.44–1.00)
GFR, Estimated: >60 mL/min (ref >60)
Glucose, Bld: 108 mg/dL — ABNORMAL HIGH (ref 70–99)
Potassium: 4.2 mmol/L (ref 3.5–5.1)
Sodium: 135 mmol/L (ref 135–145)
Total Bilirubin: 0.5 mg/dL (ref 0.0–1.2)
Total Protein: 6.9 g/dL (ref 6.5–8.1)

## 2024-06-12 LAB — MAGNESIUM: Magnesium: 2.2 mg/dL (ref 1.7–2.4)

## 2024-06-12 MED ORDER — PALONOSETRON HCL INJECTION 0.25 MG/5ML
0.2500 mg | Freq: Once | INTRAVENOUS | Status: AC
  Administered 2024-06-12: 0.25 mg via INTRAVENOUS
  Filled 2024-06-12: qty 5

## 2024-06-12 MED ORDER — DEXAMETHASONE SODIUM PHOSPHATE 10 MG/ML IJ SOLN
10.0000 mg | Freq: Once | INTRAMUSCULAR | Status: AC
  Administered 2024-06-12: 10 mg via INTRAVENOUS
  Filled 2024-06-12: qty 1

## 2024-06-12 MED ORDER — FLUOROURACIL CHEMO INJECTION 2.5 GM/50ML
400.0000 mg/m2 | Freq: Once | INTRAVENOUS | Status: AC
  Administered 2024-06-12: 700 mg via INTRAVENOUS
  Filled 2024-06-12: qty 14

## 2024-06-12 MED ORDER — SODIUM CHLORIDE 0.9 % IV SOLN
2400.0000 mg/m2 | INTRAVENOUS | Status: DC
  Administered 2024-06-12: 4300 mg via INTRAVENOUS
  Filled 2024-06-12: qty 86

## 2024-06-12 MED ORDER — SODIUM CHLORIDE 0.9 % IV SOLN
6.0000 mg/kg | Freq: Once | INTRAVENOUS | Status: AC
  Administered 2024-06-12: 400 mg via INTRAVENOUS
  Filled 2024-06-12: qty 20

## 2024-06-12 MED ORDER — CLINDAMYCIN PHOSPHATE 1 % EX LOTN
TOPICAL_LOTION | Freq: Two times a day (BID) | CUTANEOUS | 0 refills | Status: DC
  Filled 2024-06-12: qty 60, 30d supply, fill #0

## 2024-06-12 MED ORDER — SODIUM CHLORIDE 0.9 % IV SOLN
INTRAVENOUS | Status: DC
  Filled 2024-06-12: qty 250

## 2024-06-12 MED ORDER — OXALIPLATIN CHEMO INJECTION 100 MG/20ML
85.0000 mg/m2 | Freq: Once | INTRAVENOUS | Status: AC
  Administered 2024-06-12: 150 mg via INTRAVENOUS
  Filled 2024-06-12: qty 10

## 2024-06-12 MED ORDER — DEXTROSE 5 % IV SOLN
INTRAVENOUS | Status: DC
  Filled 2024-06-12: qty 250

## 2024-06-12 MED ORDER — LEUCOVORIN CALCIUM INJECTION 350 MG
400.0000 mg/m2 | Freq: Once | INTRAVENOUS | Status: AC
  Administered 2024-06-12: 720 mg via INTRAVENOUS
  Filled 2024-06-12: qty 18.5

## 2024-06-12 NOTE — Patient Instructions (Signed)

## 2024-06-12 NOTE — Progress Notes (Signed)
 Patient says that she is feeling good this week. Her appetite is good, denies having any symptoms at all.

## 2024-06-12 NOTE — Progress Notes (Signed)
 Hematology/Oncology Consult note [facility]  [phone] [phone]  Patient Care Team: Marylynn Verneita CROME, MD as PCP - General (Internal Medicine) Maurie Rayfield BIRCH, RN as Oncology Nurse Navigator Melanee Annah BROCKS, MD as Consulting Physician (Oncology)   Name of the patient: Carla Mckinney  [id number]  07/15/1961   Date of visit: 06/12/24  Diagnosis-stage IV adenocarcinoma of the colon with liver metastases  Chief complaint/ Reason for visit-on treatment assessment prior to cycle 2 of palliative FOLFOX panitumumab chemotherapy  Heme/Onc history: patient is a 63 year old female with a past medical history significant for hypercalcemia due to hyperparathyroidism, hyperlipidemia among other medical problems. She was noted to have bright red blood in her stools along with symptoms of unintentional weight loss which has been going on for the last 6 weeks.  She has lost about 20 pounds of weight.  Her last colonoscopy in 2018 was unremarkable.  Patient was seen by [facility] and colonoscopy is planned a month from now.  Patient underwent CT abdomen and pelvis with contrast on 05/03/2024 for symptoms of right upper quadrant abdominal pain which showed large enhancing masses in the liver measuring up to 12.5 cm in the right hepatic lobe and 8.5 cm in the posterior right hepatic dome.  No evidence of intra-abdominal adenopathy.  This was followed by MRI abdomen with and without contrast which was ordered by me on 05/04/2024 which showed extremely bulky heterogeneously hypoenhancing masses occupying the majority of the right lobe of the liver measuring 14.9 x 12.9 x 9.1 cm.  Occasional incidental benign cyst throughout the liver.  Pancreas, spleen and adrenal glands and kidney appeared normal.  Stomach and intra-abdominal lymph nodes appeared normal.  Rectosigmoid mass was noted along with mesenteric adenopathy concerning for primary origin of these metastases.   aFP was normal.  CEA was elevated at 305.  CA 19-9 elevated at 176.MMR stable.  Plan is to proceed with FOLFOX Avastin  chemotherapy for first-line    NGS testing showed T p53, APC, BRAF, APC, PHLPP 1, PPP2R2A SMAD4 mutations.  K-ras and NRAS negative.  MSI stable.  MMR normal.  Tumor mutational burden low.    Interval history-patient feels significantly better and almost back to her baseline after getting a break for 1 week from chemotherapy.  No further episodes of fever.  She is off antibiotics.  ECOG PS- 1 Pain scale- 0   Review of systems- Review of Systems  Constitutional:  Negative for chills, fever, malaise/fatigue and weight loss.  HENT:  Negative for congestion, ear discharge and nosebleeds.   Eyes:  Negative for blurred vision.  Respiratory:  Negative for cough, hemoptysis, sputum production, shortness of breath and wheezing.   Cardiovascular:  Negative for chest pain, palpitations, orthopnea and claudication.  Gastrointestinal:  Negative for abdominal pain, blood in stool, constipation, diarrhea, heartburn, melena, nausea and vomiting.  Genitourinary:  Negative for dysuria, flank pain, frequency, hematuria and urgency.  Musculoskeletal:  Negative for back pain, joint pain and myalgias.  Skin:  Negative for rash.  Neurological:  Negative for dizziness, tingling, focal weakness, seizures, weakness and headaches.  Endo/Heme/Allergies:  Does not bruise/bleed easily.  Psychiatric/Behavioral:  Negative for depression and suicidal ideas. The patient does not have insomnia.       Allergies  Allergen Reactions   Lisinopril  Swelling     Past Medical History:  Diagnosis Date   Hypertension    Rheumatic fever 1986   has had echo since then. no cardiac issues.  Past Surgical History:  Procedure Laterality Date   ABDOMINAL HYSTERECTOMY     AUGMENTATION MAMMAPLASTY Bilateral    breast lift   CARDIAC CATHETERIZATION  2002   normal, due to syncope and abnormal myowiew  ([place])   COLONOSCOPY WITH PROPOFOL  N/A 09/15/2017   Procedure: COLONOSCOPY WITH PROPOFOL ;  Surgeon: Unk Corinn Skiff, MD;  Location: [facility];  Service: Gastroenterology;  Laterality: N/A;   IR IMAGING GUIDED PORT INSERTION  05/11/2024   MASTOPEXY  2008   OOPHORECTOMY     secondary to scar tissue,  [facility]   RIGHT OOPHORECTOMY     TONSILLECTOMY     TUBAL LIGATION      Social History   Socioeconomic History   Marital status: Married    Spouse name: Not on file   Number of children: 2   Years of education: Not on file   Highest education level: Associate degree: academic program  Occupational History   Not on file  Tobacco Use   Smoking status: Never   Smokeless tobacco: Never  Vaping Use   Vaping status: Never Used  Substance and Sexual Activity   Alcohol use: Yes    Alcohol/week: 1.0 standard drink of alcohol    Types: 1 Glasses of wine per week    Comment: socially   Drug use: No   Sexual activity: Yes    Birth control/protection: None  Other Topics Concern   Not on file  Social History Narrative   Not on file   Social Drivers of Health   Financial Resource Strain: Low Risk  (11/16/2023)   Overall Financial Resource Strain (CARDIA)    Difficulty of Paying Living Expenses: Not hard at all  Food Insecurity: No Food Insecurity (05/05/2024)   Hunger Vital Sign    Worried About Running Out of Food in the Last Year: Never true    Ran Out of Food in the Last Year: Never true  Transportation Needs: No Transportation Needs (05/05/2024)   PRAPARE - Administrator, Civil Service (Medical): No    Lack of Transportation (Non-Medical): No  Physical Activity: Insufficiently Active (05/05/2024)   Exercise Vital Sign    Days of Exercise per Week: 2 days    Minutes of Exercise per Session: 20 min  Stress: No Stress Concern Present (11/16/2023)   [employer] of Occupational Health - Occupational Stress Questionnaire    Feeling of Stress : Not at all   Social Connections: Moderately Integrated (11/16/2023)   Social Connection and Isolation Panel    Frequency of Communication with Friends and Family: More than three times a week    Frequency of Social Gatherings with Friends and Family: Twice a week    Attends Religious Services: More than 4 times per year    Active Member of [employer] or Organizations: No    Attends Engineer, structural: Not on file    Marital Status: Married  Catering manager Violence: Not At Risk (05/05/2024)   Humiliation, Afraid, Rape, and Kick questionnaire    Fear of Current or Ex-Partner: No    Emotionally Abused: No    Physically Abused: No    Sexually Abused: No    Family History  Problem Relation Age of Onset   Heart disease Mother    Heart failure Mother    Hypertension Father    Hyperlipidemia Father    Heart disease Maternal Grandfather    Heart disease Paternal Grandmother    Stroke Paternal Grandmother 68   Cancer  Paternal Grandfather        multiple myeloma   Breast cancer Neg Hx      Current Outpatient Medications:    clindamycin (CLEOCIN T) 1 % lotion, Apply topically 2 (two) times daily., Disp: 60 mL, Rfl: 0   acetaminophen (TYLENOL) 325 MG tablet, Take 650 mg by mouth every 6 (six) hours as needed., Disp: , Rfl:    amLODipine  (NORVASC ) 10 MG tablet, Take 1 tablet (10 mg total) by mouth daily., Disp: 90 tablet, Rfl: 1   cholecalciferol (VITAMIN D3) 25 MCG (1000 UNIT) tablet, Take 1,000 Units by mouth daily., Disp: , Rfl:    dexamethasone  (DECADRON ) 4 MG tablet, Take 2 tablets (8 mg total) by mouth daily. Start the day after chemotherapy for 2 days. Take with food., Disp: 30 tablet, Rfl: 1   diltiazem  (CARDIZEM ) 30 MG tablet, Take 1 tablet (30 mg total) by mouth as needed (rapid heart rate)., Disp: 90 tablet, Rfl: 2   escitalopram  (LEXAPRO ) 10 MG tablet, Take 1 tablet (10 mg total) by mouth daily., Disp: 30 tablet, Rfl: 0   lactulose  (CHRONULAC ) 10 GM/15ML solution, Take 15-30 mLs  (10-20 g total) by mouth 2 (two) times daily as needed for mild constipation., Disp: 236 mL, Rfl: 0   levofloxacin  (LEVAQUIN ) 750 MG tablet, Take 1 tablet (750 mg total) by mouth daily., Disp: 7 tablet, Rfl: 0   lidocaine -prilocaine  (EMLA ) cream, Apply to affected area once, Disp: 30 g, Rfl: 3   metoprolol  succinate (TOPROL -XL) 100 MG 24 hr tablet, Take 1 tablet (100 mg total) by mouth daily., Disp: 90 tablet, Rfl: 1   ondansetron  (ZOFRAN ) 8 MG tablet, Take 1 tablet (8 mg total) by mouth every 8 (eight) hours as needed for nausea or vomiting. Start on the third day after chemotherapy., Disp: 30 tablet, Rfl: 1   oxyCODONE  (OXY IR/ROXICODONE ) 5 MG immediate release tablet, Take 1 tablet (5 mg total) by mouth every 4 (four) hours as needed for severe pain (pain score 7-10)., Disp: 120 tablet, Rfl: 0   pantoprazole  (PROTONIX ) 20 MG tablet, Take 1 tablet (20 mg total) by mouth daily., Disp: 30 tablet, Rfl: 3   prochlorperazine  (COMPAZINE ) 10 MG tablet, Take 1 tablet (10 mg total) by mouth every 6 (six) hours as needed for nausea or vomiting., Disp: 30 tablet, Rfl: 1 No current facility-administered medications for this visit.  Facility-Administered Medications Ordered in Other Visits:    0.9 %  sodium chloride  infusion, , Intravenous, Continuous, Melanee Annah BROCKS, MD, Last Rate: 10 mL/hr at 06/12/24 1025, New Bag at 06/12/24 1025   dextrose  5 % solution, , Intravenous, Continuous, Melanee Annah BROCKS, MD, Last Rate: 10 mL/hr at 06/12/24 1213, New Bag at 06/12/24 1213   fluorouracil  (ADRUCIL ) 4,300 mg in sodium chloride  0.9 % 64 mL chemo infusion, 2,400 mg/m2 (Treatment Plan Recorded), Intravenous, 1 day or 1 dose, Melanee Annah BROCKS, MD   fluorouracil  (ADRUCIL ) chemo injection 700 mg, 400 mg/m2 (Treatment Plan Recorded), Intravenous, Once, Melanee Annah BROCKS, MD   leucovorin  720 mg in dextrose  5 % 250 mL infusion, 400 mg/m2 (Treatment Plan Recorded), Intravenous, Once, Melanee Annah BROCKS, MD, Last Rate: 143 mL/hr at 06/12/24  1215, 720 mg at 06/12/24 1215   oxaliplatin  (ELOXATIN ) 150 mg in dextrose  5 % 500 mL chemo infusion, 85 mg/m2 (Treatment Plan Recorded), Intravenous, Once, Melanee Annah BROCKS, MD, Last Rate: 265 mL/hr at 06/12/24 1216, 150 mg at 06/12/24 1216  Physical exam:  Vitals:   06/12/24 0938  BP: 135/84  Pulse: 65  Resp: 18  Temp: 98.4 F (36.9 C)  TempSrc: Tympanic  SpO2: 99%  Weight: 157 lb (71.2 kg)  Height: 5' 3 (1.6 m)   Physical Exam  Cardiovascular:     Rate and Rhythm: Normal rate and regular rhythm.     Heart sounds: Normal heart sounds.  Pulmonary:     Effort: Pulmonary effort is normal.     Breath sounds: Normal breath sounds.  Abdominal:     General: Bowel sounds are normal.     Palpations: Abdomen is soft.   Skin:    General: Skin is warm and dry.   Neurological:     Mental Status: She is alert and oriented to person, place, and time.      I have personally reviewed labs listed below:    Latest Ref Rng & Units 06/12/2024    9:25 AM  CMP  Glucose 70 - 99 mg/dL 891   BUN 6 - 20 mg/dL 12   Creatinine 9.55 - 1.00 mg/dL 9.38   Sodium 864 - 854 mmol/L 135   Potassium 3.5 - 5.1 mmol/L 4.2   Chloride 98 - 111 mmol/L 102   CO2 22 - 32 mmol/L 24   Calcium  8.9 - 10.3 mg/dL 89.6   Total Protein 6.5 - 8.1 g/dL 6.9   Total Bilirubin 0.0 - 1.2 mg/dL 0.5   Alkaline Phos 38 - 126 U/L 106   AST 15 - 41 U/L 30   ALT 0 - 44 U/L 24       Latest Ref Rng & Units 06/12/2024    9:25 AM  CBC  WBC 4.0 - 10.5 K/uL 4.6   Hemoglobin 12.0 - 15.0 g/dL 88.2   Hematocrit 63.9 - 46.0 % 36.2   Platelets 150 - 400 K/uL 480    I have personally reviewed Radiology images listed below: No images are attached to the encounter.  DG Chest 2 View Result Date: 05/29/2024 CLINICAL DATA:  Fever for 2 days, history of lung carcinoma EXAM: CHEST - 2 VIEW COMPARISON:  PET-CT from 05/16/2024 FINDINGS: Cardiac shadows within normal limits. Right chest wall port is noted. The lungs are clear  bilaterally. No focal infiltrate or effusion is seen. No bony abnormality is noted. IMPRESSION: No acute abnormality noted. Electronically Signed   By: Oneil Devonshire M.D.   On: 05/29/2024 04:03   NM PET Image Initial (PI) Skull Base To Thigh Result Date: 05/16/2024 CLINICAL DATA:  Initial treatment strategy for colon cancer. EXAM: NUCLEAR MEDICINE PET SKULL BASE TO THIGH TECHNIQUE: 8.4 mCi F-18 FDG was injected intravenously. Full-ring PET imaging was performed from the skull base to thigh after the radiotracer. CT data was obtained and used for attenuation correction and anatomic localization. Fasting blood glucose: 88 mg/dl COMPARISON:  Multiple exams, including MRI 05/04/2024 FINDINGS: Mediastinal blood pool activity: SUV max 2.5 Liver activity: SUV max NA NECK: No significant abnormal hypermetabolic activity in this region. Incidental CT findings: None. CHEST: 0.5 cm left upper lobe pulmonary nodule on image 38 series 6 has a maximum SUV of 1.6, is technically too small to characterize by PET-CT. This nodule is not readily appreciated on the prior exam from 01/04/2014. 4 by 3 mm right lower lobe nodule on image 58 series 6 is not appreciably metabolic but below sensitive PET-CT size thresholds. This nodule is stable from 01/04/2014 and hence considered benign. Incidental CT findings: Right Port-A-Cath tip: Cavoatrial junction. ABDOMEN/PELVIS: Two large hepatic metastatic lesions are present, this includes a  centrally necrotic 8.7 cm mass centered in segment 7 (maximum SUV 16.7) and a 12.9 cm mass centered inferiorly in the right hepatic lobe with maximum SUV 12.9. Hypermetabolic circumferential mass involving a 5 cm segment of the sigmoid colon has maximum SUV 26.2, compatible with primary malignancy. Adjacent hypermetabolic sigmoid lymph nodes near the mass are observed measuring up to 0.9 cm with maximum SUV in these lymph nodes of 5.3, compatible with local nodal involvement. Incidental CT findings: None.  SKELETON: No significant abnormal hypermetabolic activity in this region. Incidental CT findings: None. IMPRESSION: 1. Hypermetabolic circumferential mass involving a 5 cm segment of the sigmoid colon is compatible with primary malignancy. Adjacent hypermetabolic sigmoid lymph nodes near the mass are compatible with local nodal involvement. 2. Two large hepatic metastatic lesions are present, this includes a centrally necrotic 8.7 cm mass centered in segment 7 and a 12.9 cm mass centered inferiorly in the right hepatic lobe. 3. 0.5 cm left upper lobe pulmonary nodule has a maximum SUV of 1.6, is technically too small to characterize by PET-CT, and was not readily appreciable on the 01/04/2014 CT chest. This nodule is too small for biopsy and remains indeterminate for malignancy. Surveillance CT imaging of the chest is recommended. Electronically Signed   By: Ryan Salvage M.D.   On: 05/16/2024 15:09     Assessment and plan- Patient is a 63 y.o. 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  She only received 1 cycle of a Avastin .  White cell count and neutrophil counts have improved after giving her a break from chemotherapy for 1 week.  I will be adding Udenyca to her chemotherapy moving forward which she may not require with every cycle and may needed every other cycle.  I have asked her to take Claritin starting tomorrow for 3 to 4 days to prevent udenyca induced use body aches.  I am sending her a prescription for clindamycin lotion preventatively in case she develops panitumumab induced skin rash  Chemo induced anemia: Stable continue to monitor ferritin and iron studies B12 and folate levels normal.  Hyponatremia:  Resolved continue to monitor   Visit Diagnosis 1. Colon cancer metastasized to liver (HCC)   2. Encounter for antineoplastic chemotherapy   3. Encounter for monoclonal antibody treatment for malignancy      Dr. Annah Skene, MD, MPH [facility] at [facility] [phone] 06/12/2024 12:49 PM

## 2024-06-13 ENCOUNTER — Other Ambulatory Visit: Payer: Self-pay

## 2024-06-14 ENCOUNTER — Encounter: Admitting: Pediatrics

## 2024-06-14 ENCOUNTER — Encounter

## 2024-06-14 ENCOUNTER — Inpatient Hospital Stay

## 2024-06-14 ENCOUNTER — Encounter: Payer: Self-pay | Admitting: Oncology

## 2024-06-14 ENCOUNTER — Telehealth: Payer: Self-pay

## 2024-06-14 ENCOUNTER — Ambulatory Visit

## 2024-06-14 VITALS — BP 140/79 | HR 72 | Temp 97.9°F | Resp 18

## 2024-06-14 DIAGNOSIS — Z5112 Encounter for antineoplastic immunotherapy: Secondary | ICD-10-CM | POA: Diagnosis not present

## 2024-06-14 DIAGNOSIS — C189 Malignant neoplasm of colon, unspecified: Secondary | ICD-10-CM

## 2024-06-14 LAB — MISC LABCORP TEST (SEND OUT): Labcorp test code: 512275

## 2024-06-14 MED ORDER — SODIUM CHLORIDE 0.9% FLUSH
10.0000 mL | INTRAVENOUS | Status: DC | PRN
  Administered 2024-06-14: 10 mL
  Filled 2024-06-14: qty 10

## 2024-06-14 MED ORDER — HEPARIN SOD (PORK) LOCK FLUSH 100 UNIT/ML IV SOLN
500.0000 [IU] | Freq: Once | INTRAVENOUS | Status: AC | PRN
  Administered 2024-06-14: 500 [IU]
  Filled 2024-06-14: qty 5

## 2024-06-14 MED ORDER — PEGFILGRASTIM-JMDB 6 MG/0.6ML ~~LOC~~ SOSY
6.0000 mg | PREFILLED_SYRINGE | Freq: Once | SUBCUTANEOUS | Status: AC
  Administered 2024-06-14: 6 mg via SUBCUTANEOUS

## 2024-06-14 NOTE — Patient Instructions (Signed)

## 2024-06-14 NOTE — Telephone Encounter (Signed)
 Fax received from [employer] for completion of question #13 which states interval of intermittent FMLA.    Updated and faxed to [employer]

## 2024-06-15 ENCOUNTER — Encounter: Payer: Self-pay | Admitting: Oncology

## 2024-06-15 NOTE — Progress Notes (Signed)
 Patient enrolled in [facility] services on 06/14/2024.  Initial behavioral health evaluation is scheduled for 07/03/2024.

## 2024-06-19 ENCOUNTER — Telehealth: Payer: Self-pay

## 2024-06-19 NOTE — Telephone Encounter (Signed)
 Duplicate FMLA paperwork received from [employer].  Clarification request received last week and updated form was faxed to [employer] on 06/14/24.  No contact number on new request received today.

## 2024-06-20 ENCOUNTER — Inpatient Hospital Stay: Attending: Oncology

## 2024-06-20 DIAGNOSIS — Z79899 Other long term (current) drug therapy: Secondary | ICD-10-CM | POA: Insufficient documentation

## 2024-06-20 DIAGNOSIS — C787 Secondary malignant neoplasm of liver and intrahepatic bile duct: Secondary | ICD-10-CM | POA: Insufficient documentation

## 2024-06-20 DIAGNOSIS — C187 Malignant neoplasm of sigmoid colon: Secondary | ICD-10-CM | POA: Insufficient documentation

## 2024-06-20 DIAGNOSIS — Z5111 Encounter for antineoplastic chemotherapy: Secondary | ICD-10-CM | POA: Insufficient documentation

## 2024-06-20 DIAGNOSIS — Z5189 Encounter for other specified aftercare: Secondary | ICD-10-CM | POA: Insufficient documentation

## 2024-06-20 DIAGNOSIS — Z5112 Encounter for antineoplastic immunotherapy: Secondary | ICD-10-CM | POA: Insufficient documentation

## 2024-06-20 NOTE — Progress Notes (Signed)
 [facility] CSW Progress Note  Clinical Child psychotherapist contacted patient by phone to follow-up on emotional support.    Interventions: Patient interviewed and appropriate assessments performed: brief mental health assessment .  Patient stated she is not feeling depressed or anxious at this time.      Follow Up Plan:  Patient will contact CSW with any support or resource needs    Macario CHRISTELLA Au, LCSW Clinical Social Worker [facility]

## 2024-06-21 DIAGNOSIS — C189 Malignant neoplasm of colon, unspecified: Secondary | ICD-10-CM | POA: Diagnosis not present

## 2024-06-26 ENCOUNTER — Inpatient Hospital Stay

## 2024-06-26 ENCOUNTER — Encounter: Payer: Self-pay | Admitting: Oncology

## 2024-06-26 ENCOUNTER — Inpatient Hospital Stay (HOSPITAL_BASED_OUTPATIENT_CLINIC_OR_DEPARTMENT_OTHER): Admitting: Oncology

## 2024-06-26 VITALS — BP 138/86 | HR 66 | Temp 98.6°F | Resp 19 | Wt 156.7 lb

## 2024-06-26 DIAGNOSIS — Z5112 Encounter for antineoplastic immunotherapy: Secondary | ICD-10-CM | POA: Diagnosis not present

## 2024-06-26 DIAGNOSIS — C787 Secondary malignant neoplasm of liver and intrahepatic bile duct: Secondary | ICD-10-CM

## 2024-06-26 DIAGNOSIS — Z5189 Encounter for other specified aftercare: Secondary | ICD-10-CM | POA: Diagnosis not present

## 2024-06-26 DIAGNOSIS — Z5111 Encounter for antineoplastic chemotherapy: Secondary | ICD-10-CM

## 2024-06-26 DIAGNOSIS — Z79899 Other long term (current) drug therapy: Secondary | ICD-10-CM | POA: Diagnosis not present

## 2024-06-26 DIAGNOSIS — C189 Malignant neoplasm of colon, unspecified: Secondary | ICD-10-CM

## 2024-06-26 DIAGNOSIS — C187 Malignant neoplasm of sigmoid colon: Secondary | ICD-10-CM | POA: Diagnosis not present

## 2024-06-26 LAB — CBC WITH DIFFERENTIAL (CANCER CENTER ONLY)
Abs Immature Granulocytes: 1.04 K/uL — ABNORMAL HIGH (ref 0.00–0.07)
Basophils Absolute: 0.1 K/uL (ref 0.0–0.1)
Basophils Relative: 1 %
Eosinophils Absolute: 0.2 K/uL (ref 0.0–0.5)
Eosinophils Relative: 2 %
HCT: 37.7 % (ref 36.0–46.0)
Hemoglobin: 12.4 g/dL (ref 12.0–15.0)
Immature Granulocytes: 10 %
Lymphocytes Relative: 21 %
Lymphs Abs: 2.3 K/uL (ref 0.7–4.0)
MCH: 30.8 pg (ref 26.0–34.0)
MCHC: 32.9 g/dL (ref 30.0–36.0)
MCV: 93.8 fL (ref 80.0–100.0)
Monocytes Absolute: 0.6 K/uL (ref 0.1–1.0)
Monocytes Relative: 5 %
Neutro Abs: 6.7 K/uL (ref 1.7–7.7)
Neutrophils Relative %: 61 %
Platelet Count: 193 K/uL (ref 150–400)
RBC: 4.02 MIL/uL (ref 3.87–5.11)
RDW: 13.7 % (ref 11.5–15.5)
Smear Review: NORMAL
WBC Count: 10.9 K/uL — ABNORMAL HIGH (ref 4.0–10.5)
nRBC: 0.3 % — ABNORMAL HIGH (ref 0.0–0.2)

## 2024-06-26 LAB — CMP (CANCER CENTER ONLY)
ALT: 24 U/L (ref 0–44)
AST: 29 U/L (ref 15–41)
Albumin: 3.5 g/dL (ref 3.5–5.0)
Alkaline Phosphatase: 126 U/L (ref 38–126)
Anion gap: 8 (ref 5–15)
BUN: 8 mg/dL (ref 6–20)
CO2: 23 mmol/L (ref 22–32)
Calcium: 10.1 mg/dL (ref 8.9–10.3)
Chloride: 105 mmol/L (ref 98–111)
Creatinine: 0.61 mg/dL (ref 0.44–1.00)
GFR, Estimated: >60 mL/min (ref >60)
Glucose, Bld: 99 mg/dL (ref 70–99)
Potassium: 4.2 mmol/L (ref 3.5–5.1)
Sodium: 136 mmol/L (ref 135–145)
Total Bilirubin: 0.6 mg/dL (ref 0.0–1.2)
Total Protein: 6.5 g/dL (ref 6.5–8.1)

## 2024-06-26 LAB — TOTAL PROTEIN, URINE DIPSTICK: Protein, ur: NEGATIVE mg/dL

## 2024-06-26 LAB — MAGNESIUM: Magnesium: 2.1 mg/dL (ref 1.7–2.4)

## 2024-06-26 MED ORDER — SODIUM CHLORIDE 0.9 % IV SOLN
2400.0000 mg/m2 | INTRAVENOUS | Status: DC
  Administered 2024-06-26: 4300 mg via INTRAVENOUS
  Filled 2024-06-26: qty 86

## 2024-06-26 MED ORDER — PALONOSETRON HCL INJECTION 0.25 MG/5ML
0.2500 mg | Freq: Once | INTRAVENOUS | Status: AC
  Administered 2024-06-26: 0.25 mg via INTRAVENOUS
  Filled 2024-06-26: qty 5

## 2024-06-26 MED ORDER — SODIUM CHLORIDE 0.9 % IV SOLN
6.0000 mg/kg | Freq: Once | INTRAVENOUS | Status: AC
  Administered 2024-06-26: 400 mg via INTRAVENOUS
  Filled 2024-06-26: qty 20

## 2024-06-26 MED ORDER — DEXTROSE 5 % IV SOLN
INTRAVENOUS | Status: DC
  Filled 2024-06-26: qty 250

## 2024-06-26 MED ORDER — LEUCOVORIN CALCIUM INJECTION 350 MG
400.0000 mg/m2 | Freq: Once | INTRAVENOUS | Status: AC
  Administered 2024-06-26: 720 mg via INTRAVENOUS
  Filled 2024-06-26: qty 18.5

## 2024-06-26 MED ORDER — SODIUM CHLORIDE 0.9 % IV SOLN
INTRAVENOUS | Status: DC
  Filled 2024-06-26: qty 250

## 2024-06-26 MED ORDER — OXALIPLATIN CHEMO INJECTION 100 MG/20ML
85.0000 mg/m2 | Freq: Once | INTRAVENOUS | Status: AC
  Administered 2024-06-26: 150 mg via INTRAVENOUS
  Filled 2024-06-26: qty 2.48

## 2024-06-26 MED ORDER — FLUOROURACIL CHEMO INJECTION 2.5 GM/50ML
400.0000 mg/m2 | Freq: Once | INTRAVENOUS | Status: AC
  Administered 2024-06-26: 700 mg via INTRAVENOUS
  Filled 2024-06-26: qty 14

## 2024-06-26 MED ORDER — DEXAMETHASONE SODIUM PHOSPHATE 10 MG/ML IJ SOLN
10.0000 mg | Freq: Once | INTRAMUSCULAR | Status: AC
  Administered 2024-06-26: 10 mg via INTRAVENOUS
  Filled 2024-06-26: qty 1

## 2024-06-26 NOTE — Progress Notes (Signed)
 Hematology/Oncology Consult note [facility]  [phone] [phone]  Patient Care Team: Marylynn Verneita CROME, MD as PCP - General (Internal Medicine) Maurie Rayfield BIRCH, RN as Oncology Nurse Navigator Melanee Annah BROCKS, MD as Consulting Physician (Oncology)   Name of the patient: Carla Mckinney  [id number]  [birth year]   Date of visit: 06/26/24  Diagnosis- stage IV adenocarcinoma of the colon with liver metastases   Chief complaint/ Reason for visit-on treatment assessment prior to cycle 3 of palliative FOLFOX panitumumab  chemotherapy  Heme/Onc history: patient is a 63 year old female with a past medical history significant for hypercalcemia due to hyperparathyroidism, hyperlipidemia among other medical problems. She was noted to have bright red blood in her stools along with symptoms of unintentional weight loss which has been going on for the last 6 weeks.  She has lost about 20 pounds of weight.  Her last colonoscopy in 2018 was unremarkable.  Patient was seen by [facility] and colonoscopy is planned a month from now.  Patient underwent CT abdomen and pelvis with contrast on 05/03/2024 for symptoms of right upper quadrant abdominal pain which showed large enhancing masses in the liver measuring up to 12.5 cm in the right hepatic lobe and 8.5 cm in the posterior right hepatic dome.  No evidence of intra-abdominal adenopathy.  This was followed by MRI abdomen with and without contrast which was ordered by me on 05/04/2024 which showed extremely bulky heterogeneously hypoenhancing masses occupying the majority of the right lobe of the liver measuring 14.9 x 12.9 x 9.1 cm.  Occasional incidental benign cyst throughout the liver.  Pancreas, spleen and adrenal glands and kidney appeared normal.  Stomach and intra-abdominal lymph nodes appeared normal.  Rectosigmoid mass was noted along with mesenteric adenopathy concerning for primary origin of these metastases.   aFP was normal.  CEA was elevated at 305.  CA 19-9 elevated at 176.MMR stable.  Plan is to proceed with FOLFOX Avastin  chemotherapy for first-line    NGS testing showed T p53, APC, BRAF, APC, PHLPP 1, PPP2R2A SMAD4 mutations.  K-ras and NRAS negative.  MSI stable.  MMR normal.  Tumor mutational burden low.      Interval history-patient has been reporting intermittent episodes of dizziness over the last 3 days.  During 1 such episode patient woke up in the middle of the night and felt that the room was spinning.  Symptoms are better this morning occasional constipation.  Denies any nausea or vomiting.  She has no longer had fevers.  She has mild acneform rash on her face which is currently well-controlled with topical clindamycin .  ECOG PS- 1 Pain scale- 0 Opioid associated constipation- no  Review of systems- Review of Systems  Constitutional:  Negative for chills, fever, malaise/fatigue and weight loss.  HENT:  Negative for congestion, ear discharge and nosebleeds.   Eyes:  Negative for blurred vision.  Respiratory:  Negative for cough, hemoptysis, sputum production, shortness of breath and wheezing.   Cardiovascular:  Negative for chest pain, palpitations, orthopnea and claudication.  Gastrointestinal:  Negative for abdominal pain, blood in stool, constipation, diarrhea, heartburn, melena, nausea and vomiting.  Genitourinary:  Negative for dysuria, flank pain, frequency, hematuria and urgency.  Musculoskeletal:  Negative for back pain, joint pain and myalgias.  Skin:  Negative for rash.  Neurological:  Positive for dizziness. Negative for tingling, focal weakness, seizures, weakness and headaches.  Endo/Heme/Allergies:  Does not bruise/bleed easily.  Psychiatric/Behavioral:  Negative for depression and suicidal ideas.  The patient does not have insomnia.       Allergies  Allergen Reactions   Lisinopril  Swelling     Past Medical History:  Diagnosis Date   Hypertension     Rheumatic fever 1986   has had echo since then. no cardiac issues.     Past Surgical History:  Procedure Laterality Date   ABDOMINAL HYSTERECTOMY     AUGMENTATION MAMMAPLASTY Bilateral    breast lift   CARDIAC CATHETERIZATION  2002   normal, due to syncope and abnormal myowiew ([place])   COLONOSCOPY WITH PROPOFOL  N/A 09/15/2017   Procedure: COLONOSCOPY WITH PROPOFOL ;  Surgeon: Unk Corinn Skiff, MD;  Location: [facility];  Service: Gastroenterology;  Laterality: N/A;   IR IMAGING GUIDED PORT INSERTION  05/11/2024   MASTOPEXY  2008   OOPHORECTOMY     secondary to scar tissue,  [facility]   RIGHT OOPHORECTOMY     TONSILLECTOMY     TUBAL LIGATION      Social History   Socioeconomic History   Marital status: Married    Spouse name: Not on file   Number of children: 2   Years of education: Not on file   Highest education level: Associate degree: academic program  Occupational History   Not on file  Tobacco Use   Smoking status: Never   Smokeless tobacco: Never  Vaping Use   Vaping status: Never Used  Substance and Sexual Activity   Alcohol use: Yes    Alcohol/week: 1.0 standard drink of alcohol    Types: 1 Glasses of wine per week    Comment: socially   Drug use: No   Sexual activity: Yes    Birth control/protection: None  Other Topics Concern   Not on file  Social History Narrative   Not on file   Social Drivers of Health   Financial Resource Strain: Low Risk  (11/16/2023)   Overall Financial Resource Strain (CARDIA)    Difficulty of Paying Living Expenses: Not hard at all  Food Insecurity: No Food Insecurity (05/05/2024)   Hunger Vital Sign    Worried About Running Out of Food in the Last Year: Never true    Ran Out of Food in the Last Year: Never true  Transportation Needs: No Transportation Needs (05/05/2024)   PRAPARE - Administrator, Civil Service (Medical): No    Lack of Transportation (Non-Medical): No  Physical Activity: Insufficiently  Active (05/05/2024)   Exercise Vital Sign    Days of Exercise per Week: 2 days    Minutes of Exercise per Session: 20 min  Stress: No Stress Concern Present (11/16/2023)   [employer] of Occupational Health - Occupational Stress Questionnaire    Feeling of Stress : Not at all  Social Connections: Moderately Integrated (11/16/2023)   Social Connection and Isolation Panel    Frequency of Communication with Friends and Family: More than three times a week    Frequency of Social Gatherings with Friends and Family: Twice a week    Attends Religious Services: More than 4 times per year    Active Member of [employer] or Organizations: No    Attends Banker Meetings: Not on file    Marital Status: Married  Catering manager Violence: Not At Risk (05/05/2024)   Humiliation, Afraid, Rape, and Kick questionnaire    Fear of Current or Ex-Partner: No    Emotionally Abused: No    Physically Abused: No    Sexually Abused: No  Family History  Problem Relation Age of Onset   Heart disease Mother    Heart failure Mother    Hypertension Father    Hyperlipidemia Father    Heart disease Maternal Grandfather    Heart disease Paternal Grandmother    Stroke Paternal Grandmother 44   Cancer Paternal Grandfather        multiple myeloma   Breast cancer Neg Hx      Current Outpatient Medications:    acetaminophen (TYLENOL) 325 MG tablet, Take 650 mg by mouth every 6 (six) hours as needed., Disp: , Rfl:    amLODipine  (NORVASC ) 10 MG tablet, Take 1 tablet (10 mg total) by mouth daily., Disp: 90 tablet, Rfl: 1   cholecalciferol (VITAMIN D3) 25 MCG (1000 UNIT) tablet, Take 1,000 Units by mouth daily., Disp: , Rfl:    clindamycin  (CLEOCIN  T) 1 % lotion, Apply topically 2 (two) times daily., Disp: 60 mL, Rfl: 0   dexamethasone  (DECADRON ) 4 MG tablet, Take 2 tablets (8 mg total) by mouth daily. Start the day after chemotherapy for 2 days. Take with food., Disp: 30 tablet, Rfl: 1   diltiazem   (CARDIZEM ) 30 MG tablet, Take 1 tablet (30 mg total) by mouth as needed (rapid heart rate)., Disp: 90 tablet, Rfl: 2   escitalopram  (LEXAPRO ) 10 MG tablet, Take 1 tablet (10 mg total) by mouth daily., Disp: 30 tablet, Rfl: 0   lactulose  (CHRONULAC ) 10 GM/15ML solution, Take 15-30 mLs (10-20 g total) by mouth 2 (two) times daily as needed for mild constipation., Disp: 236 mL, Rfl: 0   levofloxacin  (LEVAQUIN ) 750 MG tablet, Take 1 tablet (750 mg total) by mouth daily., Disp: 7 tablet, Rfl: 0   lidocaine -prilocaine  (EMLA ) cream, Apply to affected area once, Disp: 30 g, Rfl: 3   metoprolol  succinate (TOPROL -XL) 100 MG 24 hr tablet, Take 1 tablet (100 mg total) by mouth daily., Disp: 90 tablet, Rfl: 1   ondansetron  (ZOFRAN ) 8 MG tablet, Take 1 tablet (8 mg total) by mouth every 8 (eight) hours as needed for nausea or vomiting. Start on the third day after chemotherapy., Disp: 30 tablet, Rfl: 1   oxyCODONE  (OXY IR/ROXICODONE ) 5 MG immediate release tablet, Take 1 tablet (5 mg total) by mouth every 4 (four) hours as needed for severe pain (pain score 7-10)., Disp: 120 tablet, Rfl: 0   pantoprazole  (PROTONIX ) 20 MG tablet, Take 1 tablet (20 mg total) by mouth daily., Disp: 30 tablet, Rfl: 3   prochlorperazine  (COMPAZINE ) 10 MG tablet, Take 1 tablet (10 mg total) by mouth every 6 (six) hours as needed for nausea or vomiting., Disp: 30 tablet, Rfl: 1  Physical exam:  Vitals:   06/26/24 0850  BP: 138/86  Pulse: 66  Resp: 19  Temp: 98.6 F (37 C)  SpO2: 99%  Weight: 156 lb 11.2 oz (71.1 kg)   Physical Exam Cardiovascular:     Rate and Rhythm: Normal rate and regular rhythm.     Heart sounds: Normal heart sounds.  Pulmonary:     Effort: Pulmonary effort is normal.     Breath sounds: Normal breath sounds.  Skin:    General: Skin is warm and dry.  Neurological:     Mental Status: She is alert and oriented to person, place, and time.      I have personally reviewed labs listed below:    Latest  Ref Rng & Units 06/26/2024    8:09 AM  CMP  Glucose 70 - 99 mg/dL 99  BUN 6 - 20 mg/dL 8   Creatinine 9.55 - 8.99 mg/dL 9.38   Sodium 864 - 854 mmol/L 136   Potassium 3.5 - 5.1 mmol/L 4.2   Chloride 98 - 111 mmol/L 105   CO2 22 - 32 mmol/L 23   Calcium  8.9 - 10.3 mg/dL 89.8   Total Protein 6.5 - 8.1 g/dL 6.5   Total Bilirubin 0.0 - 1.2 mg/dL 0.6   Alkaline Phos 38 - 126 U/L 126   AST 15 - 41 U/L 29   ALT 0 - 44 U/L 24       Latest Ref Rng & Units 06/26/2024    8:09 AM  CBC  WBC 4.0 - 10.5 K/uL 10.9   Hemoglobin 12.0 - 15.0 g/dL 87.5   Hematocrit 63.9 - 46.0 % 37.7   Platelets 150 - 400 K/uL 193    I have personally reviewed Radiology images listed below: No images are attached to the encounter.  DG Chest 2 View Result Date: 05/29/2024 CLINICAL DATA:  Fever for 2 days, history of lung carcinoma EXAM: CHEST - 2 VIEW COMPARISON:  PET-CT from 05/16/2024 FINDINGS: Cardiac shadows within normal limits. Right chest wall port is noted. The lungs are clear bilaterally. No focal infiltrate or effusion is seen. No bony abnormality is noted. IMPRESSION: No acute abnormality noted. Electronically Signed   By: Oneil Devonshire M.D.   On: 05/29/2024 04:03     Assessment and plan- Patient is a 63 y.o. female with history of metastatic adenocarcinoma of the colon with liver metastases here for on treatment assessment prior to cycle 3 of palliative FOLFOX panitumumab  chemotherapy  Counts okay to proceed with cycle 3 of palliative FOLFOX panitumumab  chemotherapy today.  White count is 10.9 and holding off on giving her Udenyca  with this cycle.  Depending on her counts she will probably need it for next cycle.  She will be seen by covering provider in 2 weeks for cycle 4 and I will see her back in 4 weeks for cycle 5.  Plan to repeat scans after 5-6 cycles.  CEA remains elevated in the 300s.  Patient's symptoms seem to be more consistent with vertigo rather than lightheadedness from hypotension.  She will  let me know how her symptoms go and if they persist I will consider giving her anti-vert. Panitumumab  is not known to cause symptoms of vertigo.  5-FU can cause symptoms of ataxia and cerebellar syndrome but not overt vertigo.  Since patient has no longer had any fevers she can cancel her appointment with infectious disease at this point.  When we started her treatment she was having intermittent fevers which may have been related to tumor related fever which have now resolved.   Visit Diagnosis 1. Encounter for antineoplastic chemotherapy   2. Encounter for monoclonal antibody treatment for malignancy   3. Colon cancer metastasized to liver [facility])      Dr. Annah Skene, MD, MPH [facility] [phone] 06/26/2024 8:52 AM

## 2024-06-26 NOTE — Progress Notes (Signed)
 Patient states she has been experiencing dizziness over the past 4 days.

## 2024-06-26 NOTE — Patient Instructions (Signed)
 [facility] - A DEPT OF [facility]  Discharge Instructions: Thank you for choosing [facility] to provide your oncology and hematology care.  If you have a lab appointment with the Cancer Center, please go directly to the Cancer Center and check in at the registration area.  Wear comfortable clothing and clothing appropriate for easy access to any Portacath or PICC line.   We strive to give you quality time with your provider. You may need to reschedule your appointment if you arrive late (15 or more minutes).  Arriving late affects you and other patients whose appointments are after yours.  Also, if you miss three or more appointments without notifying the office, you may be dismissed from the clinic at the provider's discretion.      For prescription refill requests, have your pharmacy contact our office and allow 72 hours for refills to be completed.    Today you received the following chemotherapy and/or immunotherapy agents Vectibix , Oxaliplatin , Leucovoroin & Adrucil       To help prevent nausea and vomiting after your treatment, we encourage you to take your nausea medication as directed.  BELOW ARE SYMPTOMS THAT SHOULD BE REPORTED IMMEDIATELY: *FEVER GREATER THAN 100.4 F (38 C) OR HIGHER *CHILLS OR SWEATING *NAUSEA AND VOMITING THAT IS NOT CONTROLLED WITH YOUR NAUSEA MEDICATION *UNUSUAL SHORTNESS OF BREATH *UNUSUAL BRUISING OR BLEEDING *URINARY PROBLEMS (pain or burning when urinating, or frequent urination) *BOWEL PROBLEMS (unusual diarrhea, constipation, pain near the anus) TENDERNESS IN MOUTH AND THROAT WITH OR WITHOUT PRESENCE OF ULCERS (sore throat, sores in mouth, or a toothache) UNUSUAL RASH, SWELLING OR PAIN  UNUSUAL VAGINAL DISCHARGE OR ITCHING   Items with * indicate a potential emergency and should be followed up as soon as possible or go to the Emergency Department if any problems should occur.  Please show the  CHEMOTHERAPY ALERT CARD or IMMUNOTHERAPY ALERT CARD at check-in to the Emergency Department and triage nurse.  Should you have questions after your visit or need to cancel or reschedule your appointment, please contact [facility] - A DEPT OF [facility]  [phone] and follow the prompts.  Office hours are 8:00 a.m. to 4:30 p.m. Monday - Friday. Please note that voicemails left after 4:00 p.m. may not be returned until the following business day.  We are closed weekends and major holidays. You have access to a nurse at all times for urgent questions. Please call the main number to the clinic [phone] and follow the prompts.  For any non-urgent questions, you may also contact your provider using MyChart. We now offer e-Visits for anyone 7 and older to request care online for non-urgent symptoms. For details visit [email or web address].   Also download the MyChart app! Go to the app store, search MyChart, open the app, select [place], and log in with your MyChart username and password.

## 2024-06-27 ENCOUNTER — Other Ambulatory Visit: Payer: Self-pay

## 2024-06-27 LAB — CEA: CEA: 106 ng/mL — ABNORMAL HIGH (ref 0.0–4.7)

## 2024-06-28 ENCOUNTER — Inpatient Hospital Stay

## 2024-06-28 VITALS — BP 145/94 | HR 70 | Temp 98.0°F | Resp 18

## 2024-06-28 DIAGNOSIS — C787 Secondary malignant neoplasm of liver and intrahepatic bile duct: Secondary | ICD-10-CM | POA: Diagnosis not present

## 2024-06-28 DIAGNOSIS — C189 Malignant neoplasm of colon, unspecified: Secondary | ICD-10-CM

## 2024-06-28 DIAGNOSIS — Z79899 Other long term (current) drug therapy: Secondary | ICD-10-CM | POA: Diagnosis not present

## 2024-06-28 DIAGNOSIS — Z5189 Encounter for other specified aftercare: Secondary | ICD-10-CM | POA: Diagnosis not present

## 2024-06-28 DIAGNOSIS — Z5111 Encounter for antineoplastic chemotherapy: Secondary | ICD-10-CM | POA: Diagnosis not present

## 2024-06-28 DIAGNOSIS — Z5112 Encounter for antineoplastic immunotherapy: Secondary | ICD-10-CM | POA: Diagnosis not present

## 2024-06-28 DIAGNOSIS — C187 Malignant neoplasm of sigmoid colon: Secondary | ICD-10-CM | POA: Diagnosis not present

## 2024-06-28 MED ORDER — HEPARIN SOD (PORK) LOCK FLUSH 100 UNIT/ML IV SOLN
500.0000 [IU] | Freq: Once | INTRAVENOUS | Status: AC | PRN
Start: 2024-06-28 — End: 2024-06-28
  Administered 2024-06-28: 500 [IU]
  Filled 2024-06-28: qty 5

## 2024-06-28 MED ORDER — SODIUM CHLORIDE 0.9% FLUSH
10.0000 mL | INTRAVENOUS | Status: DC | PRN
  Administered 2024-06-28: 10 mL
  Filled 2024-06-28: qty 10

## 2024-07-04 ENCOUNTER — Encounter: Payer: Self-pay | Admitting: Oncology

## 2024-07-04 ENCOUNTER — Other Ambulatory Visit: Payer: Self-pay

## 2024-07-04 ENCOUNTER — Telehealth: Payer: Self-pay | Admitting: *Deleted

## 2024-07-04 MED ORDER — STERILE WATER FOR INJECTION IJ SOLN
5.0000 mL | Freq: Four times a day (QID) | OROMUCOSAL | 3 refills | Status: DC | PRN
  Filled 2024-07-04 (×2): qty 280, 14d supply, fill #0

## 2024-07-04 NOTE — Telephone Encounter (Signed)
 Patient called and wanted to see what you could do for painful ribs swelling and lesions in the mouth.  She had sent a MyChart message but she felt like it probably was not going to be looked at because of Dr. Melanee being on vacation.  The nurse did see the message and she put in for Magic mouthwash and was told that she could go over to the [facility] and they should be able to make it today.  I called the patient back she said that etc. probably about 20 more minutes and then they would have mixed up.  I told her it is best to get a straw to use because he probably can hardly get anything down and liquids is best for her.  She understands that she is going to go pick it up

## 2024-07-06 ENCOUNTER — Ambulatory Visit: Admitting: Infectious Diseases

## 2024-07-10 ENCOUNTER — Inpatient Hospital Stay

## 2024-07-10 ENCOUNTER — Encounter: Payer: Self-pay | Admitting: Oncology

## 2024-07-10 ENCOUNTER — Encounter: Payer: Self-pay | Admitting: Nurse Practitioner

## 2024-07-10 ENCOUNTER — Inpatient Hospital Stay (HOSPITAL_BASED_OUTPATIENT_CLINIC_OR_DEPARTMENT_OTHER): Admitting: Nurse Practitioner

## 2024-07-10 VITALS — BP 118/80 | HR 71 | Temp 98.3°F | Resp 18 | Ht 63.0 in | Wt 157.0 lb

## 2024-07-10 VITALS — BP 147/81 | HR 69 | Temp 97.0°F | Resp 19

## 2024-07-10 DIAGNOSIS — C189 Malignant neoplasm of colon, unspecified: Secondary | ICD-10-CM | POA: Diagnosis not present

## 2024-07-10 DIAGNOSIS — K5903 Drug induced constipation: Secondary | ICD-10-CM | POA: Diagnosis not present

## 2024-07-10 DIAGNOSIS — C187 Malignant neoplasm of sigmoid colon: Secondary | ICD-10-CM | POA: Diagnosis not present

## 2024-07-10 DIAGNOSIS — Z5112 Encounter for antineoplastic immunotherapy: Secondary | ICD-10-CM

## 2024-07-10 DIAGNOSIS — Z79899 Other long term (current) drug therapy: Secondary | ICD-10-CM | POA: Diagnosis not present

## 2024-07-10 DIAGNOSIS — Z5111 Encounter for antineoplastic chemotherapy: Secondary | ICD-10-CM

## 2024-07-10 DIAGNOSIS — Z5189 Encounter for other specified aftercare: Secondary | ICD-10-CM | POA: Diagnosis not present

## 2024-07-10 DIAGNOSIS — C787 Secondary malignant neoplasm of liver and intrahepatic bile duct: Secondary | ICD-10-CM | POA: Diagnosis not present

## 2024-07-10 LAB — CBC WITH DIFFERENTIAL (CANCER CENTER ONLY)
Abs Immature Granulocytes: 0.01 K/uL (ref 0.00–0.07)
Basophils Absolute: 0.1 K/uL (ref 0.0–0.1)
Basophils Relative: 1 %
Eosinophils Absolute: 0.1 K/uL (ref 0.0–0.5)
Eosinophils Relative: 2 %
HCT: 34.7 % — ABNORMAL LOW (ref 36.0–46.0)
Hemoglobin: 11.5 g/dL — ABNORMAL LOW (ref 12.0–15.0)
Immature Granulocytes: 0 %
Lymphocytes Relative: 29 %
Lymphs Abs: 1.5 K/uL (ref 0.7–4.0)
MCH: 31.2 pg (ref 26.0–34.0)
MCHC: 33.1 g/dL (ref 30.0–36.0)
MCV: 94 fL (ref 80.0–100.0)
Monocytes Absolute: 0.8 K/uL (ref 0.1–1.0)
Monocytes Relative: 16 %
Neutro Abs: 2.6 K/uL (ref 1.7–7.7)
Neutrophils Relative %: 52 %
Platelet Count: 259 K/uL (ref 150–400)
RBC: 3.69 MIL/uL — ABNORMAL LOW (ref 3.87–5.11)
RDW: 14.8 % (ref 11.5–15.5)
WBC Count: 5.1 K/uL (ref 4.0–10.5)
nRBC: 0 % (ref 0.0–0.2)

## 2024-07-10 LAB — CMP (CANCER CENTER ONLY)
ALT: 33 U/L (ref 0–44)
AST: 38 U/L (ref 15–41)
Albumin: 3.7 g/dL (ref 3.5–5.0)
Alkaline Phosphatase: 110 U/L (ref 38–126)
Anion gap: 6 (ref 5–15)
BUN: 9 mg/dL (ref 6–20)
CO2: 23 mmol/L (ref 22–32)
Calcium: 10 mg/dL (ref 8.9–10.3)
Chloride: 104 mmol/L (ref 98–111)
Creatinine: 0.53 mg/dL (ref 0.44–1.00)
GFR, Estimated: >60 mL/min (ref >60)
Glucose, Bld: 105 mg/dL — ABNORMAL HIGH (ref 70–99)
Potassium: 3.9 mmol/L (ref 3.5–5.1)
Sodium: 133 mmol/L — ABNORMAL LOW (ref 135–145)
Total Bilirubin: 1.5 mg/dL — ABNORMAL HIGH (ref 0.0–1.2)
Total Protein: 6.5 g/dL (ref 6.5–8.1)

## 2024-07-10 LAB — MAGNESIUM: Magnesium: 2 mg/dL (ref 1.7–2.4)

## 2024-07-10 MED ORDER — LEUCOVORIN CALCIUM INJECTION 350 MG
400.0000 mg/m2 | Freq: Once | INTRAVENOUS | Status: AC
  Administered 2024-07-10: 720 mg via INTRAVENOUS
  Filled 2024-07-10: qty 25
  Filled 2024-07-10: qty 36

## 2024-07-10 MED ORDER — SODIUM CHLORIDE 0.9 % IV SOLN
2400.0000 mg/m2 | INTRAVENOUS | Status: DC
  Administered 2024-07-10: 4300 mg via INTRAVENOUS
  Filled 2024-07-10: qty 86

## 2024-07-10 MED ORDER — DEXTROSE 5 % IV SOLN
INTRAVENOUS | Status: DC
  Filled 2024-07-10: qty 250

## 2024-07-10 MED ORDER — DEXAMETHASONE SODIUM PHOSPHATE 10 MG/ML IJ SOLN
10.0000 mg | Freq: Once | INTRAMUSCULAR | Status: DC
Start: 2024-07-10 — End: 2024-07-10

## 2024-07-10 MED ORDER — SODIUM CHLORIDE 0.9 % IV SOLN
6.0000 mg/kg | Freq: Once | INTRAVENOUS | Status: AC
  Administered 2024-07-10: 400 mg via INTRAVENOUS
  Filled 2024-07-10: qty 20

## 2024-07-10 MED ORDER — PALONOSETRON HCL INJECTION 0.25 MG/5ML
0.2500 mg | Freq: Once | INTRAVENOUS | Status: AC
Start: 2024-07-10 — End: 2024-07-10
  Administered 2024-07-10: 0.25 mg via INTRAVENOUS
  Filled 2024-07-10: qty 5

## 2024-07-10 MED ORDER — FLUOROURACIL CHEMO INJECTION 2.5 GM/50ML
400.0000 mg/m2 | Freq: Once | INTRAVENOUS | Status: AC
  Administered 2024-07-10: 700 mg via INTRAVENOUS
  Filled 2024-07-10: qty 14

## 2024-07-10 MED ORDER — DEXAMETHASONE SODIUM PHOSPHATE 10 MG/ML IJ SOLN
5.0000 mg | Freq: Once | INTRAMUSCULAR | Status: AC
  Administered 2024-07-10: 5 mg via INTRAVENOUS
  Filled 2024-07-10: qty 1

## 2024-07-10 MED ORDER — OXALIPLATIN CHEMO INJECTION 100 MG/20ML
85.0000 mg/m2 | Freq: Once | INTRAVENOUS | Status: AC
  Administered 2024-07-10: 150 mg via INTRAVENOUS
  Filled 2024-07-10: qty 30

## 2024-07-10 NOTE — Progress Notes (Unsigned)
 Hematology/Oncology Consult Note [facility]  Telephone:([phone] [phone]  Patient Care Team: Marylynn Verneita CROME, MD as PCP - General (Internal Medicine) Maurie Rayfield BIRCH, RN as Oncology Nurse Navigator Melanee Annah BROCKS, MD as Consulting Physician (Oncology)   Name of the patient: Carla Mckinney  [id number]  [birth year]   Date of visit: 07/10/24  Diagnosis- stage IV adenocarcinoma of the colon with liver metastases   Chief complaint/ Reason for visit- on treatment assessment prior to cycle 3 of palliative FOLFOX panitumumab  chemotherapy  Heme/Onc History: patient is a 63 year old female with a past medical history significant for hypercalcemia due to hyperparathyroidism, hyperlipidemia among other medical problems. She was noted to have bright red blood in her stools along with symptoms of unintentional weight loss which has been going on for the last 6 weeks.  She has lost about 20 pounds of weight.  Her last colonoscopy in 2018 was unremarkable.  Patient was seen by [facility] and colonoscopy is planned a month from now.  Patient underwent CT abdomen and pelvis with contrast on 05/03/2024 for symptoms of right upper quadrant abdominal pain which showed large enhancing masses in the liver measuring up to 12.5 cm in the right hepatic lobe and 8.5 cm in the posterior right hepatic dome.  No evidence of intra-abdominal adenopathy.  This was followed by MRI abdomen with and without contrast which was ordered by me on 05/04/2024 which showed extremely bulky heterogeneously hypoenhancing masses occupying the majority of the right lobe of the liver measuring 14.9 x 12.9 x 9.1 cm.  Occasional incidental benign cyst throughout the liver.  Pancreas, spleen and adrenal glands and kidney appeared normal.  Stomach and intra-abdominal lymph nodes appeared normal.  Rectosigmoid mass was noted along with mesenteric adenopathy concerning for primary origin of these metastases.  aFP  was normal.  CEA was elevated at 305.  CA 19-9 elevated at 176.MMR stable.  Plan is to proceed with FOLFOX Avastin  chemotherapy for first-line    NGS testing showed T p53, APC, BRAF, APC, PHLPP 1, PPP2R2A SMAD4 mutations.  K-ras and NRAS negative.  MSI stable.  MMR normal.  Tumor mutational burden low.    Interval History- Carla Mckinney is a 63 y.o. female with above history of colon cancer who returnst o clinic for continuation of chemotherapy. Complains of dry skin particularly around eyes. Using magic mouthwash for sore throat which helped. Complains of constipation. Worse.  ECOG PS- 1 Pain scale- 0 Opioid associated constipation- no  Review of systems- Review of Systems  Constitutional:  Positive for malaise/fatigue. Negative for chills, fever and weight loss.  HENT:  Negative for congestion, ear discharge and nosebleeds.   Eyes:  Negative for blurred vision.  Respiratory:  Negative for cough, hemoptysis, sputum production, shortness of breath and wheezing.   Cardiovascular:  Negative for chest pain, palpitations, orthopnea and claudication.  Gastrointestinal:  Positive for constipation. Negative for abdominal pain, blood in stool, diarrhea, heartburn, melena, nausea and vomiting.  Genitourinary:  Negative for dysuria, flank pain, frequency, hematuria and urgency.  Musculoskeletal:  Negative for back pain, joint pain and myalgias.  Skin:  Negative for rash.  Neurological:  Positive for dizziness. Negative for tingling, focal weakness, seizures, weakness and headaches.  Endo/Heme/Allergies:  Does not bruise/bleed easily.  Psychiatric/Behavioral:  Negative for depression and suicidal ideas. The patient does not have insomnia.     Allergies  Allergen Reactions   Lisinopril  Swelling   Past Medical History:  Diagnosis Date  Hypertension    Rheumatic fever 1986   has had echo since then. no cardiac issues.   Past Surgical History:  Procedure Laterality Date   ABDOMINAL  HYSTERECTOMY     AUGMENTATION MAMMAPLASTY Bilateral    breast lift   CARDIAC CATHETERIZATION  2002   normal, due to syncope and abnormal myowiew ([place])   COLONOSCOPY WITH PROPOFOL  N/A 09/15/2017   Procedure: COLONOSCOPY WITH PROPOFOL ;  Surgeon: Unk Corinn Skiff, MD;  Location: [facility];  Service: Gastroenterology;  Laterality: N/A;   IR IMAGING GUIDED PORT INSERTION  05/11/2024   MASTOPEXY  2008   OOPHORECTOMY     secondary to scar tissue,  [facility]   RIGHT OOPHORECTOMY     TONSILLECTOMY     TUBAL LIGATION     Social History   Socioeconomic History   Marital status: Married    Spouse name: Not on file   Number of children: 2   Years of education: Not on file   Highest education level: Associate degree: academic program  Occupational History   Not on file  Tobacco Use   Smoking status: Never   Smokeless tobacco: Never  Vaping Use   Vaping status: Never Used  Substance and Sexual Activity   Alcohol use: Yes    Alcohol/week: 1.0 standard drink of alcohol    Types: 1 Glasses of wine per week    Comment: socially   Drug use: No   Sexual activity: Yes    Birth control/protection: None  Other Topics Concern   Not on file  Social History Narrative   Not on file   Social Drivers of Health   Financial Resource Strain: Low Risk  (11/16/2023)   Overall Financial Resource Strain (CARDIA)    Difficulty of Paying Living Expenses: Not hard at all  Food Insecurity: No Food Insecurity (05/05/2024)   Hunger Vital Sign    Worried About Running Out of Food in the Last Year: Never true    Ran Out of Food in the Last Year: Never true  Transportation Needs: No Transportation Needs (05/05/2024)   PRAPARE - Administrator, Civil Service (Medical): No    Lack of Transportation (Non-Medical): No  Physical Activity: Insufficiently Active (05/05/2024)   Exercise Vital Sign    Days of Exercise per Week: 2 days    Minutes of Exercise per Session: 20 min  Stress: No Stress  Concern Present (11/16/2023)   [employer] of Occupational Health - Occupational Stress Questionnaire    Feeling of Stress : Not at all  Social Connections: Moderately Integrated (11/16/2023)   Social Connection and Isolation Panel    Frequency of Communication with Friends and Family: More than three times a week    Frequency of Social Gatherings with Friends and Family: Twice a week    Attends Religious Services: More than 4 times per year    Active Member of [employer] or Organizations: No    Attends Engineer, structural: Not on file    Marital Status: Married  Catering manager Violence: Not At Risk (05/05/2024)   Humiliation, Afraid, Rape, and Kick questionnaire    Fear of Current or Ex-Partner: No    Emotionally Abused: No    Physically Abused: No    Sexually Abused: No   Family History  Problem Relation Age of Onset   Heart disease Mother    Heart failure Mother    Hypertension Father    Hyperlipidemia Father    Heart disease Maternal Grandfather  Heart disease Paternal Grandmother    Stroke Paternal Grandmother 27   Cancer Paternal Grandfather        multiple myeloma   Breast cancer Neg Hx     Current Outpatient Medications:    acetaminophen (TYLENOL) 325 MG tablet, Take 650 mg by mouth every 6 (six) hours as needed., Disp: , Rfl:    amLODipine  (NORVASC ) 10 MG tablet, Take 1 tablet (10 mg total) by mouth daily., Disp: 90 tablet, Rfl: 1   cholecalciferol (VITAMIN D3) 25 MCG (1000 UNIT) tablet, Take 1,000 Units by mouth daily., Disp: , Rfl:    clindamycin  (CLEOCIN  T) 1 % lotion, Apply topically 2 (two) times daily., Disp: 60 mL, Rfl: 0   dexamethasone  (DECADRON ) 4 MG tablet, Take 2 tablets (8 mg total) by mouth daily. Start the day after chemotherapy for 2 days. Take with food., Disp: 30 tablet, Rfl: 1   diltiazem  (CARDIZEM ) 30 MG tablet, Take 1 tablet (30 mg total) by mouth as needed (rapid heart rate)., Disp: 90 tablet, Rfl: 2   escitalopram  (LEXAPRO ) 10 MG  tablet, Take 1 tablet (10 mg total) by mouth daily., Disp: 30 tablet, Rfl: 0   lactulose  (CHRONULAC ) 10 GM/15ML solution, Take 15-30 mLs (10-20 g total) by mouth 2 (two) times daily as needed for mild constipation., Disp: 236 mL, Rfl: 0   levofloxacin  (LEVAQUIN ) 750 MG tablet, Take 1 tablet (750 mg total) by mouth daily., Disp: 7 tablet, Rfl: 0   lidocaine -prilocaine  (EMLA ) cream, Apply to affected area once, Disp: 30 g, Rfl: 3   magic mouthwash (multi-ingredient) oral suspension, Take 5 mLs by mouth 4 (four) times daily as needed for mouth pain., Disp: 280 mL, Rfl: 3   metoprolol  succinate (TOPROL -XL) 100 MG 24 hr tablet, Take 1 tablet (100 mg total) by mouth daily., Disp: 90 tablet, Rfl: 1   ondansetron  (ZOFRAN ) 8 MG tablet, Take 1 tablet (8 mg total) by mouth every 8 (eight) hours as needed for nausea or vomiting. Start on the third day after chemotherapy., Disp: 30 tablet, Rfl: 1   oxyCODONE  (OXY IR/ROXICODONE ) 5 MG immediate release tablet, Take 1 tablet (5 mg total) by mouth every 4 (four) hours as needed for severe pain (pain score 7-10)., Disp: 120 tablet, Rfl: 0   pantoprazole  (PROTONIX ) 20 MG tablet, Take 1 tablet (20 mg total) by mouth daily., Disp: 30 tablet, Rfl: 3   prochlorperazine  (COMPAZINE ) 10 MG tablet, Take 1 tablet (10 mg total) by mouth every 6 (six) hours as needed for nausea or vomiting., Disp: 30 tablet, Rfl: 1  Physical exam:  Vitals:   07/10/24 0944  BP: 118/80  Pulse: 71  Resp: 18  Temp: 98.3 F (36.8 C)  TempSrc: Tympanic  SpO2: 99%  Weight: 157 lb (71.2 kg)  Height: 5' 3 (1.6 m)   Physical Exam Constitutional:      Appearance: She is not ill-appearing.  Cardiovascular:     Rate and Rhythm: Normal rate and regular rhythm.  Pulmonary:     Effort: Pulmonary effort is normal.     Breath sounds: Normal breath sounds.  Skin:    General: Skin is warm and dry.  Neurological:     Mental Status: She is alert and oriented to person, place, and time.   Psychiatric:        Mood and Affect: Mood normal.        Behavior: Behavior normal.     I have personally reviewed labs listed below:    Latest Ref Rng &  Units 07/10/2024    9:24 AM  CMP  Glucose 70 - 99 mg/dL 894   BUN 6 - 20 mg/dL 9   Creatinine 9.55 - 8.99 mg/dL 9.46   Sodium 864 - 854 mmol/L 133   Potassium 3.5 - 5.1 mmol/L 3.9   Chloride 98 - 111 mmol/L 104   CO2 22 - 32 mmol/L 23   Calcium  8.9 - 10.3 mg/dL 89.9   Total Protein 6.5 - 8.1 g/dL 6.5   Total Bilirubin 0.0 - 1.2 mg/dL 1.5   Alkaline Phos 38 - 126 U/L 110   AST 15 - 41 U/L 38   ALT 0 - 44 U/L 33       Latest Ref Rng & Units 07/10/2024    9:24 AM  CBC  WBC 4.0 - 10.5 K/uL 5.1   Hemoglobin 12.0 - 15.0 g/dL 88.4   Hematocrit 63.9 - 46.0 % 34.7   Platelets 150 - 400 K/uL 259    No results found.   Assessment and plan- Patient is a 63 y.o. female who returns to clinic for   History of metastatic adenocarcinoma of the colon with liver metastases - here for on treatment assessment prior to cycle 4 of palliative FOLFOX panitumumab  chemotherapy. CEA has dropped from 327 to 106 with last cycle. Plan to repeat scans after 5-6 cycles. Labs reviewed- proceed with cycle 4 today.  GCSF- wbc 5.1. GCSF held with last cycle. Proceed with udenyca  today.  Fevers- resolved. Cancelled ID appt. Likely related to tumor/malignancy.  Dry Skin - question if related to pre-meds. Plan to reduce dex to 5 mg IV. Proceed with aloxi . Recommended topicals such as cerave for hydration.  Constipation- question if related to pre-meds. Plan to reduce dex to 5 mg IV. Proceed with aloxi . Discussed stool management including magnesium citrate for acute constipation then miralax 1-3 times a day with senna 2 tablets 1-2 times a day.  Dizziness- ? Vertigo. If persistent, plan for antivert.   Disposition:  Tx today- decrease dex to 5 mg Give udenyca  F/u per IS- la   Visit Diagnosis 1. Encounter for antineoplastic chemotherapy   2. Encounter  for monoclonal antibody treatment for malignancy   3. Drug-induced constipation    Tinnie Dawn, DNP, AGNP-C, Physician [facility] [phone] (clinic) 07/10/2024

## 2024-07-10 NOTE — Progress Notes (Unsigned)
 Patient is having some constipation, which she is taking some Mirilax, and if that doesn't help then she does have something stronger she can use. She is also having some severe dryness around her eyes near eye lids, skin is flaking, she uses lotion but it usually burns when she uses it.  Her appetite is back. She doesn't really have any questions for today.

## 2024-07-11 ENCOUNTER — Encounter: Payer: Self-pay | Admitting: Oncology

## 2024-07-12 ENCOUNTER — Inpatient Hospital Stay

## 2024-07-12 VITALS — BP 144/74 | HR 75 | Temp 96.0°F | Resp 18

## 2024-07-12 DIAGNOSIS — Z79899 Other long term (current) drug therapy: Secondary | ICD-10-CM | POA: Diagnosis not present

## 2024-07-12 DIAGNOSIS — C787 Secondary malignant neoplasm of liver and intrahepatic bile duct: Secondary | ICD-10-CM

## 2024-07-12 DIAGNOSIS — Z5189 Encounter for other specified aftercare: Secondary | ICD-10-CM | POA: Diagnosis not present

## 2024-07-12 DIAGNOSIS — Z5111 Encounter for antineoplastic chemotherapy: Secondary | ICD-10-CM | POA: Diagnosis not present

## 2024-07-12 DIAGNOSIS — Z5112 Encounter for antineoplastic immunotherapy: Secondary | ICD-10-CM | POA: Diagnosis not present

## 2024-07-12 DIAGNOSIS — C187 Malignant neoplasm of sigmoid colon: Secondary | ICD-10-CM | POA: Diagnosis not present

## 2024-07-12 MED ORDER — HEPARIN SOD (PORK) LOCK FLUSH 100 UNIT/ML IV SOLN
500.0000 [IU] | Freq: Once | INTRAVENOUS | Status: AC | PRN
Start: 2024-07-12 — End: 2024-07-12
  Administered 2024-07-12: 500 [IU]
  Filled 2024-07-12: qty 5

## 2024-07-12 MED ORDER — PEGFILGRASTIM-JMDB 6 MG/0.6ML ~~LOC~~ SOSY
6.0000 mg | PREFILLED_SYRINGE | Freq: Once | SUBCUTANEOUS | Status: AC
  Administered 2024-07-12: 6 mg via SUBCUTANEOUS
  Filled 2024-07-12: qty 0.6

## 2024-07-12 MED ORDER — SODIUM CHLORIDE 0.9% FLUSH
10.0000 mL | INTRAVENOUS | Status: DC | PRN
  Administered 2024-07-12: 10 mL
  Filled 2024-07-12: qty 10

## 2024-07-22 DIAGNOSIS — C189 Malignant neoplasm of colon, unspecified: Secondary | ICD-10-CM | POA: Diagnosis not present

## 2024-07-24 ENCOUNTER — Inpatient Hospital Stay (HOSPITAL_BASED_OUTPATIENT_CLINIC_OR_DEPARTMENT_OTHER): Attending: Oncology | Admitting: Oncology

## 2024-07-24 ENCOUNTER — Inpatient Hospital Stay

## 2024-07-24 ENCOUNTER — Encounter: Payer: Self-pay | Admitting: Oncology

## 2024-07-24 ENCOUNTER — Inpatient Hospital Stay: Attending: Oncology

## 2024-07-24 VITALS — BP 132/87 | HR 74 | Temp 97.4°F | Resp 19 | Ht 63.0 in | Wt 156.4 lb

## 2024-07-24 VITALS — BP 129/72 | HR 67

## 2024-07-24 DIAGNOSIS — Z7952 Long term (current) use of systemic steroids: Secondary | ICD-10-CM | POA: Insufficient documentation

## 2024-07-24 DIAGNOSIS — C787 Secondary malignant neoplasm of liver and intrahepatic bile duct: Secondary | ICD-10-CM

## 2024-07-24 DIAGNOSIS — C187 Malignant neoplasm of sigmoid colon: Secondary | ICD-10-CM | POA: Insufficient documentation

## 2024-07-24 DIAGNOSIS — Z79899 Other long term (current) drug therapy: Secondary | ICD-10-CM | POA: Diagnosis not present

## 2024-07-24 DIAGNOSIS — C189 Malignant neoplasm of colon, unspecified: Secondary | ICD-10-CM

## 2024-07-24 DIAGNOSIS — Z5112 Encounter for antineoplastic immunotherapy: Secondary | ICD-10-CM | POA: Diagnosis present

## 2024-07-24 DIAGNOSIS — Z5111 Encounter for antineoplastic chemotherapy: Secondary | ICD-10-CM | POA: Diagnosis present

## 2024-07-24 LAB — CMP (CANCER CENTER ONLY)
ALT: 42 U/L (ref 0–44)
AST: 43 U/L — ABNORMAL HIGH (ref 15–41)
Albumin: 3.8 g/dL (ref 3.5–5.0)
Alkaline Phosphatase: 156 U/L — ABNORMAL HIGH (ref 38–126)
Anion gap: 8 (ref 5–15)
BUN: 10 mg/dL (ref 6–20)
CO2: 24 mmol/L (ref 22–32)
Calcium: 10.8 mg/dL — ABNORMAL HIGH (ref 8.9–10.3)
Chloride: 102 mmol/L (ref 98–111)
Creatinine: 0.68 mg/dL (ref 0.44–1.00)
GFR, Estimated: >60 mL/min (ref >60)
Glucose, Bld: 111 mg/dL — ABNORMAL HIGH (ref 70–99)
Potassium: 4 mmol/L (ref 3.5–5.1)
Sodium: 134 mmol/L — ABNORMAL LOW (ref 135–145)
Total Bilirubin: 0.9 mg/dL (ref 0.0–1.2)
Total Protein: 6.9 g/dL (ref 6.5–8.1)

## 2024-07-24 LAB — CBC WITH DIFFERENTIAL (CANCER CENTER ONLY)
Abs Immature Granulocytes: 0.44 K/uL — ABNORMAL HIGH (ref 0.00–0.07)
Basophils Absolute: 0.1 K/uL (ref 0.0–0.1)
Basophils Relative: 1 %
Eosinophils Absolute: 0.2 K/uL (ref 0.0–0.5)
Eosinophils Relative: 2 %
HCT: 40 % (ref 36.0–46.0)
Hemoglobin: 13.2 g/dL (ref 12.0–15.0)
Immature Granulocytes: 4 %
Lymphocytes Relative: 21 %
Lymphs Abs: 2.5 K/uL (ref 0.7–4.0)
MCH: 31.3 pg (ref 26.0–34.0)
MCHC: 33 g/dL (ref 30.0–36.0)
MCV: 94.8 fL (ref 80.0–100.0)
Monocytes Absolute: 0.9 K/uL (ref 0.1–1.0)
Monocytes Relative: 7 %
Neutro Abs: 7.7 K/uL (ref 1.7–7.7)
Neutrophils Relative %: 65 %
Platelet Count: 184 K/uL (ref 150–400)
RBC: 4.22 MIL/uL (ref 3.87–5.11)
RDW: 16.4 % — ABNORMAL HIGH (ref 11.5–15.5)
WBC Count: 11.8 K/uL — ABNORMAL HIGH (ref 4.0–10.5)
nRBC: 0 % (ref 0.0–0.2)

## 2024-07-24 LAB — MAGNESIUM: Magnesium: 2 mg/dL (ref 1.7–2.4)

## 2024-07-24 MED ORDER — SODIUM CHLORIDE 0.9 % IV SOLN
6.0000 mg/kg | Freq: Once | INTRAVENOUS | Status: AC
  Administered 2024-07-24: 400 mg via INTRAVENOUS
  Filled 2024-07-24: qty 20

## 2024-07-24 MED ORDER — FLUOROURACIL CHEMO INJECTION 2.5 GM/50ML
400.0000 mg/m2 | Freq: Once | INTRAVENOUS | Status: AC
  Administered 2024-07-24: 700 mg via INTRAVENOUS
  Filled 2024-07-24: qty 14

## 2024-07-24 MED ORDER — LEUCOVORIN CALCIUM INJECTION 350 MG
400.0000 mg/m2 | Freq: Once | INTRAVENOUS | Status: AC
  Administered 2024-07-24: 720 mg via INTRAVENOUS
  Filled 2024-07-24: qty 25

## 2024-07-24 MED ORDER — OXALIPLATIN CHEMO INJECTION 100 MG/20ML
85.0000 mg/m2 | Freq: Once | INTRAVENOUS | Status: AC
  Administered 2024-07-24: 150 mg via INTRAVENOUS
  Filled 2024-07-24: qty 21.42

## 2024-07-24 MED ORDER — PALONOSETRON HCL INJECTION 0.25 MG/5ML
0.2500 mg | Freq: Once | INTRAVENOUS | Status: AC
  Administered 2024-07-24: 0.25 mg via INTRAVENOUS
  Filled 2024-07-24: qty 5

## 2024-07-24 MED ORDER — SODIUM CHLORIDE 0.9 % IV SOLN
2400.0000 mg/m2 | INTRAVENOUS | Status: DC
  Administered 2024-07-24: 4300 mg via INTRAVENOUS
  Filled 2024-07-24: qty 86

## 2024-07-24 MED ORDER — DEXTROSE 5 % IV SOLN
INTRAVENOUS | Status: DC
Start: 2024-07-24 — End: 2024-07-24
  Filled 2024-07-24: qty 250

## 2024-07-24 MED ORDER — DEXAMETHASONE SODIUM PHOSPHATE 10 MG/ML IJ SOLN
10.0000 mg | Freq: Once | INTRAMUSCULAR | Status: AC
  Administered 2024-07-24: 10 mg via INTRAVENOUS
  Filled 2024-07-24: qty 1

## 2024-07-24 MED ORDER — SODIUM CHLORIDE 0.9% FLUSH
10.0000 mL | INTRAVENOUS | Status: DC | PRN
  Filled 2024-07-24: qty 10

## 2024-07-24 NOTE — Progress Notes (Signed)
 Hematology/Oncology Consult note [facility]  Telephone:([phone] [phone]  Patient Care Team: Marylynn Verneita CROME, MD as PCP - General (Internal Medicine) Maurie Rayfield BIRCH, RN as Oncology Nurse Navigator Melanee Annah BROCKS, MD as Consulting Physician (Oncology)   Name of the patient: Carla Mckinney  [id number]  10-Apr-1961   Date of visit: 07/24/24  Diagnosis-  stage IV adenocarcinoma of the colon with liver metastases   Chief complaint/ Reason for visit-on treatment assessment prior to cycle 5 of palliative FOLFOX panitumumab  chemotherapy  Heme/Onc history:  patient is a 63 year old female with a past medical history significant for hypercalcemia due to hyperparathyroidism, hyperlipidemia among other medical problems. She was noted to have bright red blood in her stools along with symptoms of unintentional weight loss which has been going on for the last 6 weeks.  She has lost about 20 pounds of weight.  Her last colonoscopy in 2018 was unremarkable.  Patient was seen by [facility] and colonoscopy is planned a month from now.  Patient underwent CT abdomen and pelvis with contrast on 05/03/2024 for symptoms of right upper quadrant abdominal pain which showed large enhancing masses in the liver measuring up to 12.5 cm in the right hepatic lobe and 8.5 cm in the posterior right hepatic dome.  No evidence of intra-abdominal adenopathy.  This was followed by MRI abdomen with and without contrast which was ordered by me on 05/04/2024 which showed extremely bulky heterogeneously hypoenhancing masses occupying the majority of the right lobe of the liver measuring 14.9 x 12.9 x 9.1 cm.  Occasional incidental benign cyst throughout the liver.  Pancreas, spleen and adrenal glands and kidney appeared normal.  Stomach and intra-abdominal lymph nodes appeared normal.  Rectosigmoid mass was noted along with mesenteric adenopathy concerning for primary origin of these metastases.   aFP was normal.  CEA was elevated at 305.  CA 19-9 elevated at 176.MMR stable.  Plan is to proceed with FOLFOX Avastin  chemotherapy for first-line    NGS testing showed T p53, APC, BRAF, APC, PHLPP 1, PPP2R2A SMAD4 mutations.  K-ras and NRAS negative.  MSI stable.  MMR normal.  Tumor mutational burden low.      Interval history-tolerating chemotherapy including panitumumab  well so far.  She does report dry skin over her face but no overt acneform rash.  She has been using emollient creams for dry skin which do cause some initial burning when she applies on her face.  She has been having occasional right upper quadrant pain over the last couple of days.  Denies any nausea vomiting or diarrhea.  No blood in stools.  ECOG PS- 0 Pain scale- 2 Opioid associated constipation- no  Review of systems- Review of Systems  Constitutional:  Negative for chills, fever, malaise/fatigue and weight loss.  HENT:  Negative for congestion, ear discharge and nosebleeds.   Eyes:  Negative for blurred vision.  Respiratory:  Negative for cough, hemoptysis, sputum production, shortness of breath and wheezing.   Cardiovascular:  Negative for chest pain, palpitations, orthopnea and claudication.  Gastrointestinal:  Negative for abdominal pain, blood in stool, constipation, diarrhea, heartburn, melena, nausea and vomiting.  Genitourinary:  Negative for dysuria, flank pain, frequency, hematuria and urgency.  Musculoskeletal:  Negative for back pain, joint pain and myalgias.  Skin:  Negative for rash.       Dry skin  Neurological:  Negative for dizziness, tingling, focal weakness, seizures, weakness and headaches.  Endo/Heme/Allergies:  Does not bruise/bleed easily.  Psychiatric/Behavioral:  Negative for depression and suicidal ideas. The patient does not have insomnia.       Allergies  Allergen Reactions   Lisinopril  Swelling     Past Medical History:  Diagnosis Date   Hypertension    Rheumatic fever 1986    has had echo since then. no cardiac issues.     Past Surgical History:  Procedure Laterality Date   ABDOMINAL HYSTERECTOMY     AUGMENTATION MAMMAPLASTY Bilateral    breast lift   CARDIAC CATHETERIZATION  2002   normal, due to syncope and abnormal myowiew ([place])   COLONOSCOPY WITH PROPOFOL  N/A 09/15/2017   Procedure: COLONOSCOPY WITH PROPOFOL ;  Surgeon: Unk Corinn Skiff, MD;  Location: [facility];  Service: Gastroenterology;  Laterality: N/A;   IR IMAGING GUIDED PORT INSERTION  05/11/2024   MASTOPEXY  2008   OOPHORECTOMY     secondary to scar tissue,  [facility]   RIGHT OOPHORECTOMY     TONSILLECTOMY     TUBAL LIGATION      Social History   Socioeconomic History   Marital status: Married    Spouse name: Not on file   Number of children: 2   Years of education: Not on file   Highest education level: Associate degree: academic program  Occupational History   Not on file  Tobacco Use   Smoking status: Never   Smokeless tobacco: Never  Vaping Use   Vaping status: Never Used  Substance and Sexual Activity   Alcohol use: Yes    Alcohol/week: 1.0 standard drink of alcohol    Types: 1 Glasses of wine per week    Comment: socially   Drug use: No   Sexual activity: Yes    Birth control/protection: None  Other Topics Concern   Not on file  Social History Narrative   Not on file   Social Drivers of Health   Financial Resource Strain: Low Risk  (11/16/2023)   Overall Financial Resource Strain (CARDIA)    Difficulty of Paying Living Expenses: Not hard at all  Food Insecurity: No Food Insecurity (05/05/2024)   Hunger Vital Sign    Worried About Running Out of Food in the Last Year: Never true    Ran Out of Food in the Last Year: Never true  Transportation Needs: No Transportation Needs (05/05/2024)   PRAPARE - Administrator, Civil Service (Medical): No    Lack of Transportation (Non-Medical): No  Physical Activity: Insufficiently Active (05/05/2024)    Exercise Vital Sign    Days of Exercise per Week: 2 days    Minutes of Exercise per Session: 20 min  Stress: No Stress Concern Present (11/16/2023)   [employer] of Occupational Health - Occupational Stress Questionnaire    Feeling of Stress : Not at all  Social Connections: Moderately Integrated (11/16/2023)   Social Connection and Isolation Panel    Frequency of Communication with Friends and Family: More than three times a week    Frequency of Social Gatherings with Friends and Family: Twice a week    Attends Religious Services: More than 4 times per year    Active Member of [employer] or Organizations: No    Attends Banker Meetings: Not on file    Marital Status: Married  Intimate Partner Violence: Not At Risk (05/05/2024)   Humiliation, Afraid, Rape, and Kick questionnaire    Fear of Current or Ex-Partner: No    Emotionally Abused: No    Physically Abused: No  Sexually Abused: No    Family History  Problem Relation Age of Onset   Heart disease Mother    Heart failure Mother    Hypertension Father    Hyperlipidemia Father    Heart disease Maternal Grandfather    Heart disease Paternal Grandmother    Stroke Paternal Grandmother 24   Cancer Paternal Grandfather        multiple myeloma   Breast cancer Neg Hx      Current Outpatient Medications:    acetaminophen (TYLENOL) 325 MG tablet, Take 650 mg by mouth every 6 (six) hours as needed., Disp: , Rfl:    amLODipine  (NORVASC ) 10 MG tablet, Take 1 tablet (10 mg total) by mouth daily., Disp: 90 tablet, Rfl: 1   clindamycin  (CLEOCIN  T) 1 % lotion, Apply topically 2 (two) times daily., Disp: 60 mL, Rfl: 0   dexamethasone  (DECADRON ) 4 MG tablet, Take 2 tablets (8 mg total) by mouth daily. Start the day after chemotherapy for 2 days. Take with food., Disp: 30 tablet, Rfl: 1   diltiazem  (CARDIZEM ) 30 MG tablet, Take 1 tablet (30 mg total) by mouth as needed (rapid heart rate)., Disp: 90 tablet, Rfl: 2   escitalopram   (LEXAPRO ) 10 MG tablet, Take 1 tablet (10 mg total) by mouth daily., Disp: 30 tablet, Rfl: 0   lactulose  (CHRONULAC ) 10 GM/15ML solution, Take 15-30 mLs (10-20 g total) by mouth 2 (two) times daily as needed for mild constipation., Disp: 236 mL, Rfl: 0   lidocaine -prilocaine  (EMLA ) cream, Apply to affected area once, Disp: 30 g, Rfl: 3   magic mouthwash (multi-ingredient) oral suspension, Take 5 mLs by mouth 4 (four) times daily as needed for mouth pain., Disp: 280 mL, Rfl: 3   metoprolol  succinate (TOPROL -XL) 100 MG 24 hr tablet, Take 1 tablet (100 mg total) by mouth daily., Disp: 90 tablet, Rfl: 1   ondansetron  (ZOFRAN ) 8 MG tablet, Take 1 tablet (8 mg total) by mouth every 8 (eight) hours as needed for nausea or vomiting. Start on the third day after chemotherapy., Disp: 30 tablet, Rfl: 1   oxyCODONE  (OXY IR/ROXICODONE ) 5 MG immediate release tablet, Take 1 tablet (5 mg total) by mouth every 4 (four) hours as needed for severe pain (pain score 7-10)., Disp: 120 tablet, Rfl: 0   pantoprazole  (PROTONIX ) 20 MG tablet, Take 1 tablet (20 mg total) by mouth daily., Disp: 30 tablet, Rfl: 3   prochlorperazine  (COMPAZINE ) 10 MG tablet, Take 1 tablet (10 mg total) by mouth every 6 (six) hours as needed for nausea or vomiting., Disp: 30 tablet, Rfl: 1   cholecalciferol (VITAMIN D3) 25 MCG (1000 UNIT) tablet, Take 1,000 Units by mouth daily. (Patient not taking: Reported on 07/24/2024), Disp: , Rfl:    levofloxacin  (LEVAQUIN ) 750 MG tablet, Take 1 tablet (750 mg total) by mouth daily. (Patient not taking: Reported on 07/24/2024), Disp: 7 tablet, Rfl: 0 No current facility-administered medications for this visit.  Facility-Administered Medications Ordered in Other Visits:    dextrose  5 % solution, , Intravenous, Continuous, Melanee Annah BROCKS, MD, Last Rate: 10 mL/hr at 07/24/24 1107, New Bag at 07/24/24 1107   fluorouracil  (ADRUCIL ) 4,300 mg in sodium chloride  0.9 % 64 mL chemo infusion, 2,400 mg/m2 (Treatment Plan  Recorded), Intravenous, 1 day or 1 dose, Melanee Annah BROCKS, MD, 4,300 mg at 07/24/24 1551   sodium chloride  flush (NS) 0.9 % injection 10 mL, 10 mL, Intracatheter, PRN, Melanee Annah BROCKS, MD  Physical exam:  Vitals:   07/24/24 1035  BP: 132/87  Pulse: 74  Resp: 19  Temp: (!) 97.4 F (36.3 C)  TempSrc: Tympanic  SpO2: 96%  Weight: 156 lb 6.4 oz (70.9 kg)  Height: 5' 3 (1.6 m)   Physical Exam Cardiovascular:     Rate and Rhythm: Normal rate and regular rhythm.     Heart sounds: Normal heart sounds.  Pulmonary:     Effort: Pulmonary effort is normal.     Breath sounds: Normal breath sounds.  Skin:    General: Skin is warm and dry.     Comments: Skin over the face appears dry with no overt skin rash  Neurological:     Mental Status: She is alert and oriented to person, place, and time.      I have personally reviewed labs listed below:    Latest Ref Rng & Units 07/24/2024   10:20 AM  CMP  Glucose 70 - 99 mg/dL 888   BUN 6 - 20 mg/dL 10   Creatinine 9.55 - 1.00 mg/dL 9.31   Sodium 864 - 854 mmol/L 134   Potassium 3.5 - 5.1 mmol/L 4.0   Chloride 98 - 111 mmol/L 102   CO2 22 - 32 mmol/L 24   Calcium  8.9 - 10.3 mg/dL 89.1   Total Protein 6.5 - 8.1 g/dL 6.9   Total Bilirubin 0.0 - 1.2 mg/dL 0.9   Alkaline Phos 38 - 126 U/L 156   AST 15 - 41 U/L 43   ALT 0 - 44 U/L 42       Latest Ref Rng & Units 07/24/2024   10:20 AM  CBC  WBC 4.0 - 10.5 K/uL 11.8   Hemoglobin 12.0 - 15.0 g/dL 86.7   Hematocrit 63.9 - 46.0 % 40.0   Platelets 150 - 400 K/uL 184      Assessment and plan- Patient is a 64 y.o. female with history of metastatic adenocarcinoma of the colon with liver metastases here for on treatment assessment prior to cycle 5 of palliative FOLFOX panitumumab  chemotherapy  Counts okay to proceed with cycle 5 of palliative FOLFOX panitumumab  chemotherapy today.  I will see her back in 2 weeks for cycle 6.  Plan to repeat CT chest abdomen pelvis with contrast in 3 weeks.  Based  on response to treatment we will consider if there will be a role for primary tumor removal as well as liver directed therapy.  CEA from today is pending and overall CEA had come down to 106 as compared to 327 prior.  I am holding off on giving growth factor support with today's cycle given that her white count is 11.8.  Dryness involving facial skin likely secondary to panitumumab .  Continue with emollient creams.  She also has as needed clindamycin  if need be.   Visit Diagnosis 1. Colon cancer metastasized to liver (HCC)   2. Encounter for antineoplastic chemotherapy      Dr. Annah Skene, MD, MPH [facility] [phone] 07/24/2024 4:02 PM

## 2024-07-24 NOTE — Progress Notes (Signed)
 Patient says she's doing pretty good. She did have constipation with last treatment, but it's better now. She also has had a few nose bleeds and some sudden pain on right side that lasted about a week. Other than that, she's doing ok.

## 2024-07-25 ENCOUNTER — Other Ambulatory Visit: Payer: Self-pay

## 2024-07-25 LAB — CEA: CEA: 19.1 ng/mL — ABNORMAL HIGH (ref 0.0–4.7)

## 2024-07-26 ENCOUNTER — Inpatient Hospital Stay

## 2024-07-26 VITALS — BP 140/87 | HR 68 | Temp 97.6°F | Resp 18

## 2024-07-26 DIAGNOSIS — C189 Malignant neoplasm of colon, unspecified: Secondary | ICD-10-CM

## 2024-07-26 MED ORDER — SODIUM CHLORIDE 0.9% FLUSH
10.0000 mL | INTRAVENOUS | Status: AC | PRN
  Administered 2024-07-26: 10 mL
  Filled 2024-07-26: qty 10

## 2024-07-31 ENCOUNTER — Other Ambulatory Visit: Payer: Self-pay

## 2024-07-31 ENCOUNTER — Encounter: Payer: Self-pay | Admitting: Oncology

## 2024-07-31 DIAGNOSIS — C189 Malignant neoplasm of colon, unspecified: Secondary | ICD-10-CM

## 2024-07-31 DIAGNOSIS — C787 Secondary malignant neoplasm of liver and intrahepatic bile duct: Secondary | ICD-10-CM

## 2024-07-31 MED ORDER — HYDROCORTISONE 1 % EX LOTN
1.0000 | TOPICAL_LOTION | Freq: Two times a day (BID) | CUTANEOUS | 0 refills | Status: AC
  Filled 2024-07-31: qty 114, 11d supply, fill #0

## 2024-07-31 MED ORDER — DOXYCYCLINE HYCLATE 100 MG PO CAPS
100.0000 mg | ORAL_CAPSULE | Freq: Two times a day (BID) | ORAL | 0 refills | Status: DC
  Filled 2024-07-31: qty 56, 28d supply, fill #0

## 2024-07-31 MED ORDER — CLINDAMYCIN PHOSPHATE 1 % EX LOTN
TOPICAL_LOTION | Freq: Two times a day (BID) | CUTANEOUS | 0 refills | Status: AC
  Filled 2024-07-31 (×2): qty 60, 30d supply, fill #0

## 2024-07-31 NOTE — Telephone Encounter (Signed)
 Per Dr. Melanee Topical clindamycin  2% plus hydrocortisone  1% lotion BID plus doxycycline  100 mg BID for 4 weeks. Please send and call her.  Outbound call to patient, informed of above. Patient requested scripts to be sent to [facility]  Informed to contact center for any new and / or worsening symptoms.  Informed Dr. Melanee only clindamycin  1%, not 2%, is available to order.

## 2024-07-31 NOTE — Telephone Encounter (Signed)
 Topical clindamycin  2% plus hydrocortisone  1% lotion BID plus doxycycline  100 mg BID for 4 weeks. Please send and call her

## 2024-08-01 ENCOUNTER — Other Ambulatory Visit: Payer: Self-pay

## 2024-08-07 ENCOUNTER — Inpatient Hospital Stay

## 2024-08-07 ENCOUNTER — Inpatient Hospital Stay (HOSPITAL_BASED_OUTPATIENT_CLINIC_OR_DEPARTMENT_OTHER): Admitting: Oncology

## 2024-08-07 ENCOUNTER — Encounter: Payer: Self-pay | Admitting: Oncology

## 2024-08-07 VITALS — BP 132/86 | HR 75 | Temp 98.6°F | Resp 18 | Wt 160.0 lb

## 2024-08-07 DIAGNOSIS — C189 Malignant neoplasm of colon, unspecified: Secondary | ICD-10-CM | POA: Diagnosis not present

## 2024-08-07 DIAGNOSIS — Z5112 Encounter for antineoplastic immunotherapy: Secondary | ICD-10-CM | POA: Diagnosis not present

## 2024-08-07 DIAGNOSIS — C787 Secondary malignant neoplasm of liver and intrahepatic bile duct: Secondary | ICD-10-CM

## 2024-08-07 DIAGNOSIS — Z5111 Encounter for antineoplastic chemotherapy: Secondary | ICD-10-CM | POA: Diagnosis not present

## 2024-08-07 LAB — CMP (CANCER CENTER ONLY)
ALT: 41 U/L (ref 0–44)
AST: 41 U/L (ref 15–41)
Albumin: 3.3 g/dL — ABNORMAL LOW (ref 3.5–5.0)
Alkaline Phosphatase: 137 U/L — ABNORMAL HIGH (ref 38–126)
Anion gap: 8 (ref 5–15)
BUN: 11 mg/dL (ref 6–20)
CO2: 22 mmol/L (ref 22–32)
Calcium: 10.2 mg/dL (ref 8.9–10.3)
Chloride: 105 mmol/L (ref 98–111)
Creatinine: 0.56 mg/dL (ref 0.44–1.00)
GFR, Estimated: >60 mL/min (ref >60)
Glucose, Bld: 95 mg/dL (ref 70–99)
Potassium: 3.8 mmol/L (ref 3.5–5.1)
Sodium: 135 mmol/L (ref 135–145)
Total Bilirubin: 1 mg/dL (ref 0.0–1.2)
Total Protein: 6.4 g/dL — ABNORMAL LOW (ref 6.5–8.1)

## 2024-08-07 LAB — MAGNESIUM: Magnesium: 1.8 mg/dL (ref 1.7–2.4)

## 2024-08-07 LAB — CBC WITH DIFFERENTIAL (CANCER CENTER ONLY)
Abs Immature Granulocytes: 0.01 K/uL (ref 0.00–0.07)
Basophils Absolute: 0 K/uL (ref 0.0–0.1)
Basophils Relative: 1 %
Eosinophils Absolute: 0.2 K/uL (ref 0.0–0.5)
Eosinophils Relative: 5 %
HCT: 35.3 % — ABNORMAL LOW (ref 36.0–46.0)
Hemoglobin: 11.7 g/dL — ABNORMAL LOW (ref 12.0–15.0)
Immature Granulocytes: 0 %
Lymphocytes Relative: 32 %
Lymphs Abs: 1.1 K/uL (ref 0.7–4.0)
MCH: 32.1 pg (ref 26.0–34.0)
MCHC: 33.1 g/dL (ref 30.0–36.0)
MCV: 96.7 fL (ref 80.0–100.0)
Monocytes Absolute: 0.5 K/uL (ref 0.1–1.0)
Monocytes Relative: 14 %
Neutro Abs: 1.7 K/uL (ref 1.7–7.7)
Neutrophils Relative %: 48 %
Platelet Count: 159 K/uL (ref 150–400)
RBC: 3.65 MIL/uL — ABNORMAL LOW (ref 3.87–5.11)
RDW: 16.1 % — ABNORMAL HIGH (ref 11.5–15.5)
WBC Count: 3.5 K/uL — ABNORMAL LOW (ref 4.0–10.5)
nRBC: 0 % (ref 0.0–0.2)

## 2024-08-07 LAB — TOTAL PROTEIN, URINE DIPSTICK: Protein, ur: NEGATIVE mg/dL

## 2024-08-07 MED ORDER — FLUOROURACIL CHEMO INJECTION 2.5 GM/50ML
400.0000 mg/m2 | Freq: Once | INTRAVENOUS | Status: AC
  Administered 2024-08-07: 700 mg via INTRAVENOUS
  Filled 2024-08-07: qty 14

## 2024-08-07 MED ORDER — PALONOSETRON HCL INJECTION 0.25 MG/5ML
0.2500 mg | Freq: Once | INTRAVENOUS | Status: AC
  Administered 2024-08-07: 0.25 mg via INTRAVENOUS
  Filled 2024-08-07: qty 5

## 2024-08-07 MED ORDER — OXALIPLATIN CHEMO INJECTION 100 MG/20ML
85.0000 mg/m2 | Freq: Once | INTRAVENOUS | Status: AC
  Administered 2024-08-07: 150 mg via INTRAVENOUS
  Filled 2024-08-07: qty 30

## 2024-08-07 MED ORDER — SODIUM CHLORIDE 0.9 % IV SOLN
2400.0000 mg/m2 | INTRAVENOUS | Status: DC
  Administered 2024-08-07: 4300 mg via INTRAVENOUS
  Filled 2024-08-07: qty 86

## 2024-08-07 MED ORDER — SODIUM CHLORIDE 0.9 % IV SOLN
6.0000 mg/kg | Freq: Once | INTRAVENOUS | Status: AC
  Administered 2024-08-07: 400 mg via INTRAVENOUS
  Filled 2024-08-07: qty 20

## 2024-08-07 MED ORDER — DEXTROSE 5 % IV SOLN
INTRAVENOUS | Status: DC
  Filled 2024-08-07: qty 250

## 2024-08-07 MED ORDER — DEXAMETHASONE SODIUM PHOSPHATE 10 MG/ML IJ SOLN
10.0000 mg | Freq: Once | INTRAMUSCULAR | Status: AC
  Administered 2024-08-07: 10 mg via INTRAVENOUS
  Filled 2024-08-07: qty 1

## 2024-08-07 MED ORDER — SODIUM CHLORIDE 0.9 % IV SOLN
INTRAVENOUS | Status: DC
  Filled 2024-08-07: qty 250

## 2024-08-07 MED ORDER — LEUCOVORIN CALCIUM INJECTION 350 MG
400.0000 mg/m2 | Freq: Once | INTRAVENOUS | Status: AC
  Administered 2024-08-07: 720 mg via INTRAVENOUS
  Filled 2024-08-07: qty 21

## 2024-08-07 NOTE — Progress Notes (Signed)
 Hematology/Oncology Consult note [facility]  [phone] [phone]  Patient Care Team: Marylynn Verneita CROME, MD as PCP - General (Internal Medicine) Maurie Rayfield BIRCH, RN as Oncology Nurse Navigator Melanee Annah BROCKS, MD as Consulting Physician (Oncology)   Name of the patient: Carla Mckinney  [id number]  1961/07/07   Date of visit: 08/07/24  Diagnosis-  stage IV adenocarcinoma of the colon with liver metastases   Chief complaint/ Reason for visit-on treatment assessment prior to cycle 6 of palliative FOLFOX panitumumab  chemotherapy  Heme/Onc history:  patient is a 63 year old female with a past medical history significant for hypercalcemia due to hyperparathyroidism, hyperlipidemia among other medical problems. She was noted to have bright red blood in her stools along with symptoms of unintentional weight loss which has been going on for the last 6 weeks.  She has lost about 20 pounds of weight.  Her last colonoscopy in 2018 was unremarkable.  Patient was seen by [facility] and colonoscopy is planned a month from now.  Patient underwent CT abdomen and pelvis with contrast on 05/03/2024 for symptoms of right upper quadrant abdominal pain which showed large enhancing masses in the liver measuring up to 12.5 cm in the right hepatic lobe and 8.5 cm in the posterior right hepatic dome.  No evidence of intra-abdominal adenopathy.  This was followed by MRI abdomen with and without contrast which was ordered by me on 05/04/2024 which showed extremely bulky heterogeneously hypoenhancing masses occupying the majority of the right lobe of the liver measuring 14.9 x 12.9 x 9.1 cm.  Occasional incidental benign cyst throughout the liver.  Pancreas, spleen and adrenal glands and kidney appeared normal.  Stomach and intra-abdominal lymph nodes appeared normal.  Rectosigmoid mass was noted along with mesenteric adenopathy concerning for primary origin of these metastases.   aFP was normal.  CEA was elevated at 305.  CA 19-9 elevated at 176.MMR stable.  Patient was given 1 dose of FOLFOX Avastin  chemotherapy.  However after K-ras and NRAS wild-type testing and given the left-sided tumor she was switched to FOLFOX panitumumab     NGS testing showed T p53, APC, BRAF, APC, PHLPP 1, PPP2R2A SMAD4 mutations.  K-ras and NRAS negative.  MSI stable.  MMR normal.  Tumor mutational burden low.    Interval history-patient did have a significant rash involving her neck which has improved after starting topical hydrocortisone  along with topical clindamycin  and oral doxycycline  which she will continue.  Occasional right upper quadrant discomfort.  She is otherwise tolerating treatments well so far  ECOG PS- 1 Pain scale- 0   Review of systems- Review of Systems  Constitutional:  Negative for chills, fever, malaise/fatigue and weight loss.  HENT:  Negative for congestion, ear discharge and nosebleeds.   Eyes:  Negative for blurred vision.  Respiratory:  Negative for cough, hemoptysis, sputum production, shortness of breath and wheezing.   Cardiovascular:  Negative for chest pain, palpitations, orthopnea and claudication.  Gastrointestinal:  Negative for abdominal pain, blood in stool, constipation, diarrhea, heartburn, melena, nausea and vomiting.  Genitourinary:  Negative for dysuria, flank pain, frequency, hematuria and urgency.  Musculoskeletal:  Negative for back pain, joint pain and myalgias.  Skin:  Negative for rash.  Neurological:  Negative for dizziness, tingling, focal weakness, seizures, weakness and headaches.  Endo/Heme/Allergies:  Does not bruise/bleed easily.  Psychiatric/Behavioral:  Negative for depression and suicidal ideas. The patient does not have insomnia.       Allergies  Allergen Reactions  Lisinopril  Swelling     Past Medical History:  Diagnosis Date   Hypertension    Rheumatic fever 1986   has had echo since then. no cardiac issues.      Past Surgical History:  Procedure Laterality Date   ABDOMINAL HYSTERECTOMY     AUGMENTATION MAMMAPLASTY Bilateral    breast lift   CARDIAC CATHETERIZATION  2002   normal, due to syncope and abnormal myowiew ([place])   COLONOSCOPY WITH PROPOFOL  N/A 09/15/2017   Procedure: COLONOSCOPY WITH PROPOFOL ;  Surgeon: Unk Corinn Skiff, MD;  Location: [facility];  Service: Gastroenterology;  Laterality: N/A;   IR IMAGING GUIDED PORT INSERTION  05/11/2024   MASTOPEXY  2008   OOPHORECTOMY     secondary to scar tissue,  [facility]   RIGHT OOPHORECTOMY     TONSILLECTOMY     TUBAL LIGATION      Social History   Socioeconomic History   Marital status: Married    Spouse name: Not on file   Number of children: 2   Years of education: Not on file   Highest education level: Associate degree: academic program  Occupational History   Not on file  Tobacco Use   Smoking status: Never   Smokeless tobacco: Never  Vaping Use   Vaping status: Never Used  Substance and Sexual Activity   Alcohol use: Yes    Alcohol/week: 1.0 standard drink of alcohol    Types: 1 Glasses of wine per week    Comment: socially   Drug use: No   Sexual activity: Yes    Birth control/protection: None  Other Topics Concern   Not on file  Social History Narrative   Not on file   Social Drivers of Health   Financial Resource Strain: Low Risk  (11/16/2023)   Overall Financial Resource Strain (CARDIA)    Difficulty of Paying Living Expenses: Not hard at all  Food Insecurity: No Food Insecurity (05/05/2024)   Hunger Vital Sign    Worried About Running Out of Food in the Last Year: Never true    Ran Out of Food in the Last Year: Never true  Transportation Needs: No Transportation Needs (05/05/2024)   PRAPARE - Administrator, Civil Service (Medical): No    Lack of Transportation (Non-Medical): No  Physical Activity: Insufficiently Active (05/05/2024)   Exercise Vital Sign    Days of Exercise per  Week: 2 days    Minutes of Exercise per Session: 20 min  Stress: No Stress Concern Present (11/16/2023)   [employer] of Occupational Health - Occupational Stress Questionnaire    Feeling of Stress : Not at all  Social Connections: Moderately Integrated (11/16/2023)   Social Connection and Isolation Panel    Frequency of Communication with Friends and Family: More than three times a week    Frequency of Social Gatherings with Friends and Family: Twice a week    Attends Religious Services: More than 4 times per year    Active Member of [employer] or Organizations: No    Attends Banker Meetings: Not on file    Marital Status: Married  Catering manager Violence: Not At Risk (05/05/2024)   Humiliation, Afraid, Rape, and Kick questionnaire    Fear of Current or Ex-Partner: No    Emotionally Abused: No    Physically Abused: No    Sexually Abused: No    Family History  Problem Relation Age of Onset   Heart disease Mother    Heart failure  Mother    Hypertension Father    Hyperlipidemia Father    Heart disease Maternal Grandfather    Heart disease Paternal Grandmother    Stroke Paternal Grandmother 51   Cancer Paternal Grandfather        multiple myeloma   Breast cancer Neg Hx      Current Outpatient Medications:    acetaminophen (TYLENOL) 325 MG tablet, Take 650 mg by mouth every 6 (six) hours as needed., Disp: , Rfl:    amLODipine  (NORVASC ) 10 MG tablet, Take 1 tablet (10 mg total) by mouth daily., Disp: 90 tablet, Rfl: 1   clindamycin  (CLEOCIN  T) 1 % lotion, Apply topically 2 (two) times daily., Disp: 60 mL, Rfl: 0   dexamethasone  (DECADRON ) 4 MG tablet, Take 2 tablets (8 mg total) by mouth daily. Start the day after chemotherapy for 2 days. Take with food., Disp: 30 tablet, Rfl: 1   diltiazem  (CARDIZEM ) 30 MG tablet, Take 1 tablet (30 mg total) by mouth as needed (rapid heart rate)., Disp: 90 tablet, Rfl: 2   doxycycline  (VIBRAMYCIN ) 100 MG capsule, Take 1 capsule  (100 mg total) by mouth 2 (two) times daily. Take for 4 weeks, Disp: 56 capsule, Rfl: 0   escitalopram  (LEXAPRO ) 10 MG tablet, Take 1 tablet (10 mg total) by mouth daily., Disp: 30 tablet, Rfl: 0   hydrocortisone  1 % lotion, Apply 1 Application topically 2 (two) times daily., Disp: 114 g, Rfl: 0   lactulose  (CHRONULAC ) 10 GM/15ML solution, Take 15-30 mLs (10-20 g total) by mouth 2 (two) times daily as needed for mild constipation., Disp: 236 mL, Rfl: 0   lidocaine -prilocaine  (EMLA ) cream, Apply to affected area once, Disp: 30 g, Rfl: 3   magic mouthwash (multi-ingredient) oral suspension, Take 5 mLs by mouth 4 (four) times daily as needed for mouth pain., Disp: 280 mL, Rfl: 3   metoprolol  succinate (TOPROL -XL) 100 MG 24 hr tablet, Take 1 tablet (100 mg total) by mouth daily., Disp: 90 tablet, Rfl: 1   ondansetron  (ZOFRAN ) 8 MG tablet, Take 1 tablet (8 mg total) by mouth every 8 (eight) hours as needed for nausea or vomiting. Start on the third day after chemotherapy., Disp: 30 tablet, Rfl: 1   oxyCODONE  (OXY IR/ROXICODONE ) 5 MG immediate release tablet, Take 1 tablet (5 mg total) by mouth every 4 (four) hours as needed for severe pain (pain score 7-10)., Disp: 120 tablet, Rfl: 0   pantoprazole  (PROTONIX ) 20 MG tablet, Take 1 tablet (20 mg total) by mouth daily., Disp: 30 tablet, Rfl: 3   prochlorperazine  (COMPAZINE ) 10 MG tablet, Take 1 tablet (10 mg total) by mouth every 6 (six) hours as needed for nausea or vomiting., Disp: 30 tablet, Rfl: 1   cholecalciferol (VITAMIN D3) 25 MCG (1000 UNIT) tablet, Take 1,000 Units by mouth daily. (Patient not taking: Reported on 08/07/2024), Disp: , Rfl:    levofloxacin  (LEVAQUIN ) 750 MG tablet, Take 1 tablet (750 mg total) by mouth daily. (Patient not taking: Reported on 08/07/2024), Disp: 7 tablet, Rfl: 0 No current facility-administered medications for this visit.  Facility-Administered Medications Ordered in Other Visits:    0.9 %  sodium chloride  infusion, ,  Intravenous, Continuous, Melanee Annah BROCKS, MD, Stopped at 08/07/24 1210   dextrose  5 % solution, , Intravenous, Continuous, Melanee Annah BROCKS, MD, Stopped at 08/07/24 1433   fluorouracil  (ADRUCIL ) 4,300 mg in sodium chloride  0.9 % 64 mL chemo infusion, 2,400 mg/m2 (Treatment Plan Recorded), Intravenous, 1 day or 1 dose, Melanee Annah BROCKS,  MD, 4,300 mg at 08/07/24 1439   sodium chloride  flush (NS) 0.9 % injection 10 mL, 10 mL, Intracatheter, PRN, Melanee Annah BROCKS, MD, 10 mL at 07/26/24 1117  Physical exam:  Vitals:   08/07/24 1009  BP: 132/86  Pulse: 75  Resp: 18  Temp: 98.6 F (37 C)  SpO2: 97%  Weight: 160 lb (72.6 kg)   Physical Exam Cardiovascular:     Rate and Rhythm: Normal rate and regular rhythm.     Heart sounds: Normal heart sounds.  Pulmonary:     Effort: Pulmonary effort is normal.     Breath sounds: Normal breath sounds.  Skin:    General: Skin is warm and dry.     Comments: Barely noticeable erythematous skin rash involving the neck  Neurological:     Mental Status: She is alert and oriented to person, place, and time.      I have personally reviewed labs listed below:    Latest Ref Rng & Units 08/07/2024    9:35 AM  CMP  Glucose 70 - 99 mg/dL 95   BUN 6 - 20 mg/dL 11   Creatinine 9.55 - 1.00 mg/dL 9.43   Sodium 864 - 854 mmol/L 135   Potassium 3.5 - 5.1 mmol/L 3.8   Chloride 98 - 111 mmol/L 105   CO2 22 - 32 mmol/L 22   Calcium  8.9 - 10.3 mg/dL 89.7   Total Protein 6.5 - 8.1 g/dL 6.4   Total Bilirubin 0.0 - 1.2 mg/dL 1.0   Alkaline Phos 38 - 126 U/L 137   AST 15 - 41 U/L 41   ALT 0 - 44 U/L 41       Latest Ref Rng & Units 08/07/2024    9:35 AM  CBC  WBC 4.0 - 10.5 K/uL 3.5   Hemoglobin 12.0 - 15.0 g/dL 88.2   Hematocrit 63.9 - 46.0 % 35.3   Platelets 150 - 400 K/uL 159      Assessment and plan- Patient is a 63 y.o. female with history of metastatic adenocarcinoma of the colon with liver metastases.  She is here for on treatment assessment prior to cycle  6 of palliative FOLFOX panitumumab  chemotherapy  Counts okay to proceed with cycle 6 of FOLFOX panitumumab  chemotherapy today.  She will receive Neulasta  with today's cycle 1 day 3.  I will see her back in 2 weeks for cycle 7.  She is scheduled for scans next week.  Tumor markers are trending down.  If she has an excellent response to treatment we could consider resection of the primary sigmoid tumor as well as consideration for liver directed therapy including HAI pump therapy.  Skin rash secondary to panitumumab : Continue topical clindamycin  and hydrocortisone  as needed.  She will also stay on doxycycline  prophylaxis at this time   Visit Diagnosis 1. Colon cancer metastasized to liver (HCC)   2. Encounter for antineoplastic chemotherapy   3. Encounter for monoclonal antibody treatment for malignancy      Dr. Annah Melanee, MD, MPH [facility] at [facility] [phone] 08/07/2024 4:01 PM

## 2024-08-08 LAB — CEA: CEA: 11.4 ng/mL — ABNORMAL HIGH (ref 0.0–4.7)

## 2024-08-09 ENCOUNTER — Encounter: Payer: Self-pay | Admitting: Oncology

## 2024-08-09 ENCOUNTER — Inpatient Hospital Stay

## 2024-08-09 ENCOUNTER — Other Ambulatory Visit: Payer: Self-pay

## 2024-08-09 VITALS — BP 133/74 | HR 68 | Temp 97.6°F | Resp 16

## 2024-08-09 DIAGNOSIS — Z5112 Encounter for antineoplastic immunotherapy: Secondary | ICD-10-CM | POA: Diagnosis not present

## 2024-08-09 DIAGNOSIS — C189 Malignant neoplasm of colon, unspecified: Secondary | ICD-10-CM

## 2024-08-09 MED ORDER — PEGFILGRASTIM-JMDB 6 MG/0.6ML ~~LOC~~ SOSY
6.0000 mg | PREFILLED_SYRINGE | Freq: Once | SUBCUTANEOUS | Status: AC
  Administered 2024-08-09: 6 mg via SUBCUTANEOUS
  Filled 2024-08-09: qty 0.6

## 2024-08-14 ENCOUNTER — Ambulatory Visit
Admission: RE | Admit: 2024-08-14 | Discharge: 2024-08-14 | Disposition: A | Discharge status: Home / Self Care | Source: Ambulatory Visit | Attending: Oncology | Admitting: Oncology

## 2024-08-14 DIAGNOSIS — R160 Hepatomegaly, not elsewhere classified: Secondary | ICD-10-CM | POA: Diagnosis not present

## 2024-08-14 DIAGNOSIS — C787 Secondary malignant neoplasm of liver and intrahepatic bile duct: Secondary | ICD-10-CM | POA: Diagnosis not present

## 2024-08-14 DIAGNOSIS — I700 Atherosclerosis of aorta: Secondary | ICD-10-CM | POA: Diagnosis not present

## 2024-08-14 DIAGNOSIS — C189 Malignant neoplasm of colon, unspecified: Secondary | ICD-10-CM | POA: Diagnosis not present

## 2024-08-14 DIAGNOSIS — R918 Other nonspecific abnormal finding of lung field: Secondary | ICD-10-CM | POA: Diagnosis not present

## 2024-08-14 MED ORDER — IOHEXOL 350 MG/ML SOLN: 100.0000 mL | Freq: Once | INTRAVENOUS | Status: DC | PRN

## 2024-08-14 MED ORDER — IOHEXOL 300 MG/ML  SOLN
100.0000 mL | Freq: Once | INTRAMUSCULAR | Status: AC | PRN
  Administered 2024-08-14: 100 mL via INTRAVENOUS

## 2024-08-22 ENCOUNTER — Inpatient Hospital Stay

## 2024-08-22 ENCOUNTER — Inpatient Hospital Stay: Attending: Oncology

## 2024-08-22 ENCOUNTER — Encounter: Payer: Self-pay | Admitting: Oncology

## 2024-08-22 ENCOUNTER — Inpatient Hospital Stay (HOSPITAL_BASED_OUTPATIENT_CLINIC_OR_DEPARTMENT_OTHER): Admitting: Oncology

## 2024-08-22 VITALS — BP 126/72 | HR 77

## 2024-08-22 VITALS — BP 140/79 | HR 70 | Temp 97.7°F | Resp 16 | Wt 157.0 lb

## 2024-08-22 DIAGNOSIS — Z5111 Encounter for antineoplastic chemotherapy: Secondary | ICD-10-CM | POA: Diagnosis not present

## 2024-08-22 DIAGNOSIS — C187 Malignant neoplasm of sigmoid colon: Secondary | ICD-10-CM | POA: Diagnosis present

## 2024-08-22 DIAGNOSIS — Z5112 Encounter for antineoplastic immunotherapy: Secondary | ICD-10-CM | POA: Diagnosis not present

## 2024-08-22 DIAGNOSIS — Z807 Family history of other malignant neoplasms of lymphoid, hematopoietic and related tissues: Secondary | ICD-10-CM | POA: Diagnosis not present

## 2024-08-22 DIAGNOSIS — Z7952 Long term (current) use of systemic steroids: Secondary | ICD-10-CM | POA: Diagnosis not present

## 2024-08-22 DIAGNOSIS — C189 Malignant neoplasm of colon, unspecified: Secondary | ICD-10-CM

## 2024-08-22 DIAGNOSIS — C787 Secondary malignant neoplasm of liver and intrahepatic bile duct: Secondary | ICD-10-CM

## 2024-08-22 DIAGNOSIS — I10 Essential (primary) hypertension: Secondary | ICD-10-CM | POA: Diagnosis not present

## 2024-08-22 DIAGNOSIS — E785 Hyperlipidemia, unspecified: Secondary | ICD-10-CM | POA: Insufficient documentation

## 2024-08-22 DIAGNOSIS — Z79899 Other long term (current) drug therapy: Secondary | ICD-10-CM | POA: Diagnosis not present

## 2024-08-22 DIAGNOSIS — Z5189 Encounter for other specified aftercare: Secondary | ICD-10-CM | POA: Diagnosis not present

## 2024-08-22 LAB — CBC WITH DIFFERENTIAL (CANCER CENTER ONLY)
Abs Immature Granulocytes: 0.48 K/uL — ABNORMAL HIGH (ref 0.00–0.07)
Basophils Absolute: 0.1 K/uL (ref 0.0–0.1)
Basophils Relative: 1 %
Eosinophils Absolute: 0.2 K/uL (ref 0.0–0.5)
Eosinophils Relative: 2 %
HCT: 39.2 % (ref 36.0–46.0)
Hemoglobin: 12.7 g/dL (ref 12.0–15.0)
Immature Granulocytes: 5 %
Lymphocytes Relative: 21 %
Lymphs Abs: 2 K/uL (ref 0.7–4.0)
MCH: 31.6 pg (ref 26.0–34.0)
MCHC: 32.4 g/dL (ref 30.0–36.0)
MCV: 97.5 fL (ref 80.0–100.0)
Monocytes Absolute: 0.7 K/uL (ref 0.1–1.0)
Monocytes Relative: 7 %
Neutro Abs: 6.2 K/uL (ref 1.7–7.7)
Neutrophils Relative %: 64 %
Platelet Count: 117 K/uL — ABNORMAL LOW (ref 150–400)
RBC: 4.02 MIL/uL (ref 3.87–5.11)
RDW: 16.5 % — ABNORMAL HIGH (ref 11.5–15.5)
WBC Count: 9.5 K/uL (ref 4.0–10.5)
nRBC: 0 % (ref 0.0–0.2)

## 2024-08-22 LAB — CMP (CANCER CENTER ONLY)
ALT: 38 U/L (ref 0–44)
AST: 43 U/L — ABNORMAL HIGH (ref 15–41)
Albumin: 3.4 g/dL — ABNORMAL LOW (ref 3.5–5.0)
Alkaline Phosphatase: 157 U/L — ABNORMAL HIGH (ref 38–126)
Anion gap: 7 (ref 5–15)
BUN: 7 mg/dL (ref 6–20)
CO2: 22 mmol/L (ref 22–32)
Calcium: 10 mg/dL (ref 8.9–10.3)
Chloride: 104 mmol/L (ref 98–111)
Creatinine: 0.56 mg/dL (ref 0.44–1.00)
GFR, Estimated: >60 mL/min (ref >60)
Glucose, Bld: 102 mg/dL — ABNORMAL HIGH (ref 70–99)
Potassium: 3.8 mmol/L (ref 3.5–5.1)
Sodium: 133 mmol/L — ABNORMAL LOW (ref 135–145)
Total Bilirubin: 1 mg/dL (ref 0.0–1.2)
Total Protein: 6.3 g/dL — ABNORMAL LOW (ref 6.5–8.1)

## 2024-08-22 LAB — TOTAL PROTEIN, URINE DIPSTICK: Protein, ur: NEGATIVE mg/dL

## 2024-08-22 LAB — MAGNESIUM: Magnesium: 1.7 mg/dL (ref 1.7–2.4)

## 2024-08-22 MED ORDER — DEXAMETHASONE SODIUM PHOSPHATE 10 MG/ML IJ SOLN
10.0000 mg | Freq: Once | INTRAMUSCULAR | Status: AC
  Administered 2024-08-22: 10 mg via INTRAVENOUS
  Filled 2024-08-22: qty 1

## 2024-08-22 MED ORDER — PALONOSETRON HCL INJECTION 0.25 MG/5ML
0.2500 mg | Freq: Once | INTRAVENOUS | Status: AC
  Administered 2024-08-22: 0.25 mg via INTRAVENOUS
  Filled 2024-08-22: qty 5

## 2024-08-22 MED ORDER — METHYLPREDNISOLONE SODIUM SUCC 125 MG IJ SOLR
125.0000 mg | Freq: Once | INTRAMUSCULAR | Status: AC | PRN
  Administered 2024-08-22: 125 mg via INTRAVENOUS

## 2024-08-22 MED ORDER — DIPHENHYDRAMINE HCL 50 MG/ML IJ SOLN
50.0000 mg | Freq: Once | INTRAMUSCULAR | Status: AC | PRN
  Administered 2024-08-22: 50 mg via INTRAVENOUS

## 2024-08-22 MED ORDER — SODIUM CHLORIDE 0.9 % IV SOLN
6.0000 mg/kg | Freq: Once | INTRAVENOUS | Status: AC
  Administered 2024-08-22: 400 mg via INTRAVENOUS
  Filled 2024-08-22: qty 20

## 2024-08-22 MED ORDER — SODIUM CHLORIDE 0.9 % IV SOLN
INTRAVENOUS | Status: DC
  Filled 2024-08-22: qty 250

## 2024-08-22 MED ORDER — DEXTROSE 5 % IV SOLN
INTRAVENOUS | Status: DC
  Filled 2024-08-22: qty 250

## 2024-08-22 MED ORDER — PROCHLORPERAZINE EDISYLATE 10 MG/2ML IJ SOLN
10.0000 mg | Freq: Once | INTRAMUSCULAR | Status: AC
  Administered 2024-08-22: 10 mg via INTRAVENOUS
  Filled 2024-08-22: qty 2

## 2024-08-22 MED ORDER — SODIUM CHLORIDE 0.9 % IV SOLN
Freq: Once | INTRAVENOUS | Status: DC | PRN
  Filled 2024-08-22: qty 250

## 2024-08-22 MED ORDER — SODIUM CHLORIDE 0.9 % IV SOLN
2400.0000 mg/m2 | INTRAVENOUS | Status: DC
  Administered 2024-08-22: 4300 mg via INTRAVENOUS
  Filled 2024-08-22: qty 86

## 2024-08-22 MED ORDER — FLUOROURACIL CHEMO INJECTION 2.5 GM/50ML
400.0000 mg/m2 | Freq: Once | INTRAVENOUS | Status: AC
  Administered 2024-08-22: 700 mg via INTRAVENOUS
  Filled 2024-08-22: qty 14

## 2024-08-22 MED ORDER — FAMOTIDINE IN NACL 20-0.9 MG/50ML-% IV SOLN
20.0000 mg | Freq: Once | INTRAVENOUS | Status: AC | PRN
  Administered 2024-08-22: 20 mg via INTRAVENOUS

## 2024-08-22 MED ORDER — LEUCOVORIN CALCIUM INJECTION 350 MG
400.0000 mg/m2 | Freq: Once | INTRAVENOUS | Status: AC
  Administered 2024-08-22: 720 mg via INTRAVENOUS
  Filled 2024-08-22: qty 25

## 2024-08-22 MED ORDER — OXALIPLATIN CHEMO INJECTION 100 MG/20ML
85.0000 mg/m2 | Freq: Once | INTRAVENOUS | Status: AC
  Administered 2024-08-22: 150 mg via INTRAVENOUS
  Filled 2024-08-22: qty 20

## 2024-08-22 NOTE — Progress Notes (Signed)
 Called Radiology [phone] and requested a STAT read.  Order will be expedited.

## 2024-08-22 NOTE — Progress Notes (Signed)
 Hypersensitivity Reaction note  Date of event: 08/22/24 Time of event: 1353 Generic name of drug involved: oxaliplatin  Name of provider notified of the hypersensitivity reaction: Dasie, NP Was agent that likely caused hypersensitivity reaction added to Allergies List within EMR? yes Chain of events including reaction signs/symptoms, treatment administered, and outcome (e.g., drug resumed; drug discontinued; sent to Emergency Department; etc.)   1340: Oxaliplatin  infusion started 1353: Patient states she doesn't feel right. Infusion stopped. Pt says her stomach has starting hurting and her left arm had started hurting. She also was noted to have flushing at this time. Tinnie Dasie, NP called.  1355: 50 mg iv benadryl  given and NS bolus started at 999 ml/hr 1356: Dasie, NP at chairside to assess. 125 mg solumedrol given 1357: 20 mg pepcid  started at 999 ml/hr 1359: bp 173/102, HR 102 1404: 10 mg iv compazine  given for nausea per Dasie, NP 1411: bp 169/83 pt states she is starting to feel better 1421: bp 152/77. Dasie, NP updated that pt is feeling better just drowsy from iv benadryl . Per Dasie, NP will hold this dose of oxaliplatin  but will proceed with adrucil  1455: NS bolus stopped 1504: bp 126/72 and pt is back to baseline 1544: pt given adrucil  push and was discharged with adrucil  pump infusing. Pt stable at discharge and educated to notify us  with any concerns or symptoms   Burnard Arts, RN 08/22/2024 4:36 PM

## 2024-08-23 ENCOUNTER — Other Ambulatory Visit: Payer: Self-pay

## 2024-08-24 ENCOUNTER — Other Ambulatory Visit: Payer: Self-pay

## 2024-08-24 ENCOUNTER — Other Ambulatory Visit: Payer: Self-pay | Admitting: Oncology

## 2024-08-24 ENCOUNTER — Inpatient Hospital Stay

## 2024-08-24 DIAGNOSIS — C189 Malignant neoplasm of colon, unspecified: Secondary | ICD-10-CM

## 2024-08-24 MED ORDER — DOXYCYCLINE HYCLATE 100 MG PO CAPS
100.0000 mg | ORAL_CAPSULE | Freq: Two times a day (BID) | ORAL | 0 refills | Status: DC
Start: 2024-08-24 — End: 2024-11-02
  Filled 2024-08-24: qty 56, 28d supply, fill #0

## 2024-08-25 ENCOUNTER — Telehealth: Payer: Self-pay

## 2024-08-25 ENCOUNTER — Other Ambulatory Visit: Payer: Self-pay | Admitting: Oncology

## 2024-08-25 NOTE — Telephone Encounter (Signed)
 Referral has been sent to Dr. Romero at [facility] to evaluate for HAI.

## 2024-08-27 ENCOUNTER — Encounter: Payer: Self-pay | Admitting: Oncology

## 2024-08-27 NOTE — Progress Notes (Signed)
 Hematology/Oncology Consult note [facility]  Telephone:([phone] Fax:([phone]  Patient Care Team: Marylynn Verneita CROME, MD as PCP - General (Internal Medicine) Maurie Rayfield BIRCH, RN as Oncology Nurse Navigator Melanee Annah BROCKS, MD as Consulting Physician (Oncology)   Name of the patient: Carla Mckinney  [id number]  [birth year]   Date of visit: 08/27/24  Diagnosis- stage IV adenocarcinoma of the colon with liver metastases   Chief complaint/ Reason for visit-discuss CT scan results and further management on treatment assessment prior to cycle 7 of FOLFOX panitumumab  chemotherapy  Heme/Onc history: patient is a 63 year old female with a past medical history significant for hypercalcemia due to hyperparathyroidism, hyperlipidemia among other medical problems. She was noted to have bright red blood in her stools along with symptoms of unintentional weight loss which has been going on for the last 6 weeks.  She has lost about 20 pounds of weight.  Her last colonoscopy in 2018 was unremarkable.  Patient was seen by [facility] and colonoscopy is planned a month from now.  Patient underwent CT abdomen and pelvis with contrast on 05/03/2024 for symptoms of right upper quadrant abdominal pain which showed large enhancing masses in the liver measuring up to 12.5 cm in the right hepatic lobe and 8.5 cm in the posterior right hepatic dome.  No evidence of intra-abdominal adenopathy.  This was followed by MRI abdomen with and without contrast which was ordered by me on 05/04/2024 which showed extremely bulky heterogeneously hypoenhancing masses occupying the majority of the right lobe of the liver measuring 14.9 x 12.9 x 9.1 cm.  Occasional incidental benign cyst throughout the liver.  Pancreas, spleen and adrenal glands and kidney appeared normal.  Stomach and intra-abdominal lymph nodes appeared normal.  Rectosigmoid mass was noted along with mesenteric adenopathy concerning for  primary origin of these metastases.  aFP was normal.  CEA was elevated at 305.  CA 19-9 elevated at 176.MMR stable.  Patient was given 1 dose of FOLFOX Avastin  chemotherapy.  However after K-ras and NRAS wild-type testing and given the left-sided tumor she was switched to FOLFOX panitumumab       NGS testing showed T p53, APC, BRAF, APC, PHLPP 1, PPP2R2A SMAD4 mutations.  K-ras and NRAS negative.  MSI stable.  MMR normal.  Tumor mutational burden low.   Given the left-sided tumor, bulky liver metastases and BRAF/NRAS wild-type patient has been on FOLFOX panitumumab  chemotherapy since June 2025  Interval history-tolerating treatments relatively well so far.  She remains on doxycycline  prophylaxis for panitumumab  induced skin rash.  She has some baseline fatigue.  Denies any new complaints today  ECOG PS- 1 Pain scale- 0   Review of systems- Review of Systems  Constitutional:  Positive for malaise/fatigue. Negative for chills, fever and weight loss.  HENT:  Negative for congestion, ear discharge and nosebleeds.   Eyes:  Negative for blurred vision.  Respiratory:  Negative for cough, hemoptysis, sputum production, shortness of breath and wheezing.   Cardiovascular:  Negative for chest pain, palpitations, orthopnea and claudication.  Gastrointestinal:  Negative for abdominal pain, blood in stool, constipation, diarrhea, heartburn, melena, nausea and vomiting.  Genitourinary:  Negative for dysuria, flank pain, frequency, hematuria and urgency.  Musculoskeletal:  Negative for back pain, joint pain and myalgias.  Skin:  Negative for rash.  Neurological:  Negative for dizziness, tingling, focal weakness, seizures, weakness and headaches.  Endo/Heme/Allergies:  Does not bruise/bleed easily.  Psychiatric/Behavioral:  Negative for depression and suicidal ideas. The  patient does not have insomnia.       Allergies  Allergen Reactions   Oxaliplatin  Hives, Nausea And Vomiting and Other (See Comments)     Hip pain   Lisinopril  Swelling     Past Medical History:  Diagnosis Date   Hypertension    Rheumatic fever 1986   has had echo since then. no cardiac issues.     Past Surgical History:  Procedure Laterality Date   ABDOMINAL HYSTERECTOMY     AUGMENTATION MAMMAPLASTY Bilateral    breast lift   CARDIAC CATHETERIZATION  2002   normal, due to syncope and abnormal myowiew ([place])   COLONOSCOPY WITH PROPOFOL  N/A 09/15/2017   Procedure: COLONOSCOPY WITH PROPOFOL ;  Surgeon: Unk Corinn Skiff, MD;  Location: [facility];  Service: Gastroenterology;  Laterality: N/A;   IR IMAGING GUIDED PORT INSERTION  05/11/2024   MASTOPEXY  2008   OOPHORECTOMY     secondary to scar tissue,  [facility]   RIGHT OOPHORECTOMY     TONSILLECTOMY     TUBAL LIGATION      Social History   Socioeconomic History   Marital status: Married    Spouse name: Not on file   Number of children: 2   Years of education: Not on file   Highest education level: Associate degree: academic program  Occupational History   Not on file  Tobacco Use   Smoking status: Never   Smokeless tobacco: Never  Vaping Use   Vaping status: Never Used  Substance and Sexual Activity   Alcohol use: Yes    Alcohol/week: 1.0 standard drink of alcohol    Types: 1 Glasses of wine per week    Comment: socially   Drug use: No   Sexual activity: Yes    Birth control/protection: None  Other Topics Concern   Not on file  Social History Narrative   Not on file   Social Drivers of Health   Financial Resource Strain: Low Risk  (11/16/2023)   Overall Financial Resource Strain (CARDIA)    Difficulty of Paying Living Expenses: Not hard at all  Food Insecurity: No Food Insecurity (05/05/2024)   Hunger Vital Sign    Worried About Running Out of Food in the Last Year: Never true    Ran Out of Food in the Last Year: Never true  Transportation Needs: No Transportation Needs (05/05/2024)   PRAPARE - Scientist, research (physical sciences) (Medical): No    Lack of Transportation (Non-Medical): No  Physical Activity: Insufficiently Active (05/05/2024)   Exercise Vital Sign    Days of Exercise per Week: 2 days    Minutes of Exercise per Session: 20 min  Stress: No Stress Concern Present (11/16/2023)   [employer] of Occupational Health - Occupational Stress Questionnaire    Feeling of Stress : Not at all  Social Connections: Moderately Integrated (11/16/2023)   Social Connection and Isolation Panel    Frequency of Communication with Friends and Family: More than three times a week    Frequency of Social Gatherings with Friends and Family: Twice a week    Attends Religious Services: More than 4 times per year    Active Member of [employer] or Organizations: No    Attends Banker Meetings: Not on file    Marital Status: Married  Intimate Partner Violence: Not At Risk (05/05/2024)   Humiliation, Afraid, Rape, and Kick questionnaire    Fear of Current or Ex-Partner: No    Emotionally Abused: No  Physically Abused: No    Sexually Abused: No    Family History  Problem Relation Age of Onset   Heart disease Mother    Heart failure Mother    Hypertension Father    Hyperlipidemia Father    Heart disease Maternal Grandfather    Heart disease Paternal Grandmother    Stroke Paternal Grandmother 36   Cancer Paternal Grandfather        multiple myeloma   Breast cancer Neg Hx      Current Outpatient Medications:    acetaminophen (TYLENOL) 325 MG tablet, Take 650 mg by mouth every 6 (six) hours as needed., Disp: , Rfl:    clindamycin  (CLEOCIN  T) 1 % lotion, Apply topically 2 (two) times daily., Disp: 60 mL, Rfl: 0   dexamethasone  (DECADRON ) 4 MG tablet, Take 2 tablets (8 mg total) by mouth daily. Start the day after chemotherapy for 2 days. Take with food., Disp: 30 tablet, Rfl: 1   diltiazem  (CARDIZEM ) 30 MG tablet, Take 1 tablet (30 mg total) by mouth as needed (rapid heart rate)., Disp: 90  tablet, Rfl: 2   escitalopram  (LEXAPRO ) 10 MG tablet, Take 1 tablet (10 mg total) by mouth daily., Disp: 30 tablet, Rfl: 0   hydrocortisone  1 % lotion, Apply 1 Application topically 2 (two) times daily., Disp: 114 g, Rfl: 0   lactulose  (CHRONULAC ) 10 GM/15ML solution, Take 15-30 mLs (10-20 g total) by mouth 2 (two) times daily as needed for mild constipation., Disp: 236 mL, Rfl: 0   lidocaine -prilocaine  (EMLA ) cream, Apply to affected area once, Disp: 30 g, Rfl: 3   magic mouthwash (multi-ingredient) oral suspension, Take 5 mLs by mouth 4 (four) times daily as needed for mouth pain., Disp: 280 mL, Rfl: 3   metoprolol  succinate (TOPROL -XL) 100 MG 24 hr tablet, Take 1 tablet (100 mg total) by mouth daily., Disp: 90 tablet, Rfl: 1   ondansetron  (ZOFRAN ) 8 MG tablet, Take 1 tablet (8 mg total) by mouth every 8 (eight) hours as needed for nausea or vomiting. Start on the third day after chemotherapy., Disp: 30 tablet, Rfl: 1   oxyCODONE  (OXY IR/ROXICODONE ) 5 MG immediate release tablet, Take 1 tablet (5 mg total) by mouth every 4 (four) hours as needed for severe pain (pain score 7-10)., Disp: 120 tablet, Rfl: 0   pantoprazole  (PROTONIX ) 20 MG tablet, Take 1 tablet (20 mg total) by mouth daily., Disp: 30 tablet, Rfl: 3   prochlorperazine  (COMPAZINE ) 10 MG tablet, Take 1 tablet (10 mg total) by mouth every 6 (six) hours as needed for nausea or vomiting., Disp: 30 tablet, Rfl: 1   amLODipine  (NORVASC ) 10 MG tablet, Take 1 tablet (10 mg total) by mouth daily., Disp: 90 tablet, Rfl: 1   cholecalciferol (VITAMIN D3) 25 MCG (1000 UNIT) tablet, Take 1,000 Units by mouth daily. (Patient not taking: Reported on 08/22/2024), Disp: , Rfl:    doxycycline  (VIBRAMYCIN ) 100 MG capsule, Take 1 capsule (100 mg total) by mouth 2 (two) times daily. Take for 4 weeks, Disp: 56 capsule, Rfl: 0   levofloxacin  (LEVAQUIN ) 750 MG tablet, Take 1 tablet (750 mg total) by mouth daily. (Patient not taking: Reported on 08/22/2024), Disp: 7  tablet, Rfl: 0 No current facility-administered medications for this visit.  Facility-Administered Medications Ordered in Other Visits:    sodium chloride  flush (NS) 0.9 % injection 10 mL, 10 mL, Intracatheter, PRN, Melanee Annah BROCKS, MD, 10 mL at 07/26/24 1117  Physical exam:  Vitals:   08/22/24 1041  BP: ROLLEN)  140/79  Pulse: 70  Resp: 16  Temp: 97.7 F (36.5 C)  TempSrc: Tympanic  SpO2: 100%  Weight: 157 lb (71.2 kg)   Physical Exam Cardiovascular:     Rate and Rhythm: Normal rate and regular rhythm.     Heart sounds: Normal heart sounds.  Pulmonary:     Effort: Pulmonary effort is normal.     Breath sounds: Normal breath sounds.  Abdominal:     General: Bowel sounds are normal.     Palpations: Abdomen is soft.  Skin:    General: Skin is warm and dry.  Neurological:     Mental Status: She is alert and oriented to person, place, and time.      I have personally reviewed labs listed below:    Latest Ref Rng & Units 08/22/2024   10:26 AM  CMP  Glucose 70 - 99 mg/dL 897   BUN 6 - 20 mg/dL 7   Creatinine 9.55 - 8.99 mg/dL 9.43   Sodium 864 - 854 mmol/L 133   Potassium 3.5 - 5.1 mmol/L 3.8   Chloride 98 - 111 mmol/L 104   CO2 22 - 32 mmol/L 22   Calcium  8.9 - 10.3 mg/dL 89.9   Total Protein 6.5 - 8.1 g/dL 6.3   Total Bilirubin 0.0 - 1.2 mg/dL 1.0   Alkaline Phos 38 - 126 U/L 157   AST 15 - 41 U/L 43   ALT 0 - 44 U/L 38       Latest Ref Rng & Units 08/22/2024   10:26 AM  CBC  WBC 4.0 - 10.5 K/uL 9.5   Hemoglobin 12.0 - 15.0 g/dL 87.2   Hematocrit 63.9 - 46.0 % 39.2   Platelets 150 - 400 K/uL 117    I have personally reviewed Radiology images listed below: No images are attached to the encounter.  CT CHEST ABDOMEN PELVIS W CONTRAST Result Date: 08/22/2024 CLINICAL DATA:  Metastatic colon cancer.  * Tracking Code: BO * EXAM: CT CHEST, ABDOMEN, AND PELVIS WITH CONTRAST TECHNIQUE: Multidetector CT imaging of the chest, abdomen and pelvis was performed following the  standard protocol during bolus administration of intravenous contrast. RADIATION DOSE REDUCTION: This exam was performed according to the departmental dose-optimization program which includes automated exposure control, adjustment of the mA and/or kV according to patient size and/or use of iterative reconstruction technique. CONTRAST:  OMNIPAQUE  IOHEXOL  300 MG/ML  SOLN COMPARISON:  Nuclear medicine PET dated 05/16/2024, MRI abdomen dated 05/04/2024 FINDINGS: CT CHEST FINDINGS Cardiovascular: Right chest wall port terminates in the lower SVC. Normal heart size. No significant pericardial fluid/thickening. Great vessels are normal in course and caliber. No central pulmonary emboli. Mediastinum/Nodes: Imaged thyroid  gland without nodules meeting criteria for imaging follow-up by size. Normal esophagus. No pathologically enlarged axillary, supraclavicular, mediastinal, or hilar lymph nodes. Lungs/Pleura: The central airways are patent. Unchanged scattered bilateral pulmonary nodules measuring up to 5 x 3 mm in the anterior left upper lobe (4:26), perifissural right upper lobe (4:80), right lower lobe (4:93), and left lower lobe (4:94, 98, 102). No focal consolidation. No pneumothorax. No pleural effusion. Musculoskeletal: No acute or abnormal lytic or blastic osseous lesions. CT ABDOMEN PELVIS FINDINGS Hepatobiliary: Interval decreased size of right hepatic masses, 6.3 x 5.1 cm segment 7 (2:46), previously 8.7 x 8.2 cm with posteromedial area of enhancement and 8.4 x 6.0 cm segment 5/6 (2:60), previously 12.1 x 8.4 cm. A small metastatic focus along the anterosuperior aspect of the segment 7 lesion measures 1.3  x 1.1 cm (2:41), previously 2.8 x 2.4 cm. Additional scattered hypodensities in keeping with cysts on prior MRI. No intra or extrahepatic biliary ductal dilation. Normal gallbladder. Pancreas: No focal lesions or main ductal dilation. Spleen: Normal in size without focal abnormality. Adrenals/Urinary Tract:  No adrenal nodules. No suspicious renal mass, calculi, or hydronephrosis. No focal bladder wall thickening. Stomach/Bowel: Normal appearance of the stomach. Interval improvement previously noted sigmoid colonic mural thickening. No abnormal bowel dilation. Normal appendix. Vascular/Lymphatic: Aortic atherosclerosis. Unchanged superior rectal lymph node measuring 8 mm (2:84). Decreased conspicuity of adjacent lymph nodes in this chain. Reproductive: No adnexal masses. Other: No free fluid, fluid collection, or free air. Musculoskeletal: No acute or abnormal lytic or blastic osseous findings. IMPRESSION: 1. Interval decreased size of right hepatic masses and decreased conspicuity of superior rectal lymph nodes. 2. Interval improved sigmoid colonic mural thickening. 3. Unchanged scattered bilateral pulmonary nodules, indeterminate. 4. No new or progressive metastatic disease in the chest, abdomen, or pelvis. 5.  Aortic Atherosclerosis (ICD10-I70.0). Electronically Signed   By: Limin  Xu M.D.   On: 08/22/2024 12:31     Assessment and plan- Patient is a 63 y.o. female with history of metastatic colon cancer with liver metastases wild-type RASTs here for on treatment assessment prior to cycle 7 of FOLFOX panitumumab  chemotherapy and discuss CT scan results and further management  After 3 months of FOLFOX panitumumab  chemotherapy patient has had a good response to treatment.  I have reviewed CT chest abdomen pelvis images independently and discussed findings with the patient which shows reduction in the size of liver metastases and no evidence of progression at this time.  Her liver mets are still fairly large some of them measuring close to 8 cm.  Also given the fact that they have multiple she is not presently a candidate for resection of these liver mets and resection of her primary sigmoid tumor.  I am however referring her to Dr. Romero at [facility] to see if she would be a candidate for HAI pump therapy.    I  plan to continue FOLFOX panitumumab  chemotherapy until progression or toxicity.  Tumor markers are coming down well.  She will remain on doxycycline  prophylaxis for panitumumab  induced skin rash   Visit Diagnosis 1. Colon cancer metastasized to liver (HCC)   2. Encounter for monoclonal antibody treatment for malignancy   3. Encounter for antineoplastic chemotherapy      Dr. Annah Skene, MD, MPH [facility] [phone] 08/27/2024 9:31 AM

## 2024-08-28 DIAGNOSIS — C787 Secondary malignant neoplasm of liver and intrahepatic bile duct: Secondary | ICD-10-CM | POA: Diagnosis not present

## 2024-08-28 DIAGNOSIS — C189 Malignant neoplasm of colon, unspecified: Secondary | ICD-10-CM | POA: Diagnosis not present

## 2024-08-28 DIAGNOSIS — R590 Localized enlarged lymph nodes: Secondary | ICD-10-CM | POA: Diagnosis not present

## 2024-08-28 DIAGNOSIS — R933 Abnormal findings on diagnostic imaging of other parts of digestive tract: Secondary | ICD-10-CM | POA: Diagnosis not present

## 2024-08-30 DIAGNOSIS — K7689 Other specified diseases of liver: Secondary | ICD-10-CM | POA: Diagnosis not present

## 2024-08-30 DIAGNOSIS — C189 Malignant neoplasm of colon, unspecified: Secondary | ICD-10-CM | POA: Diagnosis not present

## 2024-08-30 DIAGNOSIS — K769 Liver disease, unspecified: Secondary | ICD-10-CM | POA: Diagnosis not present

## 2024-08-30 NOTE — Telephone Encounter (Signed)
I called and spoke to her.

## 2024-08-31 DIAGNOSIS — C19 Malignant neoplasm of rectosigmoid junction: Secondary | ICD-10-CM | POA: Diagnosis not present

## 2024-08-31 DIAGNOSIS — C189 Malignant neoplasm of colon, unspecified: Secondary | ICD-10-CM | POA: Diagnosis not present

## 2024-08-31 DIAGNOSIS — C787 Secondary malignant neoplasm of liver and intrahepatic bile duct: Secondary | ICD-10-CM | POA: Diagnosis not present

## 2024-09-01 DIAGNOSIS — C189 Malignant neoplasm of colon, unspecified: Secondary | ICD-10-CM | POA: Diagnosis not present

## 2024-09-01 DIAGNOSIS — C787 Secondary malignant neoplasm of liver and intrahepatic bile duct: Secondary | ICD-10-CM | POA: Diagnosis not present

## 2024-09-04 ENCOUNTER — Encounter: Payer: Self-pay | Admitting: Oncology

## 2024-09-04 ENCOUNTER — Inpatient Hospital Stay

## 2024-09-04 ENCOUNTER — Inpatient Hospital Stay (HOSPITAL_BASED_OUTPATIENT_CLINIC_OR_DEPARTMENT_OTHER): Admitting: Oncology

## 2024-09-04 VITALS — BP 132/77 | HR 67

## 2024-09-04 VITALS — BP 132/74 | HR 71 | Temp 96.8°F | Resp 18 | Ht 63.0 in | Wt 161.6 lb

## 2024-09-04 DIAGNOSIS — C189 Malignant neoplasm of colon, unspecified: Secondary | ICD-10-CM | POA: Diagnosis not present

## 2024-09-04 DIAGNOSIS — Z5111 Encounter for antineoplastic chemotherapy: Secondary | ICD-10-CM

## 2024-09-04 DIAGNOSIS — Z5112 Encounter for antineoplastic immunotherapy: Secondary | ICD-10-CM | POA: Diagnosis not present

## 2024-09-04 DIAGNOSIS — C787 Secondary malignant neoplasm of liver and intrahepatic bile duct: Secondary | ICD-10-CM | POA: Diagnosis not present

## 2024-09-04 LAB — TOTAL PROTEIN, URINE DIPSTICK: Protein, ur: NEGATIVE mg/dL

## 2024-09-04 LAB — CBC WITH DIFFERENTIAL (CANCER CENTER ONLY)
Abs Immature Granulocytes: 0.02 K/uL (ref 0.00–0.07)
Basophils Absolute: 0.1 K/uL (ref 0.0–0.1)
Basophils Relative: 1 %
Eosinophils Absolute: 0.2 K/uL (ref 0.0–0.5)
Eosinophils Relative: 5 %
HCT: 36.3 % (ref 36.0–46.0)
Hemoglobin: 11.8 g/dL — ABNORMAL LOW (ref 12.0–15.0)
Immature Granulocytes: 1 %
Lymphocytes Relative: 29 %
Lymphs Abs: 1.2 K/uL (ref 0.7–4.0)
MCH: 32 pg (ref 26.0–34.0)
MCHC: 32.5 g/dL (ref 30.0–36.0)
MCV: 98.4 fL (ref 80.0–100.0)
Monocytes Absolute: 0.6 K/uL (ref 0.1–1.0)
Monocytes Relative: 13 %
Neutro Abs: 2.2 K/uL (ref 1.7–7.7)
Neutrophils Relative %: 51 %
Platelet Count: 143 K/uL — ABNORMAL LOW (ref 150–400)
RBC: 3.69 MIL/uL — ABNORMAL LOW (ref 3.87–5.11)
RDW: 15.5 % (ref 11.5–15.5)
WBC Count: 4.3 K/uL (ref 4.0–10.5)
nRBC: 0 % (ref 0.0–0.2)

## 2024-09-04 LAB — CMP (CANCER CENTER ONLY)
ALT: 29 U/L (ref 0–44)
AST: 38 U/L (ref 15–41)
Albumin: 3.2 g/dL — ABNORMAL LOW (ref 3.5–5.0)
Alkaline Phosphatase: 115 U/L (ref 38–126)
Anion gap: 8 (ref 5–15)
BUN: <5 mg/dL — ABNORMAL LOW (ref 6–20)
CO2: 22 mmol/L (ref 22–32)
Calcium: 9.9 mg/dL (ref 8.9–10.3)
Chloride: 107 mmol/L (ref 98–111)
Creatinine: 0.35 mg/dL — ABNORMAL LOW (ref 0.44–1.00)
GFR, Estimated: >60 mL/min (ref >60)
Glucose, Bld: 123 mg/dL — ABNORMAL HIGH (ref 70–99)
Potassium: 3.5 mmol/L (ref 3.5–5.1)
Sodium: 137 mmol/L (ref 135–145)
Total Bilirubin: 1 mg/dL (ref 0.0–1.2)
Total Protein: 5.9 g/dL — ABNORMAL LOW (ref 6.5–8.1)

## 2024-09-04 LAB — MAGNESIUM: Magnesium: 1.7 mg/dL (ref 1.7–2.4)

## 2024-09-04 MED ORDER — DEXAMETHASONE SODIUM PHOSPHATE 10 MG/ML IJ SOLN
10.0000 mg | Freq: Once | INTRAMUSCULAR | Status: AC
  Administered 2024-09-04: 10 mg via INTRAVENOUS
  Filled 2024-09-04: qty 1

## 2024-09-04 MED ORDER — OXALIPLATIN CHEMO INJECTION 100 MG/20ML
65.0000 mg/m2 | Freq: Once | INTRAVENOUS | Status: AC
  Administered 2024-09-04: 115 mg via INTRAVENOUS
  Filled 2024-09-04: qty 3

## 2024-09-04 MED ORDER — SODIUM CHLORIDE 0.9 % IV SOLN
6.0000 mg/kg | Freq: Once | INTRAVENOUS | Status: AC
  Administered 2024-09-04: 400 mg via INTRAVENOUS
  Filled 2024-09-04: qty 20

## 2024-09-04 MED ORDER — SODIUM CHLORIDE 0.9 % IV SOLN
2400.0000 mg/m2 | INTRAVENOUS | Status: DC
  Administered 2024-09-04: 4300 mg via INTRAVENOUS
  Filled 2024-09-04: qty 86

## 2024-09-04 MED ORDER — DEXTROSE 5 % IV SOLN
INTRAVENOUS | Status: DC
  Filled 2024-09-04: qty 250

## 2024-09-04 MED ORDER — FAMOTIDINE IN NACL 20-0.9 MG/50ML-% IV SOLN
20.0000 mg | Freq: Once | INTRAVENOUS | Status: AC
  Administered 2024-09-04: 20 mg via INTRAVENOUS
  Filled 2024-09-04: qty 50

## 2024-09-04 MED ORDER — SODIUM CHLORIDE 0.9% FLUSH
10.0000 mL | Freq: Once | INTRAVENOUS | Status: AC
  Administered 2024-09-04: 10 mL via INTRAVENOUS
  Filled 2024-09-04: qty 10

## 2024-09-04 MED ORDER — LEUCOVORIN CALCIUM INJECTION 350 MG
400.0000 mg/m2 | Freq: Once | INTRAVENOUS | Status: AC
  Administered 2024-09-04: 720 mg via INTRAVENOUS
  Filled 2024-09-04: qty 18.5

## 2024-09-04 MED ORDER — PALONOSETRON HCL INJECTION 0.25 MG/5ML
0.2500 mg | Freq: Once | INTRAVENOUS | Status: AC
  Administered 2024-09-04: 0.25 mg via INTRAVENOUS
  Filled 2024-09-04: qty 5

## 2024-09-04 MED ORDER — DIPHENHYDRAMINE HCL 50 MG/ML IJ SOLN
25.0000 mg | Freq: Once | INTRAMUSCULAR | Status: AC
  Administered 2024-09-04: 25 mg via INTRAVENOUS
  Filled 2024-09-04: qty 1

## 2024-09-04 MED ORDER — SODIUM CHLORIDE 0.9 % IV SOLN
INTRAVENOUS | Status: DC
  Filled 2024-09-04: qty 250

## 2024-09-04 MED ORDER — FLUOROURACIL CHEMO INJECTION 2.5 GM/50ML
400.0000 mg/m2 | Freq: Once | INTRAVENOUS | Status: AC
  Administered 2024-09-04: 700 mg via INTRAVENOUS
  Filled 2024-09-04: qty 14

## 2024-09-04 NOTE — Progress Notes (Signed)
 Hematology/Oncology Consult note [facility]  Telephone:([phone] [phone]  Patient Care Team: Marylynn Verneita CROME, MD as PCP - General (Internal Medicine) Maurie Rayfield BIRCH, RN as Oncology Nurse Navigator Melanee Annah BROCKS, MD as Consulting Physician (Oncology)   Name of the patient: Carla Mckinney  [id number]  July 30, 1961   Date of visit: 09/04/24  Diagnosis- stage IV adenocarcinoma of the colon with liver metastases   Chief complaint/ Reason for visit-on treatment assessment prior to cycle 8 of FOLFOX panitumumab  chemotherapy  Heme/Onc history: patient is a 63 year old female with a past medical history significant for hypercalcemia due to hyperparathyroidism, hyperlipidemia among other medical problems. She was noted to have bright red blood in her stools along with symptoms of unintentional weight loss which has been going on for the last 6 weeks.  She has lost about 20 pounds of weight.  Her last colonoscopy in 2018 was unremarkable.  Patient was seen by [facility] and colonoscopy is planned a month from now.  Patient underwent CT abdomen and pelvis with contrast on 05/03/2024 for symptoms of right upper quadrant abdominal pain which showed large enhancing masses in the liver measuring up to 12.5 cm in the right hepatic lobe and 8.5 cm in the posterior right hepatic dome.  No evidence of intra-abdominal adenopathy.  This was followed by MRI abdomen with and without contrast which was ordered by me on 05/04/2024 which showed extremely bulky heterogeneously hypoenhancing masses occupying the majority of the right lobe of the liver measuring 14.9 x 12.9 x 9.1 cm.  Occasional incidental benign cyst throughout the liver.  Pancreas, spleen and adrenal glands and kidney appeared normal.  Stomach and intra-abdominal lymph nodes appeared normal.  Rectosigmoid mass was noted along with mesenteric adenopathy concerning for primary origin of these metastases.  aFP was  normal.  CEA was elevated at 305.  CA 19-9 elevated at 176.MMR stable.  Patient was given 1 dose of FOLFOX Avastin  chemotherapy.  However after K-ras and NRAS wild-type testing and given the left-sided tumor she was switched to FOLFOX panitumumab       NGS testing showed T p53, APC, BRAF, APC, PHLPP 1, PPP2R2A SMAD4 mutations.  K-ras and NRAS negative.  MSI stable.  MMR normal.  Tumor mutational burden low.   Given the left-sided tumor, bulky liver metastases and BRAF/NRAS wild-type patient has been on FOLFOX panitumumab  chemotherapy since June 2025    Interval history-patient is tolerating treatments well so far.  Skin rash is well-controlled with oral doxycycline .  During her last oxaliplatin  infusion patient developed epigastric pain followed by left shoulder pain and hip pain and her blood pressure went up in the 150s.  Oxaliplatin  was stopped and she was given Pepcid  and Benadryl .  ECOG PS- 0 Pain scale- 0   Review of systems- Review of Systems  Constitutional:  Positive for malaise/fatigue. Negative for chills, fever and weight loss.  HENT:  Negative for congestion, ear discharge and nosebleeds.   Eyes:  Negative for blurred vision.  Respiratory:  Negative for cough, hemoptysis, sputum production, shortness of breath and wheezing.   Cardiovascular:  Negative for chest pain, palpitations, orthopnea and claudication.  Gastrointestinal:  Negative for abdominal pain, blood in stool, constipation, diarrhea, heartburn, melena, nausea and vomiting.  Genitourinary:  Negative for dysuria, flank pain, frequency, hematuria and urgency.  Musculoskeletal:  Negative for back pain, joint pain and myalgias.  Skin:  Negative for rash.  Neurological:  Negative for dizziness, tingling, focal weakness, seizures, weakness  and headaches.  Endo/Heme/Allergies:  Does not bruise/bleed easily.  Psychiatric/Behavioral:  Negative for depression and suicidal ideas. The patient does not have insomnia.        Allergies  Allergen Reactions   Oxaliplatin  Hives, Nausea And Vomiting and Other (See Comments)    Hip pain   Lisinopril  Swelling     Past Medical History:  Diagnosis Date   Hypertension    Rheumatic fever 1986   has had echo since then. no cardiac issues.     Past Surgical History:  Procedure Laterality Date   ABDOMINAL HYSTERECTOMY     AUGMENTATION MAMMAPLASTY Bilateral    breast lift   CARDIAC CATHETERIZATION  2002   normal, due to syncope and abnormal myowiew ([place])   COLONOSCOPY WITH PROPOFOL  N/A 09/15/2017   Procedure: COLONOSCOPY WITH PROPOFOL ;  Surgeon: Unk Corinn Skiff, MD;  Location: [facility];  Service: Gastroenterology;  Laterality: N/A;   IR IMAGING GUIDED PORT INSERTION  05/11/2024   MASTOPEXY  2008   OOPHORECTOMY     secondary to scar tissue,  [facility]   RIGHT OOPHORECTOMY     TONSILLECTOMY     TUBAL LIGATION      Social History   Socioeconomic History   Marital status: Married    Spouse name: Not on file   Number of children: 2   Years of education: Not on file   Highest education level: Associate degree: academic program  Occupational History   Not on file  Tobacco Use   Smoking status: Never   Smokeless tobacco: Never  Vaping Use   Vaping status: Never Used  Substance and Sexual Activity   Alcohol use: Yes    Alcohol/week: 1.0 standard drink of alcohol    Types: 1 Glasses of wine per week    Comment: socially   Drug use: No   Sexual activity: Yes    Birth control/protection: None  Other Topics Concern   Not on file  Social History Narrative   Not on file   Social Drivers of Health   Financial Resource Strain: Low Risk  (11/16/2023)   Overall Financial Resource Strain (CARDIA)    Difficulty of Paying Living Expenses: Not hard at all  Food Insecurity: No Food Insecurity (05/05/2024)   Hunger Vital Sign    Worried About Running Out of Food in the Last Year: Never true    Ran Out of Food in the Last Year: Never true   Transportation Needs: No Transportation Needs (05/05/2024)   PRAPARE - Administrator, Civil Service (Medical): No    Lack of Transportation (Non-Medical): No  Physical Activity: Insufficiently Active (05/05/2024)   Exercise Vital Sign    Days of Exercise per Week: 2 days    Minutes of Exercise per Session: 20 min  Stress: No Stress Concern Present (11/16/2023)   [employer] of Occupational Health - Occupational Stress Questionnaire    Feeling of Stress : Not at all  Social Connections: Moderately Integrated (11/16/2023)   Social Connection and Isolation Panel    Frequency of Communication with Friends and Family: More than three times a week    Frequency of Social Gatherings with Friends and Family: Twice a week    Attends Religious Services: More than 4 times per year    Active Member of [employer] or Organizations: No    Attends Banker Meetings: Not on file    Marital Status: Married  Intimate Partner Violence: Not At Risk (05/05/2024)   Humiliation, Afraid,  Rape, and Kick questionnaire    Fear of Current or Ex-Partner: No    Emotionally Abused: No    Physically Abused: No    Sexually Abused: No    Family History  Problem Relation Age of Onset   Heart disease Mother    Heart failure Mother    Hypertension Father    Hyperlipidemia Father    Heart disease Maternal Grandfather    Heart disease Paternal Grandmother    Stroke Paternal Grandmother 44   Cancer Paternal Grandfather        multiple myeloma   Breast cancer Neg Hx      Current Outpatient Medications:    acetaminophen (TYLENOL) 325 MG tablet, Take 650 mg by mouth every 6 (six) hours as needed., Disp: , Rfl:    clindamycin  (CLEOCIN  T) 1 % lotion, Apply topically 2 (two) times daily., Disp: 60 mL, Rfl: 0   dexamethasone  (DECADRON ) 4 MG tablet, Take 2 tablets (8 mg total) by mouth daily. Start the day after chemotherapy for 2 days. Take with food., Disp: 30 tablet, Rfl: 1   diltiazem   (CARDIZEM ) 30 MG tablet, Take 1 tablet (30 mg total) by mouth as needed (rapid heart rate)., Disp: 90 tablet, Rfl: 2   doxycycline  (VIBRAMYCIN ) 100 MG capsule, Take 1 capsule (100 mg total) by mouth 2 (two) times daily. Take for 4 weeks, Disp: 56 capsule, Rfl: 0   escitalopram  (LEXAPRO ) 10 MG tablet, Take 1 tablet (10 mg total) by mouth daily., Disp: 30 tablet, Rfl: 0   hydrocortisone  1 % lotion, Apply 1 Application topically 2 (two) times daily., Disp: 114 g, Rfl: 0   lactulose  (CHRONULAC ) 10 GM/15ML solution, Take 15-30 mLs (10-20 g total) by mouth 2 (two) times daily as needed for mild constipation., Disp: 236 mL, Rfl: 0   lidocaine -prilocaine  (EMLA ) cream, Apply to affected area once, Disp: 30 g, Rfl: 3   magic mouthwash (multi-ingredient) oral suspension, Take 5 mLs by mouth 4 (four) times daily as needed for mouth pain., Disp: 280 mL, Rfl: 3   metoprolol  succinate (TOPROL -XL) 100 MG 24 hr tablet, Take 1 tablet (100 mg total) by mouth daily., Disp: 90 tablet, Rfl: 1   ondansetron  (ZOFRAN ) 8 MG tablet, Take 1 tablet (8 mg total) by mouth every 8 (eight) hours as needed for nausea or vomiting. Start on the third day after chemotherapy., Disp: 30 tablet, Rfl: 1   oxyCODONE  (OXY IR/ROXICODONE ) 5 MG immediate release tablet, Take 1 tablet (5 mg total) by mouth every 4 (four) hours as needed for severe pain (pain score 7-10)., Disp: 120 tablet, Rfl: 0   pantoprazole  (PROTONIX ) 20 MG tablet, Take 1 tablet (20 mg total) by mouth daily., Disp: 30 tablet, Rfl: 3   prochlorperazine  (COMPAZINE ) 10 MG tablet, Take 1 tablet (10 mg total) by mouth every 6 (six) hours as needed for nausea or vomiting., Disp: 30 tablet, Rfl: 1   amLODipine  (NORVASC ) 10 MG tablet, Take 1 tablet (10 mg total) by mouth daily., Disp: 90 tablet, Rfl: 1   cholecalciferol (VITAMIN D3) 25 MCG (1000 UNIT) tablet, Take 1,000 Units by mouth daily. (Patient not taking: Reported on 08/22/2024), Disp: , Rfl:    levofloxacin  (LEVAQUIN ) 750 MG  tablet, Take 1 tablet (750 mg total) by mouth daily. (Patient not taking: Reported on 08/22/2024), Disp: 7 tablet, Rfl: 0 No current facility-administered medications for this visit.  Facility-Administered Medications Ordered in Other Visits:    0.9 %  sodium chloride  infusion, , Intravenous, Continuous, Melanee Piggs  C, MD, Last Rate: 10 mL/hr at 09/04/24 0927, New Bag at 09/04/24 9072   dextrose  5 % solution, , Intravenous, Continuous, Melanee Annah BROCKS, MD, Last Rate: 10 mL/hr at 09/04/24 1114, New Bag at 09/04/24 1114   fluorouracil  (ADRUCIL ) 4,300 mg in sodium chloride  0.9 % 64 mL chemo infusion, 2,400 mg/m2 (Treatment Plan Recorded), Intravenous, 1 day or 1 dose, Melanee Annah BROCKS, MD   fluorouracil  (ADRUCIL ) chemo injection 700 mg, 400 mg/m2 (Treatment Plan Recorded), Intravenous, Once, Melanee Annah BROCKS, MD   leucovorin  720 mg in dextrose  5 % 250 mL infusion, 400 mg/m2 (Treatment Plan Recorded), Intravenous, Once, Melanee Annah BROCKS, MD, Last Rate: 95 mL/hr at 09/04/24 1116, 720 mg at 09/04/24 1116   oxaliplatin  (ELOXATIN ) 115 mg in dextrose  5 % 500 mL chemo infusion, 65 mg/m2 (Treatment Plan Recorded), Intravenous, Once, Melanee Annah BROCKS, MD, Last Rate: 174 mL/hr at 09/04/24 1118, 115 mg at 09/04/24 1118   sodium chloride  flush (NS) 0.9 % injection 10 mL, 10 mL, Intracatheter, PRN, Melanee Annah BROCKS, MD, 10 mL at 07/26/24 1117  Physical exam:  Vitals:   09/04/24 0846  BP: 132/74  Pulse: 71  Resp: 18  Temp: (!) 96.8 F (36 C)  TempSrc: Tympanic  SpO2: 99%  Weight: 161 lb 9.6 oz (73.3 kg)  Height: 5' 3 (1.6 m)   Physical Exam Cardiovascular:     Rate and Rhythm: Normal rate and regular rhythm.     Heart sounds: Normal heart sounds.  Pulmonary:     Effort: Pulmonary effort is normal.     Breath sounds: Normal breath sounds.  Abdominal:     General: Bowel sounds are normal.     Palpations: Abdomen is soft.  Skin:    General: Skin is warm and dry.  Neurological:     Mental Status: She is alert  and oriented to person, place, and time.      I have personally reviewed labs listed below:    Latest Ref Rng & Units 09/04/2024    8:25 AM  CMP  Glucose 70 - 99 mg/dL 876   BUN 6 - 20 mg/dL <5   Creatinine 9.55 - 1.00 mg/dL 9.64   Sodium 864 - 854 mmol/L 137   Potassium 3.5 - 5.1 mmol/L 3.5   Chloride 98 - 111 mmol/L 107   CO2 22 - 32 mmol/L 22   Calcium  8.9 - 10.3 mg/dL 9.9   Total Protein 6.5 - 8.1 g/dL 5.9   Total Bilirubin 0.0 - 1.2 mg/dL 1.0   Alkaline Phos 38 - 126 U/L 115   AST 15 - 41 U/L 38   ALT 0 - 44 U/L 29       Latest Ref Rng & Units 09/04/2024    8:25 AM  CBC  WBC 4.0 - 10.5 K/uL 4.3   Hemoglobin 12.0 - 15.0 g/dL 88.1   Hematocrit 63.9 - 46.0 % 36.3   Platelets 150 - 400 K/uL 143    I have personally reviewed Radiology images listed below: No images are attached to the encounter.  CT CHEST ABDOMEN PELVIS W CONTRAST Result Date: 08/22/2024 CLINICAL DATA:  Metastatic colon cancer.  * Tracking Code: BO * EXAM: CT CHEST, ABDOMEN, AND PELVIS WITH CONTRAST TECHNIQUE: Multidetector CT imaging of the chest, abdomen and pelvis was performed following the standard protocol during bolus administration of intravenous contrast. RADIATION DOSE REDUCTION: This exam was performed according to the departmental dose-optimization program which includes automated exposure control, adjustment of the mA  and/or kV according to patient size and/or use of iterative reconstruction technique. CONTRAST:  OMNIPAQUE  IOHEXOL  300 MG/ML  SOLN COMPARISON:  Nuclear medicine PET dated 05/16/2024, MRI abdomen dated 05/04/2024 FINDINGS: CT CHEST FINDINGS Cardiovascular: Right chest wall port terminates in the lower SVC. Normal heart size. No significant pericardial fluid/thickening. Great vessels are normal in course and caliber. No central pulmonary emboli. Mediastinum/Nodes: Imaged thyroid  gland without nodules meeting criteria for imaging follow-up by size. Normal esophagus. No pathologically  enlarged axillary, supraclavicular, mediastinal, or hilar lymph nodes. Lungs/Pleura: The central airways are patent. Unchanged scattered bilateral pulmonary nodules measuring up to 5 x 3 mm in the anterior left upper lobe (4:26), perifissural right upper lobe (4:80), right lower lobe (4:93), and left lower lobe (4:94, 98, 102). No focal consolidation. No pneumothorax. No pleural effusion. Musculoskeletal: No acute or abnormal lytic or blastic osseous lesions. CT ABDOMEN PELVIS FINDINGS Hepatobiliary: Interval decreased size of right hepatic masses, 6.3 x 5.1 cm segment 7 (2:46), previously 8.7 x 8.2 cm with posteromedial area of enhancement and 8.4 x 6.0 cm segment 5/6 (2:60), previously 12.1 x 8.4 cm. A small metastatic focus along the anterosuperior aspect of the segment 7 lesion measures 1.3 x 1.1 cm (2:41), previously 2.8 x 2.4 cm. Additional scattered hypodensities in keeping with cysts on prior MRI. No intra or extrahepatic biliary ductal dilation. Normal gallbladder. Pancreas: No focal lesions or main ductal dilation. Spleen: Normal in size without focal abnormality. Adrenals/Urinary Tract: No adrenal nodules. No suspicious renal mass, calculi, or hydronephrosis. No focal bladder wall thickening. Stomach/Bowel: Normal appearance of the stomach. Interval improvement previously noted sigmoid colonic mural thickening. No abnormal bowel dilation. Normal appendix. Vascular/Lymphatic: Aortic atherosclerosis. Unchanged superior rectal lymph node measuring 8 mm (2:84). Decreased conspicuity of adjacent lymph nodes in this chain. Reproductive: No adnexal masses. Other: No free fluid, fluid collection, or free air. Musculoskeletal: No acute or abnormal lytic or blastic osseous findings. IMPRESSION: 1. Interval decreased size of right hepatic masses and decreased conspicuity of superior rectal lymph nodes. 2. Interval improved sigmoid colonic mural thickening. 3. Unchanged scattered bilateral pulmonary nodules,  indeterminate. 4. No new or progressive metastatic disease in the chest, abdomen, or pelvis. 5.  Aortic Atherosclerosis (ICD10-I70.0). Electronically Signed   By: Limin  Xu M.D.   On: 08/22/2024 12:31     Assessment and plan- Patient is a 63 y.o. female with history of metastatic colon cancer with liver metastases wild-type RAS.  She is here for on treatment assessment prior to cycle 8 of FOLFOX panitumumab  chemotherapy  Counts okay to proceed with cycle 8 of FOLFOX Chemotherapy today.  I will see her back in 2 weeks for cycle 9.  I do plan to rechallenge her with oxaliplatin  but will do it over 3 hours and add additional premeds including Pepcid  and Benadryl .  I am also reducing the dose of oxaliplatin  to 65 mg/m.  CEA overall is coming down.  She will get growth factor support with each cycle  Patient has met with pancreaticobiliary surgery Dr. Romero at [facility].  He is considering definitive resection for her hepatic metastases and he will also discuss her case at tumor board.  If she does proceed with that she would likely undergo resection of her primary sigmoid cancer as well at the same time.  I am awaiting further input from him but in the meanwhile I am continuing with treatment above as planned.  Continue doxycycline  prophylaxis for panitumumab  induced skin rash   Visit Diagnosis 1. Colon  cancer metastasized to liver (HCC)   2. Encounter for antineoplastic chemotherapy   3. Encounter for monoclonal antibody treatment for malignancy      Dr. Annah Skene, MD, MPH [facility] [phone] 09/04/2024 11:23 AM

## 2024-09-04 NOTE — Progress Notes (Signed)
 Patient states she's feeling good today and did have a conversation regarding surgery.

## 2024-09-05 ENCOUNTER — Other Ambulatory Visit

## 2024-09-05 ENCOUNTER — Ambulatory Visit: Admitting: Oncology

## 2024-09-05 ENCOUNTER — Other Ambulatory Visit: Payer: Self-pay

## 2024-09-05 ENCOUNTER — Other Ambulatory Visit: Payer: Self-pay | Admitting: Hospice and Palliative Medicine

## 2024-09-05 ENCOUNTER — Ambulatory Visit

## 2024-09-05 ENCOUNTER — Other Ambulatory Visit: Payer: Self-pay | Admitting: Oncology

## 2024-09-05 DIAGNOSIS — C787 Secondary malignant neoplasm of liver and intrahepatic bile duct: Secondary | ICD-10-CM | POA: Diagnosis not present

## 2024-09-05 DIAGNOSIS — C189 Malignant neoplasm of colon, unspecified: Secondary | ICD-10-CM | POA: Diagnosis not present

## 2024-09-05 DIAGNOSIS — Z9189 Other specified personal risk factors, not elsewhere classified: Secondary | ICD-10-CM | POA: Diagnosis not present

## 2024-09-05 LAB — CEA: CEA: 5.9 ng/mL — ABNORMAL HIGH (ref 0.0–4.7)

## 2024-09-06 ENCOUNTER — Other Ambulatory Visit: Payer: Self-pay

## 2024-09-06 ENCOUNTER — Other Ambulatory Visit: Payer: Self-pay | Admitting: Oncology

## 2024-09-06 ENCOUNTER — Inpatient Hospital Stay

## 2024-09-06 VITALS — BP 144/82 | HR 72 | Temp 96.0°F | Resp 19

## 2024-09-06 DIAGNOSIS — Z5112 Encounter for antineoplastic immunotherapy: Secondary | ICD-10-CM | POA: Diagnosis not present

## 2024-09-06 DIAGNOSIS — C787 Secondary malignant neoplasm of liver and intrahepatic bile duct: Secondary | ICD-10-CM

## 2024-09-06 MED ORDER — LACTULOSE 10 GM/15ML PO SOLN
10.0000 g | Freq: Two times a day (BID) | ORAL | 0 refills | Status: DC | PRN
  Filled 2024-09-06: qty 236, 4d supply, fill #0

## 2024-09-06 MED ORDER — PEGFILGRASTIM-JMDB 6 MG/0.6ML ~~LOC~~ SOSY
6.0000 mg | PREFILLED_SYRINGE | Freq: Once | SUBCUTANEOUS | Status: AC
  Administered 2024-09-06: 6 mg via SUBCUTANEOUS
  Filled 2024-09-06: qty 0.6

## 2024-09-06 NOTE — Progress Notes (Signed)
 Telephone Note  Reason for Call:   Results of [facility] tumor board & change of plan  Ms. Heeg was called and we discussed the consensus from the [facility] recommendations from this morning was to proceed with right hepatectomy with addition of adjuvant HAI placement followed by 2-4 months of systemic + FUDR via pump. We would then re-assess her primary to evaluate appropriateness of resection. She agreed to OR date of 10/17/2024. We discussed need for PASS eval, visit with medical oncology, +/- CT cirrhosis cap and clinic visit with us  for final OR discussion. We would like to see her 10/17 or 10/24. She confirms her last chemo was yesterday, 9/16.   As we did not do in depth discussion of pump placement, today we reviewed HAIP therapy and requirements reviewed: she would need to establish/ switch her care with a Insurance account manager, future systemic +/- HAIP (FUDR) would be every 2 weeks done at [facility]. After treatment is completed, HAIP would require a glycerin fill around every 8 weeks, which are also completed at [facility]. HAI pump would remain indefinitely.   We discussed important activities to avoid with pump: No alcohol, tylenol, heating pads/hot packs over pump implant site, saunas, hot tubs or long sun exposure, rough physical activity (contact sports), deep sea/scuba diving, flying in un-pressurized airplanes and long trips to high altitude locations.  We will send her further HAIP information via mychart as well.  Questions/concerns answered.  Plan: - RTC 10/17 or 10/24 for preop appts, labs, +/-CT cirrhosis cap, PASS, eval with Med Onc and f/u with us  to review surgery more in depth. - No further chemo starting today.  Bruno Panther, MSN, NP-C Nurse Practitioner, [facility]  This telephone encounter was conducted with the patient's (or proxy's) verbal consent via secure, interactive audio telecommunications while in clinic/office/hospital.  The patient  (or proxy) was instructed to have this encounter in a suitably private space and to only have persons present to whom they give permission to participate. In addition, the patient identity was confirmed by use of name plus an additional identifier.  I spent 20 minutes with patient and/or family on this phone visit today.  This visit was coded based on medical decision making (MDM).

## 2024-09-07 ENCOUNTER — Encounter

## 2024-09-12 ENCOUNTER — Encounter: Payer: Self-pay | Admitting: Oncology

## 2024-09-14 DIAGNOSIS — C189 Malignant neoplasm of colon, unspecified: Secondary | ICD-10-CM | POA: Diagnosis not present

## 2024-09-14 DIAGNOSIS — Z01818 Encounter for other preprocedural examination: Secondary | ICD-10-CM | POA: Diagnosis not present

## 2024-09-14 DIAGNOSIS — C787 Secondary malignant neoplasm of liver and intrahepatic bile duct: Secondary | ICD-10-CM | POA: Diagnosis not present

## 2024-09-14 DIAGNOSIS — R918 Other nonspecific abnormal finding of lung field: Secondary | ICD-10-CM | POA: Diagnosis not present

## 2024-09-14 DIAGNOSIS — I82890 Acute embolism and thrombosis of other specified veins: Secondary | ICD-10-CM | POA: Diagnosis not present

## 2024-09-14 DIAGNOSIS — C19 Malignant neoplasm of rectosigmoid junction: Secondary | ICD-10-CM | POA: Diagnosis not present

## 2024-09-17 ENCOUNTER — Other Ambulatory Visit: Payer: Self-pay | Admitting: Oncology

## 2024-09-18 ENCOUNTER — Encounter: Payer: Self-pay | Admitting: Oncology

## 2024-09-18 ENCOUNTER — Other Ambulatory Visit: Payer: Self-pay

## 2024-09-18 ENCOUNTER — Inpatient Hospital Stay

## 2024-09-18 ENCOUNTER — Ambulatory Visit

## 2024-09-18 ENCOUNTER — Inpatient Hospital Stay: Admitting: Oncology

## 2024-09-18 MED ORDER — PANTOPRAZOLE SODIUM 20 MG PO TBEC
20.0000 mg | DELAYED_RELEASE_TABLET | Freq: Every day | ORAL | 3 refills | Status: DC
  Filled 2024-09-18: qty 90, 90d supply, fill #0

## 2024-09-18 NOTE — Progress Notes (Signed)
 Discharge Plan Member has been discharged from [facility]. Please see below Discharge Plan for details. The Member has been provided a copy of their Discharge Plan via the [facility] Care portal. If you have any questions, please contact our Clinical Team at [phone].  Name: Carla Mckinney Date of Birth: [birth year] Discharge Date: 09/18/2024 Discharged Reason: Not Engaged Referring Provider: Fonda Mower Current Psychiatric Medications: N/A Psychiatric Medication Recommendation Transition N/A (no CC med recs made)  Discharge Summary Care Delivered: Member did not attend any visit Interim evaluation: none completed as member did not attend visit Current status: Reason for discharge: Member has not responded to outreach Additional details: none Should referring organization contact?: member has not responded to outreach [facility] remains available should patient express interest in the future.  [place] Care Provider: Rosaline [place] Health Care Manager

## 2024-09-20 ENCOUNTER — Encounter

## 2024-09-20 DIAGNOSIS — C787 Secondary malignant neoplasm of liver and intrahepatic bile duct: Secondary | ICD-10-CM | POA: Diagnosis not present

## 2024-09-20 DIAGNOSIS — R590 Localized enlarged lymph nodes: Secondary | ICD-10-CM | POA: Diagnosis not present

## 2024-09-20 DIAGNOSIS — C19 Malignant neoplasm of rectosigmoid junction: Secondary | ICD-10-CM | POA: Diagnosis not present

## 2024-09-20 DIAGNOSIS — C189 Malignant neoplasm of colon, unspecified: Secondary | ICD-10-CM | POA: Diagnosis not present

## 2024-09-20 DIAGNOSIS — Z8505 Personal history of malignant neoplasm of liver: Secondary | ICD-10-CM | POA: Diagnosis not present

## 2024-09-22 DIAGNOSIS — C787 Secondary malignant neoplasm of liver and intrahepatic bile duct: Secondary | ICD-10-CM | POA: Diagnosis not present

## 2024-09-22 DIAGNOSIS — C189 Malignant neoplasm of colon, unspecified: Secondary | ICD-10-CM | POA: Diagnosis not present

## 2024-09-22 DIAGNOSIS — Z9189 Other specified personal risk factors, not elsewhere classified: Secondary | ICD-10-CM | POA: Diagnosis not present

## 2024-10-02 ENCOUNTER — Ambulatory Visit: Admitting: Oncology

## 2024-10-02 ENCOUNTER — Other Ambulatory Visit

## 2024-10-02 ENCOUNTER — Ambulatory Visit

## 2024-10-04 ENCOUNTER — Encounter

## 2024-10-13 ENCOUNTER — Other Ambulatory Visit: Payer: Self-pay

## 2024-10-17 DIAGNOSIS — G8918 Other acute postprocedural pain: Secondary | ICD-10-CM | POA: Diagnosis not present

## 2024-10-17 DIAGNOSIS — C787 Secondary malignant neoplasm of liver and intrahepatic bile duct: Secondary | ICD-10-CM | POA: Diagnosis not present

## 2024-10-17 DIAGNOSIS — C19 Malignant neoplasm of rectosigmoid junction: Secondary | ICD-10-CM | POA: Diagnosis not present

## 2024-10-17 DIAGNOSIS — E785 Hyperlipidemia, unspecified: Secondary | ICD-10-CM | POA: Diagnosis not present

## 2024-10-17 DIAGNOSIS — K7689 Other specified diseases of liver: Secondary | ICD-10-CM | POA: Diagnosis not present

## 2024-10-17 DIAGNOSIS — I4891 Unspecified atrial fibrillation: Secondary | ICD-10-CM | POA: Diagnosis not present

## 2024-10-17 DIAGNOSIS — J9811 Atelectasis: Secondary | ICD-10-CM | POA: Diagnosis not present

## 2024-10-17 DIAGNOSIS — J986 Disorders of diaphragm: Secondary | ICD-10-CM | POA: Diagnosis not present

## 2024-10-17 DIAGNOSIS — J90 Pleural effusion, not elsewhere classified: Secondary | ICD-10-CM | POA: Diagnosis not present

## 2024-10-17 DIAGNOSIS — Z90710 Acquired absence of both cervix and uterus: Secondary | ICD-10-CM | POA: Diagnosis not present

## 2024-10-17 DIAGNOSIS — Z5331 Laparoscopic surgical procedure converted to open procedure: Secondary | ICD-10-CM | POA: Diagnosis not present

## 2024-10-17 DIAGNOSIS — C189 Malignant neoplasm of colon, unspecified: Secondary | ICD-10-CM | POA: Diagnosis not present

## 2024-10-17 DIAGNOSIS — I10 Essential (primary) hypertension: Secondary | ICD-10-CM | POA: Diagnosis not present

## 2024-10-17 DIAGNOSIS — Z9221 Personal history of antineoplastic chemotherapy: Secondary | ICD-10-CM | POA: Diagnosis not present

## 2024-10-17 DIAGNOSIS — Z79899 Other long term (current) drug therapy: Secondary | ICD-10-CM | POA: Diagnosis not present

## 2024-10-17 DIAGNOSIS — K219 Gastro-esophageal reflux disease without esophagitis: Secondary | ICD-10-CM | POA: Diagnosis not present

## 2024-10-17 DIAGNOSIS — Z888 Allergy status to other drugs, medicaments and biological substances status: Secondary | ICD-10-CM | POA: Diagnosis not present

## 2024-10-18 DIAGNOSIS — J986 Disorders of diaphragm: Secondary | ICD-10-CM | POA: Diagnosis not present

## 2024-10-18 DIAGNOSIS — J9811 Atelectasis: Secondary | ICD-10-CM | POA: Diagnosis not present

## 2024-10-18 DIAGNOSIS — C787 Secondary malignant neoplasm of liver and intrahepatic bile duct: Secondary | ICD-10-CM | POA: Diagnosis not present

## 2024-10-18 DIAGNOSIS — C189 Malignant neoplasm of colon, unspecified: Secondary | ICD-10-CM | POA: Diagnosis not present

## 2024-10-18 DIAGNOSIS — J90 Pleural effusion, not elsewhere classified: Secondary | ICD-10-CM | POA: Diagnosis not present

## 2024-10-19 DIAGNOSIS — G8918 Other acute postprocedural pain: Secondary | ICD-10-CM | POA: Diagnosis not present

## 2024-10-20 ENCOUNTER — Encounter: Payer: Self-pay | Admitting: Oncology

## 2024-10-20 DIAGNOSIS — C787 Secondary malignant neoplasm of liver and intrahepatic bile duct: Secondary | ICD-10-CM | POA: Diagnosis not present

## 2024-10-20 DIAGNOSIS — G8918 Other acute postprocedural pain: Secondary | ICD-10-CM | POA: Diagnosis not present

## 2024-10-20 DIAGNOSIS — C19 Malignant neoplasm of rectosigmoid junction: Secondary | ICD-10-CM | POA: Diagnosis not present

## 2024-10-21 DIAGNOSIS — G8918 Other acute postprocedural pain: Secondary | ICD-10-CM | POA: Diagnosis not present

## 2024-10-22 ENCOUNTER — Encounter: Payer: Self-pay | Admitting: Oncology

## 2024-10-22 ENCOUNTER — Other Ambulatory Visit: Payer: Self-pay

## 2024-10-22 MED ORDER — ONDANSETRON HCL 4 MG PO TABS
4.0000 mg | ORAL_TABLET | Freq: Three times a day (TID) | ORAL | 0 refills | Status: AC
  Filled 2024-10-22: qty 20, 7d supply, fill #0

## 2024-10-22 MED ORDER — OXYCODONE HCL 5 MG PO TABS
5.0000 mg | ORAL_TABLET | ORAL | 0 refills | Status: DC
  Filled 2024-10-22: qty 10, 2d supply, fill #0

## 2024-10-22 MED ORDER — ENOXAPARIN SODIUM 40 MG/0.4ML IJ SOSY
40.0000 mg | PREFILLED_SYRINGE | Freq: Every day | INTRAMUSCULAR | 0 refills | Status: AC
  Filled 2024-10-22: qty 9.2, 23d supply, fill #0

## 2024-10-22 MED ORDER — PANTOPRAZOLE SODIUM 40 MG PO TBEC
40.0000 mg | DELAYED_RELEASE_TABLET | Freq: Every day | ORAL | 0 refills | Status: DC
  Filled 2024-10-22: qty 30, 30d supply, fill #0

## 2024-10-22 MED ORDER — ACETAMINOPHEN 325 MG PO TABS
325.0000 mg | ORAL_TABLET | Freq: Four times a day (QID) | ORAL | 0 refills | Status: AC
  Filled 2024-10-22: qty 30, 8d supply, fill #0

## 2024-10-22 NOTE — Discharge Summary (Signed)
 General Surgery Discharge Summary   PATIENT: Carla Mckinney DATE OF ADMISSION:  10/17/2024 DATE OF DISCHARGE: 10/22/2024 ATTENDING: Romero Toribio Mungo, MD Primary Care Physician: Carla Verneita Kay, MD Consulting Physicians: IP CONSULT TO VASCULAR ACCESS TEAM IP CONSULT TO INTERVENTIONAL RADIOLOGY [facility] IP CASE MANAGEMENT - CONSULT TO CARE COORDINATION SPECIALIST  ADMISSION DIAGNOSIS: Metastatic colorectal cancer (CMS/HHS-HCC) [C19]  Discharge Diagnoses:  Active Problems:   Colon cancer (CMS/HHS-HCC) Resolved Problems:   * No resolved hospital problems. *    PROCEDURES:  Procedure(s): *HAI* HEPATECTOMY, RESECTION OF LIVER; PARTIAL LOBECTOMY LAPAROSCOPY, SURGICAL; ABDOMEN, PERITONEUM, AND OMENTUM, DIAGNOSTIC, WITH OR WITHOUT COLLECTION OF SPECIMEN(S) BY BRUSHING OR WASHING (SEPARATE PROCEDURE) CHOLECYSTECTOMY ABDOMINAL LYMPHADENECTOMY, REGIONAL, INCLUDING CELIAC, GASTRIC, PORTAL, PERIPANCREATIC, WITH OR WITHOUT PARA-AORTIC AND VENA CAVAL NODES (LIST IN ADDITION TO PRIMARY PROCEDURE) INSERTION OF IMPLANTABLE INTRA-ARTERIAL INFUSION PUMP (EG, FOR CHEMOTHERAPY OF LIVER)    HISTORY OF PRESENT ILLNESS:  Carla Mckinney presents with metastatic colon cancer (invasive adenocarcinoma, MSI stable). Initially presented with unintentional weight loss and hematochezia. CT A/P (05/03/24) for RUQ pain revealed 12.5 cm right hepatic lobe lesion and 8.5 cm posterior right hepatic dome and a rectosigmoid colon mass with associated mesenteric lymphadenopathy. MRI pelvis (05/04/24) heterogeneously hypoenhancing masses occupying the majority of the right lobe of the liver measuring 14.9 x 12.9 x 9.1 cm. Incidental benign cyst throughout the liver. Given the left-sided tumor, bulky liver metastases and BRAF/NRAS wild-type, patient has been on FOLFOX panitumumab  chemotherapy locally since June 2025 (most recently received Cycle 8 on 09/04/24).  Initial CEA 305 (05/03/24).  PAST MEDICAL HISTORY: Past  Medical History:  Diagnosis Date  . Hypertension    PAST SURGICAL HISTORY: Past Surgical History:  Procedure Laterality Date  . RESECTION LIVER FOR PARTIAL LOBECTOMY N/A 10/17/2024   Procedure: *HAI* HEPATECTOMY, RESECTION OF LIVER; PARTIAL LOBECTOMY;  Surgeon: Carla Toribio Mungo, MD;  Location: [facility] OR;  Service: General Surgery;  Laterality: N/A;  . LAPAROSCOPY DIAGNOSTIC N/A 10/17/2024   Procedure: LAPAROSCOPY, SURGICAL; ABDOMEN, PERITONEUM, AND OMENTUM, DIAGNOSTIC, WITH OR WITHOUT COLLECTION OF SPECIMEN(S) BY BRUSHING OR WASHING (SEPARATE PROCEDURE);  Surgeon: Carla Toribio Mungo, MD;  Location: [facility] OR;  Service: General Surgery;  Laterality: N/A;  . CHOLECYSTECTOMY N/A 10/17/2024   Procedure: CHOLECYSTECTOMY;  Surgeon: Carla Toribio Mungo, MD;  Location: [facility] OR;  Service: General Surgery;  Laterality: N/A;  . ABDOMINAL LYMPHADENECTOMY N/A 10/17/2024   Procedure: ABDOMINAL LYMPHADENECTOMY, REGIONAL, INCLUDING CELIAC, GASTRIC, PORTAL, PERIPANCREATIC, WITH OR WITHOUT PARA-AORTIC AND VENA CAVAL NODES (LIST IN ADDITION TO PRIMARY PROCEDURE);  Surgeon: Carla Toribio Mungo, MD;  Location: [facility] OR;  Service: General Surgery;  Laterality: N/A;  . PLACEMENT INTRA ARTERIAL HEPATIC INFUSION PUMP N/A 10/17/2024   Procedure: INSERTION OF IMPLANTABLE INTRA-ARTERIAL INFUSION PUMP (EG, FOR CHEMOTHERAPY OF LIVER);  Surgeon: Carla Toribio Mungo, MD;  Location: [facility] OR;  Service: General Surgery;  Laterality: N/A;  . HYSTERECTOMY    . TONSILLECTOMY     FAMILY HISTORY: No family history on file. SOCIAL HISTORY: Social History   Socioeconomic History  . Marital status: Married  Tobacco Use  . Smoking status: Never  . Smokeless tobacco: Never  Vaping Use  . Vaping status: Never Used  Substance and Sexual Activity  . Alcohol use: Yes    Alcohol/week: 2.0 - 6.0 standard drinks of alcohol    Types: 2 - 6 Standard drinks or equivalent per week     Comment: Occasional   . Drug use: Never   Social Drivers  of Health   Financial Resource Strain: Low Risk  (10/20/2024)   Overall Financial Resource Strain (CARDIA)   . Difficulty of Paying Living Expenses: Not hard at all  Food Insecurity: No Food Insecurity (10/20/2024)   Hunger Vital Sign   . Worried About Programme Researcher, Broadcasting/film/video in the Last Year: Never true   . Ran Out of Food in the Last Year: Never true  Transportation Needs: Unknown (10/20/2024)   PRAPARE - Transportation   . Lack of Transportation (Medical): No   ALLERGIES: Allergies  Allergen Reactions  . Lisinopril  Swelling   MEDICATIONS ON ADMISSION: Prior to Admission Medications  Prescriptions Last Dose Taking?  FUROsemide  (LASIX ) 20 MG tablet Unknown No  Sig: Take 1 tablet by mouth once daily as needed  MAGIC MOUTHWASH ORAL SUSPENSION (MAGIC MOUTHWASH) Unknown No  Sig: Take 5 mLs by mouth 4 (four) times daily as needed for Pain  acetaminophen (TYLENOL ORAL) Past Month Yes  Sig: Take 1 tablet by mouth once daily as needed  aspirin 81 MG EC tablet  No  Sig: Take 81 mg by mouth once daily  clindamycin  (CLEOCIN  T) 1 % lotion Unknown No  Sig: Apply topically 2 (two) times daily as needed  dexAMETHasone  (DECADRON ) 4 MG tablet  No  Sig: Take 8 mg by mouth  diltiazem  (CARDIZEM ) 30 MG tablet  No  Sig: Take 1 tablet by mouth as needed  doxycycline  (VIBRAMYCIN ) 100 MG capsule  No  Sig: Take 100 mg by mouth once daily  escitalopram  oxalate (LEXAPRO ) 10 MG tablet Unknown No  Sig: Take 10 mg by mouth once daily as needed  hydrocortisone  1 % lotion  No  Sig: Apply topically 2 (two) times daily  lactulose  (ENULOSE ) 10 gram/15 mL oral solution Unknown No  Sig: Take 15-30 mLs by mouth 2 (two) times daily as needed  lidocaine -prilocaine  (EMLA ) cream Unknown No  Sig: Apply topically once as needed  metoprolol  succinate (TOPROL -XL) 100 MG XL tablet  Yes  Sig: Take 100 mg by mouth once daily.  ondansetron  (ZOFRAN ) 8 MG tablet  Unknown No  Sig: Take 8 mg by mouth every 8 (eight) hours as needed for Nausea or Vomiting  oxyCODONE  (ROXICODONE ) 5 MG immediate release tablet Unknown No  Sig: Take 5 mg by mouth every 4 (four) hours as needed  pantoprazole  (PROTONIX ) 20 MG DR tablet  Yes  Sig: Take 20 mg by mouth once daily  prochlorperazine  (COMPAZINE ) 10 MG tablet Unknown No  Sig: Take 10 mg by mouth every 6 (six) hours as needed for Nausea or Vomiting    Facility-Administered Medications: None   HOSPITAL COURSE: INELL MIMBS is a 64 y.o. female who was taken to the operating room on 10/17/2024 to undergo Procedure(s): *HAI* HEPATECTOMY, RESECTION OF LIVER; PARTIAL LOBECTOMY LAPAROSCOPY, SURGICAL; ABDOMEN, PERITONEUM, AND OMENTUM, DIAGNOSTIC, WITH OR WITHOUT COLLECTION OF SPECIMEN(S) BY BRUSHING OR WASHING (SEPARATE PROCEDURE) CHOLECYSTECTOMY ABDOMINAL LYMPHADENECTOMY, REGIONAL, INCLUDING CELIAC, GASTRIC, PORTAL, PERIPANCREATIC, WITH OR WITHOUT PARA-AORTIC AND VENA CAVAL NODES (LIST IN ADDITION TO PRIMARY PROCEDURE) INSERTION OF IMPLANTABLE INTRA-ARTERIAL INFUSION PUMP (EG, FOR CHEMOTHERAPY OF LIVER)  with Dr.Nussbaum. The details of the procedure can be found in the operative note. Overall, the patient tolerated the procedure well without significant difficulty or evident complication, and was awakened, extubated, and transferred to the PACU in stable condition for immediate recovery. She was later transferred to the surgical step-down unit for the remainder of the hospital course.   In the immediate postoperative period, she continued to  do well and there were no significant postoperative issues. She was closely monitored by serial physical exams and lab values. Out-of-bed activity was encouraged and diet was advanced as tolerated. Foley catheter was removed and the patient was able to spontaneously void without difficulty. Epidural was removed POD#4 and pain was well controlled with oral medication.   At the time  discharge planning was initiated, the patient was tolerating a PSB diet and pain was well controlled with oral medication. She had  resumed bowel and bladder function and was ambulating independently. All surgical incisions were healing well with intraoperative bandages removed and wound care instructions given to the patient and family.  Once follow-up arrangements were confirmed, the patient was afebrile with stable vital signs and was felt suitable for discharge on 11/2/205.   PHYSICAL EXAMINATION: General: appears well, alert, appearance consistent with stated age, no acute distress Cardiovascular: regular rate, hemodynamically stable Respiratory: nonlabored breathing on room air Abdomen: abdomen soft, nondistended, appropriate postoperative tenderness, no rebound or guarding, Incisions c/d/I, RUQ hockey stick incision  Extremities: warm and well perfused, no lower extremity edema Skin: no rashes or lesions  DISCHARGE MEDICATIONS:   Current Discharge Medication List     PAUSE taking these medications      Instructions  metoprolol  SUCCinate 100 MG XL tablet Wait to take this until your doctor or other care provider tells you to start again. Refills: 0  Commonly known as: TOPROL -XL Take 100 mg by mouth once daily.       START taking these medications      Instructions  docusate 100 MG capsule Refills: 0 Stop taking on: November 01, 2024  Commonly known as: COLACE Take 1 capsule (100 mg total) by mouth 2 (two) times daily as needed for Constipation for up to 10 days Last time this was given: 100 mg on October 21, 2024  9:17 AM   enoxaparin 40 mg/0.4 mL injection syringe Quantity: 23 each Refills: 0 Stop taking on: November 15, 2024  Commonly known as: LOVENOX Inject 0.4 mLs (40 mg total) subcutaneously once daily for 23 days in abdomen, rotating site with each injection. Last time this was given: 40 mg on October 22, 2024  7:43 AM   ondansetron  4 MG tablet Quantity: 20  tablet Refills: 0 Stop taking on: October 29, 2024  Commonly known as: ZOFRAN  Take 1 tablet (4 mg total) by mouth every 8 (eight) hours as needed for Nausea for up to 7 days   pantoprazole  40 MG DR tablet Quantity: 30 tablet Refills: 0  Commonly known as: PROTONIX  Take 1 tablet (40 mg total) by mouth once daily Last time this was given: 20 mg on October 22, 2024  7:43 AM   polyethylene glycol packet Refills: 0 Stop taking on: November 01, 2024  Commonly known as: MIRALAX Take 1 packet (17 g total) by mouth once daily as needed for Constipation for up to 10 days Mix in 4-8ounces of fluid prior to taking.       These medications have been CHANGED      Instructions  acetaminophen 325 MG tablet Quantity: 30 tablet Refills: 0 Stop taking on: November 01, 2024 What changed:  medication strength how much to take when to take this reasons to take this  Commonly known as: TYLENOL Take 2 tablets (650 mg total) by mouth every 6 (six) hours for 10 days Last time this was given: 650 mg on October 22, 2024 11:23 AM       CONTINUE  taking these medications      Instructions  clindamycin  1 % lotion Refills: 0  Commonly known as: CLEOCIN  T Apply topically 2 (two) times daily as needed   dexAMETHasone  4 MG tablet Refills: 0  Commonly known as: DECADRON  Take 8 mg by mouth Last time this was given: Ask your nurse or doctor   escitalopram  oxalate 10 MG tablet Refills: 0  Commonly known as: LEXAPRO  Take 10 mg by mouth once daily as needed   FUROsemide  20 MG tablet Refills: 0  Commonly known as: LASIX  Take 1 tablet by mouth once daily as needed   lactulose  10 gram/15 mL oral solution Refills: 0  Commonly known as: ENULOSE  Take 15-30 mLs by mouth 2 (two) times daily as needed   lidocaine -prilocaine  cream Refills: 0  Commonly known as: EMLA  Apply topically once as needed   MAGIC MOUTHWASH ORAL SUSPENSION Refills: 0  Commonly known as: MAGIC MOUTHWASH Take 5 mLs by  mouth 4 (four) times daily as needed for Pain   oxyCODONE  5 MG immediate release tablet Quantity: 10 tablet Refills: 0 Stop taking on: October 27, 2024  Commonly known as: ROXICODONE  Take 1 tablet (5 mg total) by mouth every 4 (four) hours as needed for up to 5 days Last time this was given: 5 mg on October 21, 2024  3:31 PM   prochlorperazine  10 MG tablet Refills: 0  Commonly known as: COMPAZINE  Take 10 mg by mouth every 6 (six) hours as needed for Nausea or Vomiting       STOP taking these medications    aspirin 81 MG EC tablet   doxycycline  100 MG capsule Commonly known as: VIBRAMYCIN    hydrocortisone  1 % lotion       DISCHARGE LABORATORIES (last 3 days): Recent Labs  Lab 10/20/24 0151 10/21/24 0144 10/22/24 0353  NA 135 131* 130*  K 3.7 3.9 3.5  CL 105 104 100  CO2 26 22 21   BUN 5* 3* 4*  CREATININE 0.5 0.4 0.4  GLUCOSE 88 90 100  CALCIUM  8.6* 8.5* 8.6*   Recent Labs  Lab 10/20/24 0151 10/21/24 0144 10/22/24 0353  WBC 6.5 5.0 4.6  HGB 8.7* 8.3* 8.5*  HCT 27.3* 26.0* 26.2*  PLT 108* 118* 126*   Recent Labs  Lab 10/20/24 0151 10/21/24 0731 10/22/24 0353  INR 1.5* 1.4* 1.3*   DISCHARGE INSTRUCTIONS: The patient was instructed to continue lovenox and Protonix   She was also instructed to continue with light activity restrictions.  She was informed to call the general surgery resident on call for any questions or concerns; specifically pain, fever, chills, wound redness or drainage, significant difficulty breathing or extremity swelling.  Discharge to home  Time spent on discharge: 45 minutes  Report Issues: Monday through Friday 8am-5pm call Triage RN: [phone]  After hours and weekends call: [phone], and ask for the Surgery resident on-call.  FOLLOWUP APPOINTMENTS: Future Appointments  Date Time Provider Department Center  11/03/2024  1:00 PM Lajoyce Bruno Helling, NP [facility]   AMIR MERRIANNE JERSEY,  MD 10/23/2024

## 2024-10-25 ENCOUNTER — Emergency Department
Admission: EM | Admit: 2024-10-25 | Discharge: 2024-10-25 | Disposition: A | Discharge status: Other Acute Inpatient Hospital | Attending: Emergency Medicine | Admitting: Emergency Medicine

## 2024-10-25 ENCOUNTER — Other Ambulatory Visit: Payer: Self-pay

## 2024-10-25 ENCOUNTER — Encounter: Payer: Self-pay | Admitting: Emergency Medicine

## 2024-10-25 ENCOUNTER — Emergency Department

## 2024-10-25 DIAGNOSIS — R918 Other nonspecific abnormal finding of lung field: Secondary | ICD-10-CM | POA: Diagnosis not present

## 2024-10-25 DIAGNOSIS — G8918 Other acute postprocedural pain: Secondary | ICD-10-CM | POA: Diagnosis not present

## 2024-10-25 DIAGNOSIS — R1011 Right upper quadrant pain: Secondary | ICD-10-CM | POA: Insufficient documentation

## 2024-10-25 DIAGNOSIS — R160 Hepatomegaly, not elsewhere classified: Secondary | ICD-10-CM | POA: Diagnosis not present

## 2024-10-25 DIAGNOSIS — R109 Unspecified abdominal pain: Secondary | ICD-10-CM

## 2024-10-25 DIAGNOSIS — Z743 Need for continuous supervision: Secondary | ICD-10-CM | POA: Diagnosis not present

## 2024-10-25 DIAGNOSIS — J9811 Atelectasis: Secondary | ICD-10-CM | POA: Diagnosis not present

## 2024-10-25 DIAGNOSIS — Z9889 Other specified postprocedural states: Secondary | ICD-10-CM | POA: Diagnosis not present

## 2024-10-25 DIAGNOSIS — R188 Other ascites: Secondary | ICD-10-CM | POA: Diagnosis not present

## 2024-10-25 DIAGNOSIS — J986 Disorders of diaphragm: Secondary | ICD-10-CM | POA: Diagnosis not present

## 2024-10-25 DIAGNOSIS — C787 Secondary malignant neoplasm of liver and intrahepatic bile duct: Secondary | ICD-10-CM | POA: Diagnosis not present

## 2024-10-25 DIAGNOSIS — R161 Splenomegaly, not elsewhere classified: Secondary | ICD-10-CM | POA: Diagnosis not present

## 2024-10-25 DIAGNOSIS — K9189 Other postprocedural complications and disorders of digestive system: Secondary | ICD-10-CM

## 2024-10-25 DIAGNOSIS — R1084 Generalized abdominal pain: Secondary | ICD-10-CM | POA: Diagnosis not present

## 2024-10-25 DIAGNOSIS — R140 Abdominal distension (gaseous): Secondary | ICD-10-CM | POA: Diagnosis not present

## 2024-10-25 DIAGNOSIS — G893 Neoplasm related pain (acute) (chronic): Secondary | ICD-10-CM | POA: Diagnosis not present

## 2024-10-25 DIAGNOSIS — K769 Liver disease, unspecified: Secondary | ICD-10-CM | POA: Diagnosis not present

## 2024-10-25 DIAGNOSIS — C189 Malignant neoplasm of colon, unspecified: Secondary | ICD-10-CM | POA: Diagnosis not present

## 2024-10-25 DIAGNOSIS — J90 Pleural effusion, not elsewhere classified: Secondary | ICD-10-CM | POA: Diagnosis not present

## 2024-10-25 LAB — URINALYSIS, ROUTINE W REFLEX MICROSCOPIC
Bilirubin Urine: NEGATIVE
Glucose, UA: NEGATIVE mg/dL
Hgb urine dipstick: NEGATIVE
Ketones, ur: 20 mg/dL — AB
Nitrite: NEGATIVE
Protein, ur: NEGATIVE mg/dL
Specific Gravity, Urine: 1.02 (ref 1.005–1.030)
pH: 5 (ref 5.0–8.0)

## 2024-10-25 LAB — CBC
HCT: 29.6 % — ABNORMAL LOW (ref 36.0–46.0)
Hemoglobin: 9.5 g/dL — ABNORMAL LOW (ref 12.0–15.0)
MCH: 30.8 pg (ref 26.0–34.0)
MCHC: 32.1 g/dL (ref 30.0–36.0)
MCV: 96.1 fL (ref 80.0–100.0)
Platelets: 228 K/uL (ref 150–400)
RBC: 3.08 MIL/uL — ABNORMAL LOW (ref 3.87–5.11)
RDW: 14.2 % (ref 11.5–15.5)
WBC: 8.3 K/uL (ref 4.0–10.5)
nRBC: 0 % (ref 0.0–0.2)

## 2024-10-25 LAB — COMPREHENSIVE METABOLIC PANEL WITH GFR
ALT: 64 U/L — ABNORMAL HIGH (ref 0–44)
AST: 48 U/L — ABNORMAL HIGH (ref 15–41)
Albumin: 3 g/dL — ABNORMAL LOW (ref 3.5–5.0)
Alkaline Phosphatase: 121 U/L (ref 38–126)
Anion gap: 13 (ref 5–15)
BUN: 7 mg/dL (ref 6–20)
CO2: 22 mmol/L (ref 22–32)
Calcium: 9.2 mg/dL (ref 8.9–10.3)
Chloride: 95 mmol/L — ABNORMAL LOW (ref 98–111)
Creatinine, Ser: 0.53 mg/dL (ref 0.44–1.00)
GFR, Estimated: >60 mL/min (ref >60)
Glucose, Bld: 112 mg/dL — ABNORMAL HIGH (ref 70–99)
Potassium: 3.4 mmol/L — ABNORMAL LOW (ref 3.5–5.1)
Sodium: 130 mmol/L — ABNORMAL LOW (ref 135–145)
Total Bilirubin: 1.3 mg/dL — ABNORMAL HIGH (ref 0.0–1.2)
Total Protein: 6.1 g/dL — ABNORMAL LOW (ref 6.5–8.1)

## 2024-10-25 LAB — LIPASE, BLOOD: Lipase: 45 U/L (ref 11–51)

## 2024-10-25 MED ORDER — SODIUM CHLORIDE 0.9 % IV BOLUS
1000.0000 mL | Freq: Once | INTRAVENOUS | Status: AC
  Administered 2024-10-25: 1000 mL via INTRAVENOUS

## 2024-10-25 MED ORDER — MORPHINE SULFATE (PF) 4 MG/ML IV SOLN
4.0000 mg | Freq: Once | INTRAVENOUS | Status: AC
  Administered 2024-10-25: 4 mg via INTRAVENOUS
  Filled 2024-10-25: qty 1

## 2024-10-25 MED ORDER — IOHEXOL 300 MG/ML  SOLN
100.0000 mL | Freq: Once | INTRAMUSCULAR | Status: AC | PRN
  Administered 2024-10-25: 100 mL via INTRAVENOUS

## 2024-10-25 MED ORDER — ONDANSETRON HCL 4 MG/2ML IJ SOLN
4.0000 mg | Freq: Once | INTRAMUSCULAR | Status: AC
Start: 2024-10-25 — End: 2024-10-25
  Administered 2024-10-25: 4 mg via INTRAVENOUS
  Filled 2024-10-25: qty 2

## 2024-10-25 MED ORDER — PIPERACILLIN-TAZOBACTAM 3.375 G IVPB 30 MIN
3.3750 g | Freq: Once | INTRAVENOUS | Status: AC
  Administered 2024-10-25: 3.375 g via INTRAVENOUS
  Filled 2024-10-25: qty 50

## 2024-10-25 NOTE — ED Notes (Signed)
 Pt called out because she felt feverish and that her stomach was swelling and that she needed some pain meds. This obtained there patients temperature. Pt temperature was 99.8, this tech let float RN know.

## 2024-10-25 NOTE — ED Provider Notes (Signed)
  Physical Exam  BP (!) 159/82   Pulse (!) 104   Temp 97.8 F (36.6 C) (Oral)   Resp 17   Ht 5' 2 (1.575 m)   Wt 72.1 kg   SpO2 100%   BMI 29.08 kg/m   Physical Exam  Procedures  Procedures  ED Course / MDM   Clinical Course as of 10/25/24 2306  Wed Oct 25, 2024  1534 S/o from Dr. Dorothyann - 52F hx met colon ca w/ large liver mets - s/p liver resection + lymph node resection - HAI pump in place, places chemo directly into liver - since going home on 11/3, progressively worsening pain - surgery done at [facility] - labs overall reassuring  TO DO: - f/u CT [MM]  1548 CTAP: IMPRESSION: 1. Indeterminate 3.4 x 2.4 cm complex low-density lesion in the lateral segment of the left hepatic lobe; differential includes metastasis, abscess, or infarct. 2. Mild splenomegaly. 3. Large postoperative fluid collection in the right upper quadrant surgical bed following partial right hepatic lobectomy and placement of a hepatic arterial infusion catheter. 4. Mild-to-moderate pelvic ascites.   [MM]  1624 Patient updated, remains well-appearing here with stable vital signs  Provided pager number for her surgeon at [facility] Dr. Romero  Will reach out to surgeon to discuss case [MM]  1732 Spoke w/ [facility] transfer center Accepted for ED to ED transfer, accepting Dr. Romero (pt's surgeon)  Request broad-spectrum antibiotic [MM]    Clinical Course User Index [MM] Sharifah Champine A, MD   Medical Decision Making Amount and/or Complexity of Data Reviewed Labs: ordered. Radiology: ordered.  Risk Prescription drug management.          Clarine Ozell LABOR, MD 10/25/24 2306

## 2024-10-25 NOTE — ED Notes (Signed)
 CCMD called to place pt in cardiac monitor per MD's order

## 2024-10-25 NOTE — ED Triage Notes (Signed)
 Patient to ED via POV for abd pain/swelling. Pt reports having a liver resection and HAI pump placed last week at [facility]. Denies V/D but having intermittent nausea.

## 2024-10-25 NOTE — ED Provider Notes (Signed)
 [facility] Provider Note    Event Date/Time   First MD Initiated Contact with Patient 10/25/24 1324     (approximate)  History   Chief Complaint: Abdominal Pain  HPI  Carla Mckinney is a 63 y.o. female with a past medical history of hypertension, hepatic cancer, presents to the emergency department for abdominal pain.  I reviewed the patient's [facility] chart where she was recently discharged 10/22/2024.  Patient has a history of metastatic colorectal cancer with metastases to the liver.  Patient had a hepatic resection within HAI pump placed (direct chemotherapy pump to the liver), cholecystectomy, abdominal lymphadenectomy.  Patient has had progressive worsening of pain since her discharge home 3 days ago.  Denies any acute worsening of pain.  She called her surgeon and they recommended to go to the local ER for workup and pain control.  Physical Exam   Triage Vital Signs: ED Triage Vitals [10/25/24 1239]  Encounter Vitals Group     BP (!) 159/82     Girls Systolic BP Percentile      Girls Diastolic BP Percentile      Boys Systolic BP Percentile      Boys Diastolic BP Percentile      Pulse Rate (!) 104     Resp 17     Temp 97.8 F (36.6 C)     Temp Source Oral     SpO2 100 %     Weight 159 lb (72.1 kg)     Height 5' 2 (1.575 m)     Head Circumference      Peak Flow      Pain Score 10     Pain Loc      Pain Education      Exclude from Growth Chart     Most recent vital signs: Vitals:   10/25/24 1239  BP: (!) 159/82  Pulse: (!) 104  Resp: 17  Temp: 97.8 F (36.6 C)  SpO2: 100%    General: Awake, no distress.  CV:  Good peripheral perfusion.  Regular rate and rhythm  Resp:  Normal effort.  Equal breath sounds bilaterally.  Abd:  No significant distention.  Patient has large right upper quadrant incision with staples in place, no signs of dehiscence.  She has a mid abdominal surgical incision covered with Dermabond again no dehiscence or  signs of obvious infection.  Patient has mild diffuse tenderness across the abdomen but no focal tenderness identified.   ED Results / Procedures / Treatments   RADIOLOGY  I have reviewed interpret the CT images.  Patient appears to have had a quite significant liver resection there is fluid in this area there is a moderate amount of free fluid throughout the abdomen.  I do not appreciate any obvious bowel obstruction.  Awaiting radiology read.   MEDICATIONS ORDERED IN ED: Medications  morphine (PF) 4 MG/ML injection 4 mg (4 mg Intravenous Given 10/25/24 1409)  ondansetron  (ZOFRAN ) injection 4 mg (4 mg Intravenous Given 10/25/24 1409)  sodium chloride  0.9 % bolus 1,000 mL (1,000 mLs Intravenous New Bag/Given 10/25/24 1414)     IMPRESSION / MDM / ASSESSMENT AND PLAN / ED COURSE  I reviewed the triage vital signs and the nursing notes.  Patient's presentation is most consistent with acute presentation with potential threat to life or bodily function.  Patient presents to the emergency department for worsening nominal discomfort.  Patient had a significant abdominal surgery for partial liver resection as well as  HAI pump placement, lymph adenectomy 10/17/2024.  Patient has significant incisions on the abdomen although none of which appear to be dehisced or show signs clinically of infection.  Patient's lab work today shows a reassuring CBC within normal white blood cell count, overall reassuring chemistry with just slight LFT elevation not unexpected given her recent liver resection.  Lipase negative.  Urinalysis does not show any obvious urinary tract infection given white cells and squamous cells with few bacteria given no urinary symptoms we will send urine culture but hold off on antibiotics.  CT scan of the abdomen and pelvis is pending radiology read.  Depending on the abdominal CT read consultation with the patient surgery may be necessary.  Patient has the phone number for the surgeon.   Patient is feeling better with pain medication in the emergency department.  Receiving IV fluids as well.  Patient care signed out to oncoming provider CT pending.  FINAL CLINICAL IMPRESSION(S) / ED DIAGNOSES   Abdominal pain Postoperative pain  Note:  This document was prepared using Dragon voice recognition software and may include unintentional dictation errors.   Dorothyann Drivers, MD 10/31/24 2240

## 2024-10-26 ENCOUNTER — Other Ambulatory Visit: Payer: Self-pay

## 2024-10-26 DIAGNOSIS — Z90710 Acquired absence of both cervix and uterus: Secondary | ICD-10-CM | POA: Diagnosis not present

## 2024-10-26 DIAGNOSIS — R918 Other nonspecific abnormal finding of lung field: Secondary | ICD-10-CM | POA: Diagnosis not present

## 2024-10-26 DIAGNOSIS — Z9049 Acquired absence of other specified parts of digestive tract: Secondary | ICD-10-CM | POA: Diagnosis not present

## 2024-10-26 DIAGNOSIS — J9811 Atelectasis: Secondary | ICD-10-CM | POA: Diagnosis not present

## 2024-10-26 DIAGNOSIS — Z85048 Personal history of other malignant neoplasm of rectum, rectosigmoid junction, and anus: Secondary | ICD-10-CM | POA: Diagnosis not present

## 2024-10-26 DIAGNOSIS — E213 Hyperparathyroidism, unspecified: Secondary | ICD-10-CM | POA: Diagnosis not present

## 2024-10-26 DIAGNOSIS — R140 Abdominal distension (gaseous): Secondary | ICD-10-CM | POA: Diagnosis not present

## 2024-10-26 DIAGNOSIS — R188 Other ascites: Secondary | ICD-10-CM | POA: Diagnosis not present

## 2024-10-26 DIAGNOSIS — C787 Secondary malignant neoplasm of liver and intrahepatic bile duct: Secondary | ICD-10-CM | POA: Diagnosis not present

## 2024-10-26 DIAGNOSIS — A419 Sepsis, unspecified organism: Secondary | ICD-10-CM | POA: Diagnosis not present

## 2024-10-26 DIAGNOSIS — I10 Essential (primary) hypertension: Secondary | ICD-10-CM | POA: Diagnosis not present

## 2024-10-26 DIAGNOSIS — Z79899 Other long term (current) drug therapy: Secondary | ICD-10-CM | POA: Diagnosis not present

## 2024-10-26 DIAGNOSIS — J90 Pleural effusion, not elsewhere classified: Secondary | ICD-10-CM | POA: Diagnosis not present

## 2024-10-26 DIAGNOSIS — Z9221 Personal history of antineoplastic chemotherapy: Secondary | ICD-10-CM | POA: Diagnosis not present

## 2024-10-26 DIAGNOSIS — I4891 Unspecified atrial fibrillation: Secondary | ICD-10-CM | POA: Diagnosis not present

## 2024-10-27 ENCOUNTER — Other Ambulatory Visit: Payer: Self-pay

## 2024-10-29 ENCOUNTER — Other Ambulatory Visit: Payer: Self-pay

## 2024-10-29 MED ORDER — FUROSEMIDE 20 MG PO TABS
80.0000 mg | ORAL_TABLET | Freq: Every day | ORAL | 0 refills | Status: DC
  Filled 2024-10-29: qty 28, 7d supply, fill #0

## 2024-10-29 MED ORDER — POTASSIUM CHLORIDE CRYS ER 20 MEQ PO TBCR
20.0000 meq | EXTENDED_RELEASE_TABLET | Freq: Two times a day (BID) | ORAL | 0 refills | Status: DC
  Filled 2024-10-29: qty 60, 30d supply, fill #0

## 2024-10-29 MED ORDER — MAGNESIUM CITRATE PO SOLN: 296.0000 mL | Freq: Once | ORAL | 0 refills | Status: AC

## 2024-10-29 MED ORDER — OXYCODONE HCL 5 MG PO TABS
5.0000 mg | ORAL_TABLET | ORAL | 0 refills | Status: AC | PRN
  Filled 2024-10-29: qty 10, 2d supply, fill #0

## 2024-11-01 ENCOUNTER — Other Ambulatory Visit: Payer: Self-pay

## 2024-11-01 ENCOUNTER — Ambulatory Visit: Payer: Self-pay

## 2024-11-01 NOTE — Telephone Encounter (Signed)
 FYI Only or Action Required?: Action required by provider: request for appointment.  Patient was last seen in primary care on 04/19/2024 by Marylynn Verneita CROME, MD.  Called Nurse Triage reporting Leg Swelling and Tachycardia.  Symptoms began several weeks ago.  Interventions attempted: Prescription medications: Lasix .  Symptoms are: stable.  Triage Disposition: See PCP Within 2 Weeks  Patient/caregiver understands and will follow disposition?: Yes Reason for Disposition  [1] MILD swelling of both ankles (i.e., pedal edema) AND [2] is a chronic symptom (recurrent or ongoing AND present > 4 weeks)  Answer Assessment - Initial Assessment Questions Patient's husband calling in today on high dose of Lasix  and needs to be monitored. Husband states HR has been running 100-102, hospital was not concerned but patient states she thinks her head is hurting because her heart rate is at 100. Patient seeing surgeon at [facility] on Friday. No available appointments for hospital follow up. Please advise  1. ONSET: When did the swelling start? (e.g., minutes, hours, days)     Surgery on 10/28, and has been swelling since 11/5 and was admitted. Mildly reduction in swelling but not much  2. LOCATION: What part of the leg is swollen?  Are both legs swollen or just one leg?     Abdomen, both legs from knees down to feet, mild swelling noted in thighs  3. SEVERITY: How bad is the swelling? (e.g., localized; mild, moderate, severe)     Moderate , came down a little from when was in hospital  4. REDNESS: Is there redness or signs of infection?     Denies  5. PAIN: Is the swelling painful to touch? If Yes, ask: How painful is it?   (Scale 1-10; mild, moderate or severe)     Abdomen is more uncomfortable than painful. Feels tight and swollen  6. FEVER: Do you have a fever? If Yes, ask: What is it, how was it measured, and when did it start?      100 yesterday and broke with tylenol   7. CAUSE:  What do you think is causing the leg swelling?     Liver retraction surgery  Protocols used: Leg Swelling and Edema-A-AH  Copied from CRM #[id number]. Topic: Clinical - Red Word Triage >> Nov 01, 2024  1:12 PM Roselie BROCKS wrote: [facility] that prompted transfer to Nurse Triage: Patients husband Emmalynn Pinkham) originally called for a hospital follow up ,discharged November 9th after Liver retraction surgery, But mentioned patients heart rate keeps rising,and she has pretty bad swelling in her abdomin and legs.

## 2024-11-02 ENCOUNTER — Encounter: Payer: Self-pay | Admitting: Nurse Practitioner

## 2024-11-02 ENCOUNTER — Ambulatory Visit: Admitting: Nurse Practitioner

## 2024-11-02 VITALS — BP 150/90 | HR 117 | Ht 62.0 in | Wt 181.2 lb

## 2024-11-02 DIAGNOSIS — R188 Other ascites: Secondary | ICD-10-CM | POA: Diagnosis not present

## 2024-11-02 DIAGNOSIS — R0600 Dyspnea, unspecified: Secondary | ICD-10-CM | POA: Diagnosis not present

## 2024-11-02 DIAGNOSIS — C787 Secondary malignant neoplasm of liver and intrahepatic bile duct: Secondary | ICD-10-CM | POA: Diagnosis not present

## 2024-11-02 DIAGNOSIS — E8770 Fluid overload, unspecified: Secondary | ICD-10-CM | POA: Diagnosis not present

## 2024-11-02 DIAGNOSIS — K659 Peritonitis, unspecified: Secondary | ICD-10-CM | POA: Diagnosis not present

## 2024-11-02 DIAGNOSIS — R109 Unspecified abdominal pain: Secondary | ICD-10-CM | POA: Diagnosis not present

## 2024-11-02 DIAGNOSIS — R9431 Abnormal electrocardiogram [ECG] [EKG]: Secondary | ICD-10-CM | POA: Insufficient documentation

## 2024-11-02 DIAGNOSIS — I10 Essential (primary) hypertension: Secondary | ICD-10-CM | POA: Diagnosis not present

## 2024-11-02 DIAGNOSIS — R0602 Shortness of breath: Secondary | ICD-10-CM | POA: Diagnosis not present

## 2024-11-02 DIAGNOSIS — C19 Malignant neoplasm of rectosigmoid junction: Secondary | ICD-10-CM | POA: Diagnosis not present

## 2024-11-02 DIAGNOSIS — J90 Pleural effusion, not elsewhere classified: Secondary | ICD-10-CM | POA: Diagnosis not present

## 2024-11-02 DIAGNOSIS — L7632 Postprocedural hematoma of skin and subcutaneous tissue following other procedure: Secondary | ICD-10-CM | POA: Diagnosis not present

## 2024-11-02 DIAGNOSIS — I3139 Other pericardial effusion (noninflammatory): Secondary | ICD-10-CM | POA: Diagnosis not present

## 2024-11-02 DIAGNOSIS — I4891 Unspecified atrial fibrillation: Secondary | ICD-10-CM | POA: Diagnosis not present

## 2024-11-02 DIAGNOSIS — R2242 Localized swelling, mass and lump, left lower limb: Secondary | ICD-10-CM | POA: Insufficient documentation

## 2024-11-02 DIAGNOSIS — I071 Rheumatic tricuspid insufficiency: Secondary | ICD-10-CM | POA: Diagnosis not present

## 2024-11-02 DIAGNOSIS — R180 Malignant ascites: Secondary | ICD-10-CM | POA: Diagnosis not present

## 2024-11-02 DIAGNOSIS — R918 Other nonspecific abnormal finding of lung field: Secondary | ICD-10-CM | POA: Diagnosis not present

## 2024-11-02 NOTE — Assessment & Plan Note (Addendum)
 Heart rate up to 117 bpm. Recent EKG shows new changes compared to the one from 10/25/24. Concerns about potential pulmonary embolism or pericardial effusion due to increased shortness of breath. - Referred to [facility] Emergency Department for further evaluation and management.

## 2024-11-02 NOTE — Assessment & Plan Note (Addendum)
 New onset shortness of breath post-surgery, with increased difficulty speaking and taking breaks to breathe. Differential includes pulmonary embolism and pericardial effusion. - Referred to [facility] Emergency Department for evaluation of shortness of breath and rule out pulmonary embolism or cardiac effusion.  Orders:   EKG 12-Lead

## 2024-11-02 NOTE — Progress Notes (Signed)
 Established Patient Office Visit  Subjective:  Patient ID: Carla Mckinney, female    DOB: Mar 24, 1961  Age: 63 y.o. MRN: [id number]  CC:  Chief Complaint  Patient presents with   Acute Visit    Bilateral lig swelling & tachycardia   Discussed the use of AI scribe software for clinical note transcription with the patient, who gave verbal consent to proceed.  History of Present Illness Discussed the use of AI scribe software for clinical note transcription with the patient, who gave verbal consent to proceed.  History of Present Illness   Carla Mckinney is a 63 year old female with colon cancer and liver metastasis who presents with abdominal swelling and shortness of breath.  She underwent liver surgery on October 17, 2024,Post-surgery, she experiences significant abdominal swelling and discomfort, described as feeling like an 'inner tube'. The swelling is painful, worsens with movement and breathing, and causes early satiety.  She was seen at [facility] ED on 10/25/24 due to worsening abdominal distension and peripheral edema. Despite being on Lasix  80 mg daily for four days, she has gained nearly 20 pounds, with persistent swelling in her abdomen and lower extremity.    She experiences shortness of breath, which began post-surgery and has worsened since hospital discharge on 10/29/24. It interferes with her ability to speak without pausing. Her heart rate remains elevated at 101-102 beats per minute. She was discharged from the hospital on Sunday and has a follow-up appointment tomorrow for staple removal.       Past Medical History:  Diagnosis Date   Hypertension    First started during pregnancy   Rheumatic fever 1986   has had echo since then. no cardiac issues.    Past Surgical History:  Procedure Laterality Date   ABDOMINAL HYSTERECTOMY     AUGMENTATION MAMMAPLASTY Bilateral    breast lift   CARDIAC CATHETERIZATION  2002   normal, due to syncope and abnormal myowiew  (Fath)   COLONOSCOPY WITH PROPOFOL  N/A 09/15/2017   Procedure: COLONOSCOPY WITH PROPOFOL ;  Surgeon: Unk Corinn Skiff, MD;  Location: [facility];  Service: Gastroenterology;  Laterality: N/A;   IR IMAGING GUIDED PORT INSERTION  05/11/2024   MASTOPEXY  2008   OOPHORECTOMY     secondary to scar tissue,  [facility]   RIGHT OOPHORECTOMY     TONSILLECTOMY     TUBAL LIGATION      Family History  Problem Relation Age of Onset   Heart disease Mother    Heart failure Mother    Hypertension Father    Hyperlipidemia Father    Heart disease Maternal Grandfather    Heart disease Paternal Grandmother    Stroke Paternal Grandmother 63   Cancer Paternal Grandfather        multiple myeloma   Breast cancer Neg Hx     Social History   Socioeconomic History   Marital status: Married    Spouse name: Not on file   Number of children: 2   Years of education: Not on file   Highest education level: Associate degree: academic program  Occupational History   Not on file  Tobacco Use   Smoking status: Never   Smokeless tobacco: Never  Vaping Use   Vaping status: Never Used  Substance and Sexual Activity   Alcohol use: Yes    Alcohol/week: 1.0 standard drink of alcohol    Types: 1 Glasses of wine per week    Comment: socially   Drug use: No  Sexual activity: Yes    Birth control/protection: None  Other Topics Concern   Not on file  Social History Narrative   Not on file   Social Drivers of Health   Financial Resource Strain: Low Risk  (10/26/2024)   Received from [facility]   Overall Financial Resource Strain (CARDIA)    Difficulty of Paying Living Expenses: Not hard at all  Food Insecurity: No Food Insecurity (10/26/2024)   Received from [facility]   Hunger Vital Sign    Within the past 12 months, you worried that your food would run out before you got the money to buy more.: Never true    Within the past 12 months, the food you bought just  didn't last and you didn't have money to get more.: Never true  Transportation Needs: No Transportation Needs (10/26/2024)   Received from [facility] - Transportation    In the past 12 months, has lack of transportation kept you from medical appointments or from getting medications?: No    Lack of Transportation (Non-Medical): No  Physical Activity: Insufficiently Active (05/05/2024)   Exercise Vital Sign    Days of Exercise per Week: 2 days    Minutes of Exercise per Session: 20 min  Stress: No Stress Concern Present (11/16/2023)   [employer] of Occupational Health - Occupational Stress Questionnaire    Feeling of Stress : Not at all  Social Connections: Moderately Integrated (11/16/2023)   Social Connection and Isolation Panel    Frequency of Communication with Friends and Family: More than three times a week    Frequency of Social Gatherings with Friends and Family: Twice a week    Attends Religious Services: More than 4 times per year    Active Member of [employer] or Organizations: No    Attends Banker Meetings: Not on file    Marital Status: Married  Catering Manager Violence: Not At Risk (05/05/2024)   Humiliation, Afraid, Rape, and Kick questionnaire    Fear of Current or Ex-Partner: No    Emotionally Abused: No    Physically Abused: No    Sexually Abused: No     Outpatient Medications Prior to Visit  Medication Sig Dispense Refill   acetaminophen (TYLENOL) 325 MG tablet Take 1 tablet (325 mg total) by mouth every 6 (six) hours. 30 tablet 0   enoxaparin (LOVENOX) 40 MG/0.4ML injection Inject 0.4 mLs (40 mg total) into the skin daily for 23 days. Rotate site with each injection. 9.2 mL 0   furosemide  (LASIX ) 20 MG tablet Take 4 tablets (80 mg total) by mouth daily for 7 days. 28 tablet 0   ondansetron  (ZOFRAN ) 4 MG tablet Take 1 tablet (4 mg total) by mouth every 8 (eight) hours as needed for nausea for up to 7 days. 20 tablet 0    oxyCODONE  (OXY IR/ROXICODONE ) 5 MG immediate release tablet Take 1 tablet (5 mg total) by mouth every 4 (four) hours as needed for severe pain (pain score 7-10). 120 tablet 0   oxyCODONE  (OXY IR/ROXICODONE ) 5 MG immediate release tablet Take 1 tablet (5 mg total) by mouth every 4 (four) hours as needed for pain for up to 5 days. 10 tablet 0   pantoprazole  (PROTONIX ) 40 MG tablet Take 1 tablet (40 mg total) by mouth daily. 30 tablet 0   potassium chloride SA (KLOR-CON M) 20 MEQ tablet Take 1 tablet (20 mEq total) by mouth 2 (two) times  daily 60 tablet 0   amLODipine  (NORVASC ) 10 MG tablet Take 1 tablet (10 mg total) by mouth daily. (Patient not taking: Reported on 11/02/2024) 90 tablet 1   cholecalciferol (VITAMIN D3) 25 MCG (1000 UNIT) tablet Take 1,000 Units by mouth daily. (Patient not taking: Reported on 11/02/2024)     clindamycin  (CLEOCIN  T) 1 % lotion Apply topically 2 (two) times daily. (Patient not taking: Reported on 11/02/2024) 60 mL 0   dexamethasone  (DECADRON ) 4 MG tablet Take 2 tablets (8 mg total) by mouth daily. Start the day after chemotherapy for 2 days. Take with food. (Patient not taking: Reported on 11/02/2024) 30 tablet 1   diltiazem  (CARDIZEM ) 30 MG tablet Take 1 tablet (30 mg total) by mouth as needed (rapid heart rate). (Patient not taking: Reported on 11/02/2024) 90 tablet 2   escitalopram  (LEXAPRO ) 10 MG tablet Take 1 tablet (10 mg total) by mouth daily. (Patient not taking: Reported on 11/02/2024) 30 tablet 0   hydrocortisone  1 % lotion Apply 1 Application topically 2 (two) times daily. (Patient not taking: Reported on 11/02/2024) 114 g 0   lactulose  (CHRONULAC ) 10 GM/15ML solution Take 15-30 mLs (10-20 g total) by mouth 2 (two) times daily as needed for mild constipation. (Patient not taking: Reported on 11/02/2024) 236 mL 0   lidocaine -prilocaine  (EMLA ) cream Apply to affected area once (Patient not taking: Reported on 11/02/2024) 30 g 3   magic mouthwash (multi-ingredient)  oral suspension Take 5 mLs by mouth 4 (four) times daily as needed for mouth pain. (Patient not taking: Reported on 11/02/2024) 280 mL 3   metoprolol  succinate (TOPROL -XL) 100 MG 24 hr tablet Take 1 tablet (100 mg total) by mouth daily. (Patient not taking: Reported on 11/02/2024) 90 tablet 1   prochlorperazine  (COMPAZINE ) 10 MG tablet Take 1 tablet (10 mg total) by mouth every 6 (six) hours as needed for nausea or vomiting. (Patient not taking: Reported on 11/02/2024) 30 tablet 1   acetaminophen (TYLENOL) 325 MG tablet Take 650 mg by mouth every 6 (six) hours as needed. (Patient not taking: Reported on 11/02/2024)     doxycycline  (VIBRAMYCIN ) 100 MG capsule Take 1 capsule (100 mg total) by mouth 2 (two) times daily. Take for 4 weeks (Patient not taking: Reported on 11/02/2024) 56 capsule 0   levofloxacin  (LEVAQUIN ) 750 MG tablet Take 1 tablet (750 mg total) by mouth daily. (Patient not taking: Reported on 11/02/2024) 7 tablet 0   ondansetron  (ZOFRAN ) 8 MG tablet Take 1 tablet (8 mg total) by mouth every 8 (eight) hours as needed for nausea or vomiting. Start on the third day after chemotherapy. (Patient not taking: Reported on 11/02/2024) 30 tablet 1   pantoprazole  (PROTONIX ) 20 MG tablet Take 1 tablet (20 mg total) by mouth daily. 30 tablet 3   Facility-Administered Medications Prior to Visit  Medication Dose Route Frequency Provider Last Rate Last Admin   sodium chloride  flush (NS) 0.9 % injection 10 mL  10 mL Intracatheter PRN Melanee Annah BROCKS, MD   10 mL at 07/26/24 1117    Allergies  Allergen Reactions   Oxaliplatin  Hives, Nausea And Vomiting and Other (See Comments)    Hip pain   Lisinopril  Swelling    ROS Review of Systems Negative unless indicated in HPI.    Objective:    Physical Exam Constitutional:      Appearance: Normal appearance.  Cardiovascular:     Rate and Rhythm: Tachycardia present.     Pulses: Normal pulses.  Heart sounds: Normal heart sounds.  Pulmonary:      Effort: Pulmonary effort is normal.     Breath sounds: Normal breath sounds. No stridor. No rales.  Abdominal:     General: There is distension.  Musculoskeletal:     Right lower leg: Edema present.     Left lower leg: Edema present.  Neurological:     General: No focal deficit present.     Mental Status: She is alert and oriented to person, place, and time.  Psychiatric:        Mood and Affect: Mood normal.     BP (!) 150/90   Pulse (!) 117   Ht 5' 2 (1.575 m)   Wt 181 lb 3.2 oz (82.2 kg)   SpO2 96%   BMI 33.14 kg/m  Wt Readings from Last 3 Encounters:  11/02/24 181 lb 3.2 oz (82.2 kg)  10/25/24 159 lb (72.1 kg)  09/04/24 161 lb 9.6 oz (73.3 kg)     Health Maintenance  Topic Date Due   Pneumococcal Vaccine: 50+ Years (1 of 2 - PCV) Never done   Zoster Vaccines- Shingrix (1 of 2) Never done   Mammogram  04/26/2024   COVID-19 Vaccine (4 - 2025-26 season) 11/18/2024 (Originally 08/21/2024)   DTaP/Tdap/Td (4 - Td or Tdap) 09/29/2032   Colonoscopy  05/09/2034   Influenza Vaccine  Completed   Hepatitis C Screening  Completed   HIV Screening  Completed   Hepatitis B Vaccines 19-59 Average Risk  Aged Out   HPV VACCINES  Aged Out   Meningococcal B Vaccine  Aged Out    There are no preventive care reminders to display for this patient.  Lab Results  Component Value Date   TSH 2.312 05/30/2024   Lab Results  Component Value Date   WBC 8.3 10/25/2024   HGB 9.5 (L) 10/25/2024   HCT 29.6 (L) 10/25/2024   MCV 96.1 10/25/2024   PLT 228 10/25/2024   Lab Results  Component Value Date   NA 130 (L) 10/25/2024   K 3.4 (L) 10/25/2024   CO2 22 10/25/2024   GLUCOSE 112 (H) 10/25/2024   BUN 7 10/25/2024   CREATININE 0.53 10/25/2024   BILITOT 1.3 (H) 10/25/2024   ALKPHOS 121 10/25/2024   AST 48 (H) 10/25/2024   ALT 64 (H) 10/25/2024   PROT 6.1 (L) 10/25/2024   ALBUMIN 3.0 (L) 10/25/2024   CALCIUM  9.2 10/25/2024   ANIONGAP 13 10/25/2024   GFR 92.44 04/20/2024   Lab  Results  Component Value Date   CHOL 280 (H) 11/16/2023   Lab Results  Component Value Date   HDL 65.60 11/16/2023   Lab Results  Component Value Date   LDLCALC 198 (H) 11/16/2023   Lab Results  Component Value Date   TRIG 82.0 11/16/2023   Lab Results  Component Value Date   CHOLHDL 4 11/16/2023   Lab Results  Component Value Date   HGBA1C 5.3 05/04/2022      Assessment & Plan:   Assessment & Plan Abnormal EKG Heart rate up to 117 bpm. Recent EKG shows new changes compared to the one from 10/25/24. Concerns about potential pulmonary embolism or pericardial effusion due to increased shortness of breath. - Referred to [facility] Emergency Department for further evaluation and management.     Dyspnea, unspecified type New onset shortness of breath post-surgery, with increased difficulty speaking and taking breaks to breathe. Differential includes pulmonary embolism and pericardial effusion. - Referred to [facility] Emergency Department  for evaluation of shortness of breath and rule out pulmonary embolism or cardiac effusion.  Orders:   EKG 12-Lead  Localized swelling of left lower extremity Significant weight gain since surgery. Currently on furosemide  80 mg daily with some reduction in swelling but persistent symptoms.      Assessment and Plan Assessment & Plan       Follow-up: No follow-ups on file.   Mandela Bello, NP

## 2024-11-02 NOTE — ED Provider Notes (Addendum)
 Initial Provider Assessment  Interpreter used: Level of Interpreter Services: No interpreter needed (no language barrier) Historian: patient and spouse/SO  HPI: Pt is a 63 y.o. female with history as listed below who presents to the ED with Shortness of Breath, per report at her PCP at [facility] today had abnormal ECG prompting referral to the Emergency Department.  Patient with recent surgical oncology procedure with Dr. Romero including diagnostic laparoscopy, resection liver for partial lobectomy, cholecystectomy, abdominal lymphadenectomy, placement intra-arterial hepatic infusion pump.  Past Medical History:  Diagnosis Date  . Hypertension     Past Surgical History:  Procedure Laterality Date  . RESECTION LIVER FOR PARTIAL LOBECTOMY N/A 10/17/2024   Procedure: *HAI* HEPATECTOMY, RESECTION OF LIVER; PARTIAL LOBECTOMY;  Surgeon: Romero Toribio Mungo, MD;  Location: [facility] OR;  Service: General Surgery;  Laterality: N/A;  . LAPAROSCOPY DIAGNOSTIC N/A 10/17/2024   Procedure: LAPAROSCOPY, SURGICAL; ABDOMEN, PERITONEUM, AND OMENTUM, DIAGNOSTIC, WITH OR WITHOUT COLLECTION OF SPECIMEN(S) BY BRUSHING OR WASHING (SEPARATE PROCEDURE);  Surgeon: Romero Toribio Mungo, MD;  Location: [facility] OR;  Service: General Surgery;  Laterality: N/A;  . CHOLECYSTECTOMY N/A 10/17/2024   Procedure: CHOLECYSTECTOMY;  Surgeon: Romero Toribio Mungo, MD;  Location: [facility] OR;  Service: General Surgery;  Laterality: N/A;  . ABDOMINAL LYMPHADENECTOMY N/A 10/17/2024   Procedure: ABDOMINAL LYMPHADENECTOMY, REGIONAL, INCLUDING CELIAC, GASTRIC, PORTAL, PERIPANCREATIC, WITH OR WITHOUT PARA-AORTIC AND VENA CAVAL NODES (LIST IN ADDITION TO PRIMARY PROCEDURE);  Surgeon: Romero Toribio Mungo, MD;  Location: [facility] OR;  Service: General Surgery;  Laterality: N/A;  . PLACEMENT INTRA ARTERIAL HEPATIC INFUSION PUMP N/A 10/17/2024   Procedure: INSERTION OF IMPLANTABLE INTRA-ARTERIAL INFUSION PUMP (EG,  FOR CHEMOTHERAPY OF LIVER);  Surgeon: Romero Toribio Mungo, MD;  Location: [facility] OR;  Service: General Surgery;  Laterality: N/A;  . HYSTERECTOMY    . TONSILLECTOMY       Vitals:   11/02/24 1435  BP: (!) 139/99  BP Location: Left upper arm  Patient Position: Sitting  Pulse: (!) 120  Resp: 18  Temp: 37 C (98.6 F)  TempSrc: Oral  SpO2: 98%   Focused Physical Exam: Gen: No Acute Distress HEENT: Normocephalic and atraumatic CV: Tachycardic  Lung: No respiratory distress Abd: distended  Neuro: Alert and awake, moving all extremities well  Medical Decision Making and Plan: Given the patient's initial provider assessment, the following diagnostic evaluation and therapeutic interventions have been ordered. The patient will be placed in the appropriate treatment space, once one is available, to complete the evaluation and treatment.  I have discussed the plan of care with the patient. Prior notes reviewed: yes Tests ordered:  Orders Placed This Encounter  Procedures  . CT chest PE protocol incl CT angiogram chest w wo contrast  . Complete Blood Count (CBC) with Differential  . Comprehensive Metabolic Panel (CMP)  . Prothrombin Time (INR)  . Activated Partial Thromboplastin Time (APTT)  . Troponin I, High Sensitivity ED Evaluation (hsTnI)  . Pro-Brain Natriuretic peptide, N-Terminal (NT-pro-BNP)  . Consult to General Surgery- ED Only  . ECG 12-lead  . ECG 12-lead  . ECG 12-lead  . Type And Screen  . Peripheral IV Access, Obtain & Maintain- Adult   Treatments ordered: Medications - No data to display  4:07 PM General Surgery is seeing patient in the triage room at this time. They request CT abdomen and pelvis.  4:19 PM General Surgery has seen. Request gentle IVF. Will follow up CT scans. Anticipate observing her overnight.  Cort Asberry Carson, MD 11/02/24 1531    Cort Asberry Carson, MD 11/02/24 1607    Cort Asberry Carson, MD 11/02/24  [phone]

## 2024-11-02 NOTE — Assessment & Plan Note (Signed)
 Significant weight gain since surgery. Currently on furosemide  80 mg daily with some reduction in swelling but persistent symptoms.

## 2024-11-03 ENCOUNTER — Other Ambulatory Visit: Payer: Self-pay

## 2024-11-07 ENCOUNTER — Other Ambulatory Visit: Payer: Self-pay

## 2024-11-09 ENCOUNTER — Other Ambulatory Visit: Payer: Self-pay

## 2024-11-10 ENCOUNTER — Other Ambulatory Visit: Payer: Self-pay

## 2024-11-10 ENCOUNTER — Telehealth: Payer: Self-pay

## 2024-11-10 DIAGNOSIS — K625 Hemorrhage of anus and rectum: Secondary | ICD-10-CM

## 2024-11-10 MED ORDER — SPIRONOLACTONE 100 MG PO TABS
100.0000 mg | ORAL_TABLET | Freq: Every day | ORAL | 0 refills | Status: DC
  Filled 2024-11-10: qty 14, 14d supply, fill #0

## 2024-11-10 MED ORDER — FUROSEMIDE 20 MG PO TABS
40.0000 mg | ORAL_TABLET | Freq: Every day | ORAL | 0 refills | Status: DC
  Filled 2024-11-10: qty 14, 7d supply, fill #0

## 2024-11-10 MED ORDER — OXYCODONE HCL 5 MG PO TABS
5.0000 mg | ORAL_TABLET | ORAL | 0 refills | Status: AC | PRN
  Filled 2024-11-10: qty 10, 2d supply, fill #0

## 2024-11-10 NOTE — Telephone Encounter (Signed)
 I noticed that an appointment opened up for Dr. Verneita Kettering on 11/13/2024 at 10am.  I spoke with patient and she is available to come in on that date/time, so I rescheduled her hospital follow-up with Dr. Kettering to 11/13/2024 at 10am.

## 2024-11-10 NOTE — Telephone Encounter (Signed)
 I have pended labs for your approval. If okay to have done here I will call pt and schedule her for a lab appt on Monday.

## 2024-11-10 NOTE — Telephone Encounter (Signed)
 Copied from CRM #[id number]. Topic: Clinical - Request for Lab/Test Order >> Nov 10, 2024  3:49 PM Thersia BROCKS wrote: Reason for RMF:[id number] called in stated she needs  CBC labs CMT labs done on Monday  Patient Provider Romero needs them

## 2024-11-10 NOTE — Telephone Encounter (Signed)
 FYI   Pt is scheduled for a hospital follow on 11/13/2024.

## 2024-11-10 NOTE — Addendum Note (Signed)
 Addended by: MARYLYNN VERNEITA CROME on: 11/10/2024 05:05 PM   Modules accepted: Orders

## 2024-11-10 NOTE — Addendum Note (Signed)
 Addended by: HARRIETTE RAISIN on: 11/10/2024 04:39 PM   Modules accepted: Orders

## 2024-11-10 NOTE — Telephone Encounter (Signed)
 Pt will have her labs done on Monday when she comes in to see Dr. Tullo

## 2024-11-10 NOTE — Telephone Encounter (Signed)
 Patient scheduled an appointment via MyChart for 11/27/2024 at 5pm with the following comment:  Patient comments: Follow up after surgery at [facility] to monitor fluids and BP  I spoke with patient and she states she is in the hospital right now at [facility], but is going to be discharged today.  Patient states she wants to follow-up with Dr. Verneita Kettering, but this is the earliest appointment she could find.  I moved patient to 11:00am on 11/27/2024, which is a slot designated for a hospital follow-up and added her to the wait list.  I let patient know that I will send a message to Dr. Kettering to let her know what she is seeing her for and where she is on her schedule.

## 2024-11-10 NOTE — Progress Notes (Signed)
 Case Manager Discharge Summary / Closing Note  Expected Discharge Date & Time: 11/10/2024 at   Discharge Plan:  Patient is discharging home with routine care and no post-acute services were indicated prior to discharge.    Post-Acute Services Coordinated: No resources indicated at this time  Funding for Discharge Medications: Drug assistance not needed    Transportation: Arrangements: patient arranged Discharge Transportation: private vehicle  Final ADT:  Final ADT Discharge Disposition: Home Based  Home Based: Home or Self Care                 Second IMM Received: Not indicated (11/10/24 9150)  Final Summary: CM spoke with service and completed chart review. Pt is medically stable for dc home today with no CM needs identified. 2nd IMM not indicated.  Carla Mckinney

## 2024-11-12 ENCOUNTER — Other Ambulatory Visit: Payer: Self-pay

## 2024-11-13 ENCOUNTER — Other Ambulatory Visit: Payer: Self-pay

## 2024-11-13 ENCOUNTER — Encounter: Payer: Self-pay | Admitting: Internal Medicine

## 2024-11-13 ENCOUNTER — Ambulatory Visit: Admitting: Internal Medicine

## 2024-11-13 VITALS — BP 150/94 | HR 110 | Ht 62.0 in | Wt 163.6 lb

## 2024-11-13 DIAGNOSIS — C787 Secondary malignant neoplasm of liver and intrahepatic bile duct: Secondary | ICD-10-CM

## 2024-11-13 DIAGNOSIS — Z09 Encounter for follow-up examination after completed treatment for conditions other than malignant neoplasm: Secondary | ICD-10-CM

## 2024-11-13 DIAGNOSIS — R197 Diarrhea, unspecified: Secondary | ICD-10-CM

## 2024-11-13 LAB — COMPREHENSIVE METABOLIC PANEL WITH GFR
ALT: 8 U/L (ref 0–35)
AST: 19 U/L (ref 0–37)
Albumin: 3.8 g/dL (ref 3.5–5.2)
Alkaline Phosphatase: 128 U/L — ABNORMAL HIGH (ref 39–117)
BUN: 5 mg/dL — ABNORMAL LOW (ref 6–23)
CO2: 29 meq/L (ref 19–32)
Calcium: 10.7 mg/dL — ABNORMAL HIGH (ref 8.4–10.5)
Chloride: 95 meq/L — ABNORMAL LOW (ref 96–112)
Creatinine, Ser: 0.53 mg/dL (ref 0.40–1.20)
GFR: 100.15 mL/min (ref >60.00)
Glucose, Bld: 110 mg/dL — ABNORMAL HIGH (ref 70–99)
Potassium: 4.8 meq/L (ref 3.5–5.1)
Sodium: 132 meq/L — ABNORMAL LOW (ref 135–145)
Total Bilirubin: 0.8 mg/dL (ref 0.2–1.2)
Total Protein: 6.4 g/dL (ref 6.0–8.3)

## 2024-11-13 LAB — CBC WITH DIFFERENTIAL/PLATELET
Basophils Absolute: 0.1 K/uL (ref 0.0–0.1)
Basophils Relative: 0.9 % (ref 0.0–3.0)
Eosinophils Absolute: 0 K/uL (ref 0.0–0.7)
Eosinophils Relative: 0.6 % (ref 0.0–5.0)
HCT: 31.9 % — ABNORMAL LOW (ref 36.0–46.0)
Hemoglobin: 10.4 g/dL — ABNORMAL LOW (ref 12.0–15.0)
Lymphocytes Relative: 12.6 % (ref 12.0–46.0)
Lymphs Abs: 0.9 K/uL (ref 0.7–4.0)
MCHC: 32.6 g/dL (ref 30.0–36.0)
MCV: 88.9 fl (ref 78.0–100.0)
Monocytes Absolute: 0.9 K/uL (ref 0.1–1.0)
Monocytes Relative: 12.4 % — ABNORMAL HIGH (ref 3.0–12.0)
Neutro Abs: 5.3 K/uL (ref 1.4–7.7)
Neutrophils Relative %: 73.5 % (ref 43.0–77.0)
Platelets: 508 K/uL — ABNORMAL HIGH (ref 150.0–400.0)
RBC: 3.59 Mil/uL — ABNORMAL LOW (ref 3.87–5.11)
RDW: 15.4 % (ref 11.5–15.5)
WBC: 7.2 K/uL (ref 4.0–10.5)

## 2024-11-13 LAB — MAGNESIUM: Magnesium: 1.4 mg/dL — ABNORMAL LOW (ref 1.5–2.5)

## 2024-11-13 MED ORDER — TRAMADOL HCL 50 MG PO TABS
50.0000 mg | ORAL_TABLET | Freq: Four times a day (QID) | ORAL | 0 refills | Status: DC | PRN
  Filled 2024-11-13: qty 28, 7d supply, fill #0

## 2024-11-13 MED ORDER — ONDANSETRON 4 MG PO TBDP
4.0000 mg | ORAL_TABLET | Freq: Three times a day (TID) | ORAL | 2 refills | Status: AC | PRN
  Filled 2024-11-13: qty 60, 20d supply, fill #0
  Filled 2024-12-23: qty 60, 20d supply, fill #1

## 2024-11-13 MED ORDER — METOPROLOL TARTRATE 50 MG PO TABS
50.0000 mg | ORAL_TABLET | Freq: Two times a day (BID) | ORAL | 3 refills | Status: AC
Start: 2024-11-13 — End: ?
  Filled 2024-11-13: qty 180, 90d supply, fill #0

## 2024-11-13 NOTE — Patient Instructions (Addendum)
 I am restarting your metoprolol  at a slightly lower dose,  take it every 12 hours (roughly)  You can use tramadol  with or without tylenol  for moderate pain.. It is less potent than oxycodone   Zofran  dissolving tablets also sent to [facility] for nausea   Please return a watery stool sample asap so we can rule out clostridium dificile colitis as the cause of your diarrhea

## 2024-11-13 NOTE — Progress Notes (Unsigned)
 Subjective:  Patient ID: Carla Mckinney, female    DOB: 03-04-1961  Age: 63 y.o. MRN: [id number]  CC: There were no encounter diagnoses.   HPI [name] presents for  Chief Complaint  Patient presents with   Hospitalization Follow-up    63 yr old female with colon cancer mets to liver diagnosed  May 2025  (normal colonoscopy in 2018) who is s/p right hepatectomy, partial hepatectomy 2/3, cholecystectomy, and HAI pump insertion on 10/17/24 at [facility] . She was discharged on 10/22/24 and readmitted 11/5-11/9/25 after presenting to OSH ED with worsening abdominal distension and fevers, with CT demonstrating large postoperative fluid collection in the RUQ surgical bed. She was managed with IV diuresis and transitioned to oral diuretics upon discharge. She represented to [facility] ED on 11/02/24 with persistent abdominal pain and dyspnea. She had presented to her PCP earlier that day and had an abnormal EKG. She was admitted for ongoing management.    She underwent diagnostic paracentesis on 11/14 followed by therapeutic paracentesis on 11/15 with removal of 2L of peritoneal fluid. The fluid cultures did not grow any organisms. Hepatology was consulted and provided recommendations for diuresis. Pain was managed with multimodal analgesia. Patient was ambulating the halls and had return of bowel function. Antibiotics were discontinued on 11/20. DVT prophylaxis given with plan to continue for 28 days total from her index operation with Lovenox  SQ 40 mg daily.   All incisions were healing well. Patient was deemed safe for discharge after follow up arrangements were made.   She presents today for hospital follow up and repeat labs.  Current diuretic regimen is furosemide   40 mg spironolactone  100 mg    Outpatient Medications Prior to Visit  Medication Sig Dispense Refill   acetaminophen  (TYLENOL ) 325 MG tablet Take 1 tablet (325 mg total) by mouth every 6 (six) hours. 30 tablet 0   enoxaparin   (LOVENOX ) 40 MG/0.4ML injection Inject 0.4 mLs (40 mg total) into the skin daily for 23 days. Rotate site with each injection. 9.2 mL 0   furosemide  (LASIX ) 20 MG tablet Take 2 tablets (40 mg total) by mouth once daily for 14 days 14 tablet 0   oxyCODONE  (OXY IR/ROXICODONE ) 5 MG immediate release tablet Take 1 tablet (5 mg total) by mouth every 4 (four) hours as needed for severe pain (pain score 7-10). 120 tablet 0   oxyCODONE  (OXY IR/ROXICODONE ) 5 MG immediate release tablet Take 1 tablet (5 mg total) by mouth every 4 (four) hours as needed for Pain for up to 5 days 10 tablet 0   pantoprazole  (PROTONIX ) 40 MG tablet Take 1 tablet (40 mg total) by mouth daily. 30 tablet 0   potassium chloride  SA (KLOR-CON  M) 20 MEQ tablet Take 1 tablet (20 mEq total) by mouth 2 (two) times daily 60 tablet 0   spironolactone  (ALDACTONE ) 100 MG tablet Take 1 tablet (100 mg total) by mouth once daily for 14 days 14 tablet 0   amLODipine  (NORVASC ) 10 MG tablet Take 1 tablet (10 mg total) by mouth daily. (Patient not taking: Reported on 11/13/2024) 90 tablet 1   cholecalciferol (VITAMIN D3) 25 MCG (1000 UNIT) tablet Take 1,000 Units by mouth daily. (Patient not taking: Reported on 11/13/2024)     clindamycin  (CLEOCIN  T) 1 % lotion Apply topically 2 (two) times daily. (Patient not taking: Reported on 11/13/2024) 60 mL 0   dexamethasone  (DECADRON ) 4 MG tablet Take 2 tablets (8 mg total) by mouth daily. Start the day after  chemotherapy for 2 days. Take with food. (Patient not taking: Reported on 11/13/2024) 30 tablet 1   diltiazem  (CARDIZEM ) 30 MG tablet Take 1 tablet (30 mg total) by mouth as needed (rapid heart rate). (Patient not taking: Reported on 11/13/2024) 90 tablet 2   escitalopram  (LEXAPRO ) 10 MG tablet Take 1 tablet (10 mg total) by mouth daily. (Patient not taking: Reported on 11/02/2024) 30 tablet 0   hydrocortisone  1 % lotion Apply 1 Application topically 2 (two) times daily. (Patient not taking: Reported on  11/13/2024) 114 g 0   lactulose  (CHRONULAC ) 10 GM/15ML solution Take 15-30 mLs (10-20 g total) by mouth 2 (two) times daily as needed for mild constipation. (Patient not taking: Reported on 11/13/2024) 236 mL 0   lidocaine -prilocaine  (EMLA ) cream Apply to affected area once (Patient not taking: Reported on 11/13/2024) 30 g 3   magic mouthwash (multi-ingredient) oral suspension Take 5 mLs by mouth 4 (four) times daily as needed for mouth pain. (Patient not taking: Reported on 11/13/2024) 280 mL 3   metoprolol  succinate (TOPROL -XL) 100 MG 24 hr tablet Take 1 tablet (100 mg total) by mouth daily. (Patient not taking: Reported on 11/13/2024) 90 tablet 1   ondansetron  (ZOFRAN ) 4 MG tablet Take 1 tablet (4 mg total) by mouth every 8 (eight) hours as needed for nausea for up to 7 days. (Patient not taking: Reported on 11/13/2024) 20 tablet 0   prochlorperazine  (COMPAZINE ) 10 MG tablet Take 1 tablet (10 mg total) by mouth every 6 (six) hours as needed for nausea or vomiting. (Patient not taking: Reported on 11/13/2024) 30 tablet 1   Facility-Administered Medications Prior to Visit  Medication Dose Route Frequency Provider Last Rate Last Admin   sodium chloride  flush (NS) 0.9 % injection 10 mL  10 mL Intracatheter PRN Melanee Annah BROCKS, MD   10 mL at 07/26/24 1117    Review of Systems;  Patient denies headache, fevers, malaise, unintentional weight loss, skin rash, eye pain, sinus congestion and sinus pain, sore throat, dysphagia,  hemoptysis , cough, dyspnea, wheezing, chest pain, palpitations, orthopnea, edema, abdominal pain, nausea, melena, diarrhea, constipation, flank pain, dysuria, hematuria, urinary  Frequency, nocturia, numbness, tingling, seizures,  Focal weakness, Loss of consciousness,  Tremor, insomnia, depression, anxiety, and suicidal ideation.      Objective:  BP (!) 150/94   Pulse (!) 110   Ht 5' 2 (1.575 m)   Wt 163 lb 9.6 oz (74.2 kg)   SpO2 97%   BMI 29.92 kg/m   BP Readings from  Last 3 Encounters:  11/13/24 (!) 150/94  11/02/24 (!) 150/90  10/25/24 126/76    Wt Readings from Last 3 Encounters:  11/13/24 163 lb 9.6 oz (74.2 kg)  11/02/24 181 lb 3.2 oz (82.2 kg)  10/25/24 159 lb (72.1 kg)    Physical Exam  Lab Results  Component Value Date   HGBA1C 5.3 05/04/2022   HGBA1C 5.3 03/02/2018   HGBA1C 5.3 12/24/2016    Lab Results  Component Value Date   CREATININE 0.53 10/25/2024   CREATININE 0.35 (L) 09/04/2024   CREATININE 0.56 08/22/2024    Lab Results  Component Value Date   WBC 8.3 10/25/2024   HGB 9.5 (L) 10/25/2024   HCT 29.6 (L) 10/25/2024   PLT 228 10/25/2024   GLUCOSE 112 (H) 10/25/2024   CHOL 280 (H) 11/16/2023   TRIG 82.0 11/16/2023   HDL 65.60 11/16/2023   LDLDIRECT 197.0 11/16/2023   LDLCALC 198 (H) 11/16/2023   ALT 64 (H)  10/25/2024   AST 48 (H) 10/25/2024   NA 130 (L) 10/25/2024   K 3.4 (L) 10/25/2024   CL 95 (L) 10/25/2024   CREATININE 0.53 10/25/2024   BUN 7 10/25/2024   CO2 22 10/25/2024   TSH 2.312 05/30/2024   INR 1.0 04/20/2024   HGBA1C 5.3 05/04/2022    CT ABDOMEN PELVIS W CONTRAST Result Date: 10/25/2024 EXAM: CT ABDOMEN AND PELVIS WITH CONTRAST 10/25/2024 02:52:08 PM TECHNIQUE: CT of the abdomen and pelvis was performed with the administration of 100 mL of iohexol  (OMNIPAQUE ) 300 MG/ML solution. Multiplanar reformatted images are provided for review. Automated exposure control, iterative reconstruction, and/or weight-based adjustment of the mA/kV was utilized to reduce the radiation dose to as low as reasonably achievable. COMPARISON: Previous exams are available. CLINICAL HISTORY: Abdominal pain, acute, nonlocalized. FINDINGS: LOWER CHEST: No acute abnormality. LIVER: Status post partial resection of right hepatic lobe. A 3.4 x 2.4 cm complex low density is noted in the lateral segment of the left hepatic lobe. It is uncertain if this represents metastatic disease or possible abscess or infarct. There is a large amount  of fluid seen in the surgical bed in the right upper quadrant. Status post placement of hepatic arterial infusion catheter. GALLBLADDER AND BILE DUCTS: Gallbladder is unremarkable. No biliary ductal dilatation. SPLEEN: Mild splenomegaly. PANCREAS: No acute abnormality. ADRENAL GLANDS: No acute abnormality. KIDNEYS, URETERS AND BLADDER: No stones in the kidneys or ureters. No hydronephrosis. No perinephric or periureteral stranding. Urinary bladder is unremarkable. GI AND BOWEL: Stomach demonstrates no acute abnormality. There is no bowel obstruction. PERITONEUM AND RETROPERITONEUM: Mild-to-moderate amount of free fluid is noted in the pelvis. No free air. VASCULATURE: Aorta is normal in caliber. LYMPH NODES: No lymphadenopathy. REPRODUCTIVE ORGANS: Status post hysterectomy. BONES AND SOFT TISSUES: No acute osseous abnormality. No focal soft tissue abnormality. IMPRESSION: 1. Indeterminate 3.4 x 2.4 cm complex low-density lesion in the lateral segment of the left hepatic lobe; differential includes metastasis, abscess, or infarct. 2. Mild splenomegaly. 3. Large postoperative fluid collection in the right upper quadrant surgical bed following partial right hepatic lobectomy and placement of a hepatic arterial infusion catheter. 4. Mild-to-moderate pelvic ascites. Electronically signed by: Lynwood Seip MD 10/25/2024 03:46 PM EST RP Workstation: [id number]    Assessment & Plan:  .There are no diagnoses linked to this encounter.   I spent 34 minutes on the day of this face to face encounter reviewing patient's  most recent visit with cardiology,  nephrology,  and neurology,  prior relevant surgical and non surgical procedures, recent  labs and imaging studies, counseling on weight management,  reviewing the assessment and plan with patient, and post visit ordering and reviewing of  diagnostics and therapeutics with patient  .   Follow-up: No follow-ups on file.   Verneita LITTIE Kettering, MD

## 2024-11-14 ENCOUNTER — Ambulatory Visit: Payer: Self-pay | Admitting: Internal Medicine

## 2024-11-14 DIAGNOSIS — Z09 Encounter for follow-up examination after completed treatment for conditions other than malignant neoplasm: Secondary | ICD-10-CM | POA: Insufficient documentation

## 2024-11-14 NOTE — Assessment & Plan Note (Addendum)
 Treated with FOLFOX panitumumab  chemotherapy since June , most recently  s/p right hepatectomy, partial hepatectomy 2/3, cholecystectomy, and HAI pump insertion on 10/17/24 at [facility] .

## 2024-11-14 NOTE — Assessment & Plan Note (Signed)
Patient is stable post discharge and has no new issues or questions about discharge plans at the visit today for hospital follow up.  I have reviewed the records from the hospital admission in detail with patient today. 

## 2024-11-17 ENCOUNTER — Other Ambulatory Visit: Payer: Self-pay

## 2024-11-17 DIAGNOSIS — R180 Malignant ascites: Secondary | ICD-10-CM | POA: Diagnosis not present

## 2024-11-17 DIAGNOSIS — C787 Secondary malignant neoplasm of liver and intrahepatic bile duct: Secondary | ICD-10-CM | POA: Diagnosis not present

## 2024-11-17 DIAGNOSIS — Z9049 Acquired absence of other specified parts of digestive tract: Secondary | ICD-10-CM | POA: Diagnosis not present

## 2024-11-17 DIAGNOSIS — C189 Malignant neoplasm of colon, unspecified: Secondary | ICD-10-CM | POA: Diagnosis not present

## 2024-11-17 DIAGNOSIS — R140 Abdominal distension (gaseous): Secondary | ICD-10-CM | POA: Diagnosis not present

## 2024-11-17 MED ORDER — FUROSEMIDE 20 MG PO TABS
40.0000 mg | ORAL_TABLET | Freq: Every day | ORAL | 0 refills | Status: DC
  Filled 2024-11-17: qty 56, 28d supply, fill #0

## 2024-11-17 MED ORDER — SPIRONOLACTONE 100 MG PO TABS
100.0000 mg | ORAL_TABLET | Freq: Every day | ORAL | 0 refills | Status: DC
  Filled 2024-11-17: qty 28, 28d supply, fill #0

## 2024-11-17 MED ORDER — POTASSIUM CHLORIDE CRYS ER 20 MEQ PO TBCR
20.0000 meq | EXTENDED_RELEASE_TABLET | Freq: Two times a day (BID) | ORAL | 0 refills | Status: DC
  Filled 2024-11-17: qty 56, 28d supply, fill #0

## 2024-11-17 MED ORDER — PANTOPRAZOLE SODIUM 40 MG PO TBEC
40.0000 mg | DELAYED_RELEASE_TABLET | Freq: Every day | ORAL | 0 refills | Status: DC
  Filled 2024-11-17: qty 30, 30d supply, fill #0

## 2024-11-27 ENCOUNTER — Inpatient Hospital Stay: Admitting: Internal Medicine

## 2024-12-08 DIAGNOSIS — C19 Malignant neoplasm of rectosigmoid junction: Secondary | ICD-10-CM | POA: Diagnosis not present

## 2024-12-08 DIAGNOSIS — C787 Secondary malignant neoplasm of liver and intrahepatic bile duct: Secondary | ICD-10-CM | POA: Diagnosis not present

## 2024-12-08 DIAGNOSIS — R188 Other ascites: Secondary | ICD-10-CM | POA: Diagnosis not present

## 2024-12-08 DIAGNOSIS — C189 Malignant neoplasm of colon, unspecified: Secondary | ICD-10-CM | POA: Diagnosis not present

## 2024-12-11 ENCOUNTER — Other Ambulatory Visit: Payer: Self-pay

## 2024-12-11 DIAGNOSIS — R188 Other ascites: Secondary | ICD-10-CM | POA: Diagnosis not present

## 2024-12-11 DIAGNOSIS — T451X5A Adverse effect of antineoplastic and immunosuppressive drugs, initial encounter: Secondary | ICD-10-CM | POA: Diagnosis not present

## 2024-12-11 DIAGNOSIS — C187 Malignant neoplasm of sigmoid colon: Secondary | ICD-10-CM | POA: Diagnosis not present

## 2024-12-11 DIAGNOSIS — Z79899 Other long term (current) drug therapy: Secondary | ICD-10-CM | POA: Diagnosis not present

## 2024-12-11 DIAGNOSIS — G620 Drug-induced polyneuropathy: Secondary | ICD-10-CM | POA: Diagnosis not present

## 2024-12-11 DIAGNOSIS — C189 Malignant neoplasm of colon, unspecified: Secondary | ICD-10-CM | POA: Diagnosis not present

## 2024-12-11 DIAGNOSIS — K766 Portal hypertension: Secondary | ICD-10-CM | POA: Diagnosis not present

## 2024-12-11 DIAGNOSIS — E876 Hypokalemia: Secondary | ICD-10-CM | POA: Diagnosis not present

## 2024-12-11 DIAGNOSIS — Z9689 Presence of other specified functional implants: Secondary | ICD-10-CM | POA: Diagnosis not present

## 2024-12-11 DIAGNOSIS — C787 Secondary malignant neoplasm of liver and intrahepatic bile duct: Secondary | ICD-10-CM | POA: Diagnosis not present

## 2024-12-11 MED ORDER — SPIRONOLACTONE 50 MG PO TABS
50.0000 mg | ORAL_TABLET | Freq: Every day | ORAL | 11 refills | Status: AC
  Filled 2024-12-11: qty 30, 30d supply, fill #0

## 2024-12-11 MED ORDER — POTASSIUM CHLORIDE ER 10 MEQ PO CPCR
30.0000 meq | ORAL_CAPSULE | Freq: Every day | ORAL | 2 refills | Status: DC
  Filled 2024-12-11: qty 90, 30d supply, fill #0

## 2024-12-14 ENCOUNTER — Encounter: Payer: Self-pay | Admitting: Internal Medicine

## 2024-12-15 NOTE — Telephone Encounter (Signed)
 Pt requesting refill for tramadol . Currently has 4 pills left.

## 2024-12-17 MED ORDER — TRAMADOL HCL 50 MG PO TABS
50.0000 mg | ORAL_TABLET | Freq: Two times a day (BID) | ORAL | 2 refills | Status: AC | PRN
  Filled 2024-12-17: qty 60, 30d supply, fill #0

## 2024-12-18 ENCOUNTER — Other Ambulatory Visit: Payer: Self-pay

## 2024-12-18 ENCOUNTER — Other Ambulatory Visit: Payer: Self-pay | Admitting: *Deleted

## 2024-12-18 DIAGNOSIS — C787 Secondary malignant neoplasm of liver and intrahepatic bile duct: Secondary | ICD-10-CM

## 2024-12-18 MED ORDER — FUROSEMIDE 20 MG PO TABS
40.0000 mg | ORAL_TABLET | Freq: Every day | ORAL | 0 refills | Status: DC
  Filled 2024-12-18: qty 56, 28d supply, fill #0

## 2024-12-22 ENCOUNTER — Ambulatory Visit: Payer: Self-pay | Admitting: Internal Medicine

## 2024-12-22 ENCOUNTER — Other Ambulatory Visit (INDEPENDENT_AMBULATORY_CARE_PROVIDER_SITE_OTHER)

## 2024-12-22 DIAGNOSIS — C787 Secondary malignant neoplasm of liver and intrahepatic bile duct: Secondary | ICD-10-CM

## 2024-12-22 DIAGNOSIS — C189 Malignant neoplasm of colon, unspecified: Secondary | ICD-10-CM

## 2024-12-22 LAB — BASIC METABOLIC PANEL WITH GFR
BUN: 7 mg/dL (ref 6–23)
CO2: 25 meq/L (ref 19–32)
Calcium: 10.4 mg/dL (ref 8.4–10.5)
Chloride: 93 meq/L — ABNORMAL LOW (ref 96–112)
Creatinine, Ser: 0.77 mg/dL (ref 0.40–1.20)
GFR: 83.47 mL/min
Glucose, Bld: 115 mg/dL — ABNORMAL HIGH (ref 70–99)
Potassium: 3.9 meq/L (ref 3.5–5.1)
Sodium: 131 meq/L — ABNORMAL LOW (ref 135–145)

## 2024-12-26 ENCOUNTER — Other Ambulatory Visit: Payer: Self-pay

## 2024-12-26 DIAGNOSIS — C189 Malignant neoplasm of colon, unspecified: Secondary | ICD-10-CM

## 2024-12-27 ENCOUNTER — Encounter: Payer: Self-pay | Admitting: Oncology

## 2024-12-27 ENCOUNTER — Other Ambulatory Visit: Payer: Self-pay

## 2024-12-27 ENCOUNTER — Other Ambulatory Visit (HOSPITAL_COMMUNITY): Payer: Self-pay

## 2024-12-27 MED ORDER — SPIRONOLACTONE 50 MG PO TABS
50.0000 mg | ORAL_TABLET | Freq: Every day | ORAL | 11 refills | Status: AC
  Filled 2024-12-27 – 2024-12-31 (×2): qty 30, 30d supply, fill #0

## 2024-12-27 MED ORDER — PANTOPRAZOLE SODIUM 40 MG PO TBEC
40.0000 mg | DELAYED_RELEASE_TABLET | Freq: Every day | ORAL | 6 refills | Status: AC
  Filled 2024-12-27: qty 30, 30d supply, fill #0
  Filled 2025-01-22: qty 90, 90d supply, fill #1

## 2024-12-29 ENCOUNTER — Inpatient Hospital Stay: Attending: Oncology

## 2024-12-29 ENCOUNTER — Inpatient Hospital Stay: Admitting: Oncology

## 2024-12-29 ENCOUNTER — Encounter: Payer: Self-pay | Admitting: Oncology

## 2024-12-29 ENCOUNTER — Inpatient Hospital Stay

## 2024-12-29 ENCOUNTER — Other Ambulatory Visit: Payer: Self-pay

## 2024-12-29 VITALS — BP 120/88 | HR 79 | Temp 96.9°F | Resp 19 | Ht 62.0 in | Wt 132.3 lb

## 2024-12-29 DIAGNOSIS — C787 Secondary malignant neoplasm of liver and intrahepatic bile duct: Secondary | ICD-10-CM

## 2024-12-29 DIAGNOSIS — C19 Malignant neoplasm of rectosigmoid junction: Secondary | ICD-10-CM | POA: Diagnosis present

## 2024-12-29 DIAGNOSIS — E46 Unspecified protein-calorie malnutrition: Secondary | ICD-10-CM | POA: Insufficient documentation

## 2024-12-29 DIAGNOSIS — Z79899 Other long term (current) drug therapy: Secondary | ICD-10-CM | POA: Insufficient documentation

## 2024-12-29 DIAGNOSIS — C189 Malignant neoplasm of colon, unspecified: Secondary | ICD-10-CM

## 2024-12-29 DIAGNOSIS — G893 Neoplasm related pain (acute) (chronic): Secondary | ICD-10-CM | POA: Diagnosis not present

## 2024-12-29 DIAGNOSIS — Z807 Family history of other malignant neoplasms of lymphoid, hematopoietic and related tissues: Secondary | ICD-10-CM | POA: Insufficient documentation

## 2024-12-29 LAB — CBC WITH DIFFERENTIAL (CANCER CENTER ONLY)
Abs Immature Granulocytes: 0.02 K/uL (ref 0.00–0.07)
Basophils Absolute: 0 K/uL (ref 0.0–0.1)
Basophils Relative: 0 %
Eosinophils Absolute: 0.1 K/uL (ref 0.0–0.5)
Eosinophils Relative: 2 %
HCT: 33.9 % — ABNORMAL LOW (ref 36.0–46.0)
Hemoglobin: 10.8 g/dL — ABNORMAL LOW (ref 12.0–15.0)
Immature Granulocytes: 0 %
Lymphocytes Relative: 20 %
Lymphs Abs: 1 K/uL (ref 0.7–4.0)
MCH: 26.2 pg (ref 26.0–34.0)
MCHC: 31.9 g/dL (ref 30.0–36.0)
MCV: 82.1 fL (ref 80.0–100.0)
Monocytes Absolute: 0.5 K/uL (ref 0.1–1.0)
Monocytes Relative: 9 %
Neutro Abs: 3.6 K/uL (ref 1.7–7.7)
Neutrophils Relative %: 69 %
Platelet Count: 284 K/uL (ref 150–400)
RBC: 4.13 MIL/uL (ref 3.87–5.11)
RDW: 15.1 % (ref 11.5–15.5)
WBC Count: 5.2 K/uL (ref 4.0–10.5)
nRBC: 0 % (ref 0.0–0.2)

## 2024-12-29 LAB — CMP (CANCER CENTER ONLY)
ALT: 12 U/L (ref 0–44)
AST: 28 U/L (ref 15–41)
Albumin: 4.5 g/dL (ref 3.5–5.0)
Alkaline Phosphatase: 97 U/L (ref 38–126)
Anion gap: 11 (ref 5–15)
BUN: 9 mg/dL (ref 8–23)
CO2: 24 mmol/L (ref 22–32)
Calcium: 11.2 mg/dL — ABNORMAL HIGH (ref 8.9–10.3)
Chloride: 97 mmol/L — ABNORMAL LOW (ref 98–111)
Creatinine: 0.84 mg/dL (ref 0.44–1.00)
GFR, Estimated: >60 mL/min
Glucose, Bld: 103 mg/dL — ABNORMAL HIGH (ref 70–99)
Potassium: 4.1 mmol/L (ref 3.5–5.1)
Sodium: 133 mmol/L — ABNORMAL LOW (ref 135–145)
Total Bilirubin: 0.7 mg/dL (ref 0.0–1.2)
Total Protein: 7.3 g/dL (ref 6.5–8.1)

## 2024-12-29 LAB — MAGNESIUM: Magnesium: 1.6 mg/dL — ABNORMAL LOW (ref 1.7–2.4)

## 2024-12-29 NOTE — Progress Notes (Signed)
 "    Hematology/Oncology Consult note [facility]  [phone] [phone]  Patient Care Team: Marylynn Verneita CROME, MD as PCP - General (Internal Medicine) Maurie Rayfield BIRCH, RN as Oncology Nurse Navigator Melanee Annah BROCKS, MD as Consulting Physician (Oncology)   Name of the patient: Carla Mckinney  [id number]  1961/12/13   Date of visit: 12/29/2024  Diagnosis-metastatic colon adenocarcinoma   Cancer Staging  Colon cancer metastasized to liver [facility]) Staging form: Colon and Rectum, AJCC 8th Edition - Clinical stage from 12/29/2024: Stage IVA (cT2, cNX, pM1a) - Signed by Melanee Annah BROCKS, MD on 12/29/2024     Chief complaint/ Reason for visit-routine follow-up of metastatic colon cancer  Heme/Onc history: patient is a 64 year old female with a past medical history significant for hypercalcemia due to hyperparathyroidism, hyperlipidemia among other medical problems. She was noted to have bright red blood in her stools along with symptoms of unintentional weight loss.   Her last colonoscopy in 2018 was unremarkable.     Patient underwent CT abdomen and pelvis with contrast on 05/03/2024 for symptoms of right upper quadrant abdominal pain which showed large enhancing masses in the liver measuring up to 12.5 cm in the right hepatic lobe and 8.5 cm in the posterior right hepatic dome.  No evidence of intra-abdominal adenopathy.  This was followed by MRI abdomen with and without contrast which was ordered by me on 05/04/2024 which showed extremely bulky heterogeneously hypoenhancing masses occupying the majority of the right lobe of the liver measuring 14.9 x 12.9 x 9.1 cm.  Occasional incidental benign cyst throughout the liver.  Pancreas, spleen and adrenal glands and kidney appeared normal.  Stomach and intra-abdominal lymph nodes appeared normal.  Rectosigmoid mass was noted along with mesenteric adenopathy concerning for primary origin of these metastases.  aFP was  normal.  CEA was elevated at 305.  CA 19-9 elevated at 176.MMR stable.   Rectal biopsy on 05/09/2024 showed invasive moderately differentiated adenocarcinoma.  Liver biopsy was therefore not performed.   Patient was given 1 dose of FOLFOX Avastin  chemotherapy.  However after K-ras and NRAS wild-type testing and given the left-sided tumor she was switched to FOLFOX panitumumab       NGS testing showed T p53, APC, BRAF, APC, PHLPP 1, PPP2R2A SMAD4 mutations.  K-ras and NRAS negative.  MSI stable.  MMR normal.  Tumor mutational burden low.   Given the left-sided tumor, bulky liver metastases and BRAF/NRAS wild-type patient has been on FOLFOX panitumumab  chemotherapy since June 2025.  Patient received palliative FOLFOX panitumumab  chemotherapy up until 09/04/2024.  CT scans following that showed excellent response to treatment as evidenced by decrease in the size of liver metastases.  Patient then met with pancreaticobiliary surgery Dr. Romero and underwent right partial hepatectomy on 10/17/2024 and placement of HAI pump.Pathology showed metastatic colon carcinoma 8 cm in greatest dimension with 70% tumor necrosis.  Mass to was 6.7 cm in greatest dimension with 80% tumor necrosis.  There was also 1 cm: Carcinoma located in the left hepatic lobe which was resected.  The lymph nodes negative for malignancy.  Initially plan was for HAI pump based treatment and adjuvant chemotherapy for 2 to 4 months following surgery.  However patient has had a slow recovery postsurgery and has developed portal hypertension and therefore no chemotherapy planned at this time    Interval history- Discussed the use of AI scribe software for clinical note transcription with the patient, who gave verbal consent to proceed.  History  of Present Illness   Carla Mckinney is a 64 year old female with metastatic colorectal cancer, malignant ascites, and electrolyte disturbances who presents for oncology follow-up after a  complicated postoperative recovery.  She underwent right partial hepatectomy for metastatic colorectal cancer involving two large right hepatic masses and a 1 cm left lobe lesion, with significant tumor necrosis on pathology. A hepatic arterial infusion pump was placed in the left lobe, but no chemotherapy or pump infusion has been initiated due to her prolonged postoperative course. Recent CT imaging (12/08/2024) showed no evidence of active hepatic disease. The rectal mass remains untreated, with no current plan for surgery or chemotherapy.  She has experienced intermittent rectal bleeding, with increased severity on the morning of this visit. Bleeding is primarily noted on tissue and occasionally in the stool, without significant drops in hemoglobin (current 10.8, previously 11.8). Flexible sigmoidoscopy and colonoscopy confirmed a low rectal mass.  Her postoperative course has been complicated by persistent abdominal swelling and malignant ascites, requiring two hospitalizations for pain and fluid overload. Ascites required intermittent drainage, with rapid reaccumulation. Lower extremity edema was present during hospitalization but has since resolved. She is currently managed with spironolactone  (approximately 100 mg daily) and furosemide  (previously up to 80 mg daily), with daily weights and close monitoring.  She has ongoing hypokalemia and hypomagnesemia, managed with oral and intravenous supplementation. Potassium levels are currently stable (4.1), but she finds potassium supplementation difficult to tolerate due to pill size and taste, with both powder and tablet forms causing nausea. She is not taking calcium  supplements but has chronic mild hypercalcemia (current 11.2).  Pain has been significant since surgery, with severe pain and limited mobility for several months. She currently manages pain with tramadol  and acetaminophen , preferring these over oxycodone  due to drowsiness and a desire to  remain active. Occasional oxycodone  is reserved for severe pain, particularly at night. She has recently begun to regain mobility, attending her grandson's wrestling match for the first time in three months, though she fatigues easily and remains limited in activity.  Nutritional status remains poor, with persistent early satiety, poor appetite, and dysgeusia. She attempts to consume protein shakes but struggles to maintain adequate intake. She met with a nutritionist but continues to have difficulty eating due to fullness and lack of appetite. She had planned to return to work prior to surgery but has been unable to do so due to her prolonged recovery. Family members are actively involved in her care, assisting with medication management and daily monitoring of weight and symptoms.       ECOG PS- 2 Pain scale- 3 Opioid associated constipation- no  Review of systems- Review of Systems  Constitutional:  Positive for malaise/fatigue and weight loss. Negative for chills and fever.       Poor appetite  HENT:  Negative for congestion, ear discharge and nosebleeds.   Eyes:  Negative for blurred vision.  Respiratory:  Negative for cough, hemoptysis, sputum production, shortness of breath and wheezing.   Cardiovascular:  Negative for chest pain, palpitations, orthopnea and claudication.  Gastrointestinal:  Positive for abdominal pain. Negative for blood in stool, constipation, diarrhea, heartburn, melena, nausea and vomiting.  Genitourinary:  Negative for dysuria, flank pain, frequency, hematuria and urgency.  Musculoskeletal:  Negative for back pain, joint pain and myalgias.  Skin:  Negative for rash.  Neurological:  Negative for dizziness, tingling, focal weakness, seizures, weakness and headaches.  Endo/Heme/Allergies:  Does not bruise/bleed easily.  Psychiatric/Behavioral:  Negative for depression and suicidal ideas.  The patient does not have insomnia.       Allergies[1]   Past Medical  History:  Diagnosis Date   Hypertension    First started during pregnancy   Rheumatic fever 1986   has had echo since then. no cardiac issues.     Past Surgical History:  Procedure Laterality Date   ABDOMINAL HYSTERECTOMY     AUGMENTATION MAMMAPLASTY Bilateral    breast lift   CARDIAC CATHETERIZATION  2002   normal, due to syncope and abnormal myowiew ([place])   COLONOSCOPY WITH PROPOFOL  N/A 09/15/2017   Procedure: COLONOSCOPY WITH PROPOFOL ;  Surgeon: Unk Corinn Skiff, MD;  Location: [facility];  Service: Gastroenterology;  Laterality: N/A;   IR IMAGING GUIDED PORT INSERTION  05/11/2024   MASTOPEXY  2008   OOPHORECTOMY     secondary to scar tissue,  [facility]   RIGHT OOPHORECTOMY     TONSILLECTOMY     TUBAL LIGATION      Social History   Socioeconomic History   Marital status: Married    Spouse name: Not on file   Number of children: 2   Years of education: Not on file   Highest education level: Associate degree: academic program  Occupational History   Not on file  Tobacco Use   Smoking status: Never   Smokeless tobacco: Never  Vaping Use   Vaping status: Never Used  Substance and Sexual Activity   Alcohol use: Yes    Alcohol/week: 1.0 standard drink of alcohol    Types: 1 Glasses of wine per week    Comment: socially   Drug use: No   Sexual activity: Yes    Birth control/protection: None  Other Topics Concern   Not on file  Social History Narrative   Not on file   Social Drivers of Health   Tobacco Use: Low Risk  (12/11/2024)   Received from [facility]   Patient History    Smoking Tobacco Use: Never    Smokeless Tobacco Use: Never    Passive Exposure: Not on file  Financial Resource Strain: Low Risk  (11/02/2024)   Received from [facility]   Overall Financial Resource Strain (CARDIA)    Difficulty of Paying Living Expenses: Not hard at all  Food Insecurity: No Food Insecurity (11/02/2024)   Received from  [facility]   Epic    Within the past 12 months, you worried that your food would run out before you got the money to buy more.: Never true    Within the past 12 months, the food you bought just didn't last and you didn't have money to get more.: Never true  Transportation Needs: No Transportation Needs (11/02/2024)   Received from [facility] - Transportation    In the past 12 months, has lack of transportation kept you from medical appointments or from getting medications?: No    Lack of Transportation (Non-Medical): No  Physical Activity: Insufficiently Active (05/05/2024)   Exercise Vital Sign    Days of Exercise per Week: 2 days    Minutes of Exercise per Session: 20 min  Stress: No Stress Concern Present (11/16/2023)   [employer] of Occupational Health - Occupational Stress Questionnaire    Feeling of Stress : Not at all  Social Connections: Moderately Integrated (11/16/2023)   Social Connection and Isolation Panel    Frequency of Communication with Friends and Family: More than three times a week  Frequency of Social Gatherings with Friends and Family: Twice a week    Attends Religious Services: More than 4 times per year    Active Member of [employer] or Organizations: No    Attends Engineer, Structural: Not on file    Marital Status: Married  Catering Manager Violence: Not At Risk (05/05/2024)   Humiliation, Afraid, Rape, and Kick questionnaire    Fear of Current or Ex-Partner: No    Emotionally Abused: No    Physically Abused: No    Sexually Abused: No  Depression (PHQ2-9): Low Risk (11/13/2024)   Depression (PHQ2-9)    PHQ-2 Score: 0  Alcohol Screen: Low Risk (11/16/2023)   Alcohol Screen    Last Alcohol Screening Score (AUDIT): 2  Housing: Low Risk  (11/02/2024)   Received from [facility]   Epic    In the last 12 months, was there a time when you were not able to pay the mortgage or rent on  time?: No    In the past 12 months, how many times have you moved where you were living?: 0    At any time in the past 12 months, were you homeless or living in a shelter (including now)?: No  Utilities: Not At Risk (11/02/2024)   Received from [facility]   Epic    In the past 12 months has the electric, gas, oil, or water  company threatened to shut off services in your home?: No  Health Literacy: Adequate Health Literacy (05/22/2024)   B1300 Health Literacy    Frequency of need for help with medical instructions: Never    Family History  Problem Relation Age of Onset   Heart disease Mother    Heart failure Mother    Hypertension Father    Hyperlipidemia Father    Heart disease Maternal Grandfather    Heart disease Paternal Grandmother    Stroke Paternal Grandmother 10   Cancer Paternal Grandfather        multiple myeloma   Breast cancer Neg Hx     Current Medications[2]  Physical exam: There were no vitals filed for this visit. Physical Exam Constitutional:      Comments: Appears weight thinner as compared to before  Cardiovascular:     Rate and Rhythm: Normal rate and regular rhythm.     Heart sounds: Normal heart sounds.  Pulmonary:     Effort: Pulmonary effort is normal.     Breath sounds: Normal breath sounds.  Abdominal:     General: Bowel sounds are normal.     Palpations: Abdomen is soft.     Comments: Intrahepatic pump in place with superficial swelling noted around it.  Scar of recent hepatectomy noted and healing well  Musculoskeletal:     Right lower leg: No edema.     Left lower leg: No edema.  Skin:    General: Skin is warm and dry.  Neurological:     Mental Status: She is alert and oriented to person, place, and time.      I have personally reviewed labs listed below:    Latest Ref Rng & Units 12/22/2024   11:00 AM  CMP  Glucose 70 - 99 mg/dL 884   BUN 6 - 23 mg/dL 7   Creatinine 9.59 - 8.79 mg/dL 9.22   Sodium 864 - 854 mEq/L  131   Potassium 3.5 - 5.1 mEq/L 3.9   Chloride 96 - 112 mEq/L 93   CO2 19 -  32 mEq/L 25   Calcium  8.4 - 10.5 mg/dL 89.5       Latest Ref Rng & Units 11/13/2024   10:53 AM  CBC  WBC 4.0 - 10.5 K/uL 7.2   Hemoglobin 12.0 - 15.0 g/dL 89.5   Hematocrit 63.9 - 46.0 % 31.9   Platelets 150.0 - 400.0 K/uL 508.0      Assessment and plan- Patient is a 64 y.o. female with metastatic colon cancer and liver metastases here for routine follow-up  Assessment and Plan    Metastatic rectal cancer with liver metastases -Patient underwent 4 months of chemotherapy and my initial plan was to see if she would be a candidate for intrahepatic pump.  However patient's liver disease was deemed to be resectable and patient underwent partial right hepatic Timmie as well as resection of the isolated left liver lesion. -Plan was for her to receive adjuvant chemotherapy along with pump based treatment for 2 to 4 months postsurgery and then consider staged resection of the primary rectal mass.  However patient has had a long recovery complicated by portal hypertension and ongoing rectal pain. -Given that she is not getting any systemic chemotherapy at present and there is a concern for the primary rectal mass growing in the interim, I will refer her to radiation oncology to consider radiation for the same more than concern for mild ongoing rectal bleeding. -Patient is still somewhat frail and it is unclear if she can proceed with radiation to her rectal mass right away and needs to wait a little longer.  History of colon cancer with hepatic metastases, post hepatectomy Post right partial hepatectomy with no active hepatic disease on imaging. Recovery ongoing with no chemotherapy or pump therapy planned. - Continued surveillance with periodic imaging and clinical follow-up. - No chemotherapy or pump therapy planned; focus remains on recovery.   Ascites secondary to portal hypertension: Ascites post-hepatectomy  managed with diuretics, improved but recurs intermittently. Fluid status monitored with daily weights. - Continued current diuretic regimen (furosemide  and spironolactone ).  She is also following up with hepatology at [facility] next week - Monitored daily weights and fluid status.  Electrolyte disturbances (hypokalemia, hypomagnesemia) Hypokalemia and hypomagnesemia due to diuretics, managed with oral potassium and IV infusions. Potassium well-controlled, ongoing monitoring required. - Ordered blood work to monitor potassium and magnesium  levels. - Continued oral potassium supplementation as tolerated.  Hypercalcemia Mild asymptomatic hypercalcemia, no acute intervention required. - Ordered repeat blood work in two weeks to monitor calcium  levels.  Chronic neoplasm-related pain Chronic pain post-hepatectomy managed with tramadol  and acetaminophen . Oxycodone  minimized due to sedation concerns. Pain improving. - Continued tramadol  and acetaminophen  for analgesia. - Used oxycodone  sparingly for breakthrough pain, preferably not in combination with tramadol . - Encouraged mobility and activity as tolerated.  Malnutrition Poor appetite and early satiety postoperatively, difficulty with oral intake. Malnutrition remains a concern. - Encouraged continued use of protein shakes and nutritional support.     Labs in 2 weeks in 4 weeks and I will see her back in 4 weeks    Visit Diagnosis 1. Colon cancer metastasized to liver [facility])      Dr. Annah Skene, MD, MPH [facility] [phone] 12/29/2024 8:42 AM                    [1]  Allergies Allergen Reactions   Oxaliplatin  Hives, Nausea And Vomiting and Other (See Comments)    Hip pain   Lisinopril  Swelling  [2]  Current  Outpatient Medications:    acetaminophen  (TYLENOL ) 325 MG tablet, Take 1 tablet (325 mg total) by mouth every 6 (six) hours., Disp: 30 tablet, Rfl: 0   amLODipine  (NORVASC ) 10 MG  tablet, Take 1 tablet (10 mg total) by mouth daily. (Patient not taking: Reported on 11/13/2024), Disp: 90 tablet, Rfl: 1   clindamycin  (CLEOCIN  T) 1 % lotion, Apply topically 2 (two) times daily. (Patient not taking: Reported on 11/13/2024), Disp: 60 mL, Rfl: 0   enoxaparin  (LOVENOX ) 40 MG/0.4ML injection, Inject 0.4 mLs (40 mg total) into the skin daily for 23 days. Rotate site with each injection., Disp: 9.2 mL, Rfl: 0   escitalopram  (LEXAPRO ) 10 MG tablet, Take 1 tablet (10 mg total) by mouth daily. (Patient not taking: Reported on 11/02/2024), Disp: 30 tablet, Rfl: 0   furosemide  (LASIX ) 20 MG tablet, Take 2 tablets (40 mg total) by mouth daily., Disp: 56 tablet, Rfl: 0   hydrocortisone  1 % lotion, Apply 1 Application topically 2 (two) times daily. (Patient not taking: Reported on 11/13/2024), Disp: 114 g, Rfl: 0   lidocaine -prilocaine  (EMLA ) cream, Apply to affected area once (Patient not taking: Reported on 11/13/2024), Disp: 30 g, Rfl: 3   metoprolol  tartrate (LOPRESSOR ) 50 MG tablet, Take 1 tablet (50 mg total) by mouth 2 (two) times daily., Disp: 180 tablet, Rfl: 3   ondansetron  (ZOFRAN ) 4 MG tablet, Take 1 tablet (4 mg total) by mouth every 8 (eight) hours as needed for nausea for up to 7 days. (Patient not taking: Reported on 11/13/2024), Disp: 20 tablet, Rfl: 0   ondansetron  (ZOFRAN -ODT) 4 MG disintegrating tablet, Take 1 tablet (4 mg total) by mouth every 8 (eight) hours as needed for nausea or vomiting., Disp: 60 tablet, Rfl: 2   oxyCODONE  (OXY IR/ROXICODONE ) 5 MG immediate release tablet, Take 1 tablet (5 mg total) by mouth every 4 (four) hours as needed for severe pain (pain score 7-10)., Disp: 120 tablet, Rfl: 0   pantoprazole  (PROTONIX ) 40 MG tablet, Take 1 tablet (40 mg total) by mouth daily., Disp: 30 tablet, Rfl: 6   potassium chloride  (MICRO-K ) 10 MEQ CR capsule, Take 3 capsules (30 mEq total) by mouth daily., Disp: 90 capsule, Rfl: 2   prochlorperazine  (COMPAZINE ) 10 MG tablet,  Take 1 tablet (10 mg total) by mouth every 6 (six) hours as needed for nausea or vomiting. (Patient not taking: Reported on 11/13/2024), Disp: 30 tablet, Rfl: 1   spironolactone  (ALDACTONE ) 50 MG tablet, Take 1 tablet (50 mg total) by mouth once daily, Disp: 30 tablet, Rfl: 11   spironolactone  (ALDACTONE ) 50 MG tablet, Take 1 tablet (50 mg total) by mouth daily., Disp: 30 tablet, Rfl: 11   traMADol  (ULTRAM ) 50 MG tablet, Take 1 tablet (50 mg total) by mouth every 12 (twelve) hours as needed for moderate pain (pain score 4-6)., Disp: 60 tablet, Rfl: 2 No current facility-administered medications for this visit.  Facility-Administered Medications Ordered in Other Visits:    sodium chloride  flush (NS) 0.9 % injection 10 mL, 10 mL, Intracatheter, PRN, Melanee Annah BROCKS, MD, 10 mL at 07/26/24 1117  "

## 2024-12-29 NOTE — Progress Notes (Signed)
 Patient has a few new & acute concerns to address during today's visit.

## 2024-12-30 ENCOUNTER — Other Ambulatory Visit: Payer: Self-pay

## 2024-12-30 LAB — CEA: CEA: 3.2 ng/mL (ref 0.0–4.7)

## 2024-12-31 ENCOUNTER — Other Ambulatory Visit: Payer: Self-pay

## 2025-01-01 ENCOUNTER — Other Ambulatory Visit: Payer: Self-pay

## 2025-01-01 ENCOUNTER — Other Ambulatory Visit (HOSPITAL_COMMUNITY): Payer: Self-pay

## 2025-01-01 MED ORDER — SPIRONOLACTONE 50 MG PO TABS
50.0000 mg | ORAL_TABLET | Freq: Every day | ORAL | 11 refills | Status: DC
  Filled 2025-01-01: qty 30, 30d supply, fill #0

## 2025-01-01 MED ORDER — SPIRONOLACTONE 50 MG PO TABS
50.0000 mg | ORAL_TABLET | Freq: Every day | ORAL | 11 refills | Status: DC
  Filled 2025-01-02 (×2): qty 30, 30d supply, fill #0
  Filled 2025-01-22: qty 90, 90d supply, fill #1

## 2025-01-01 MED ORDER — FUROSEMIDE 20 MG PO TABS
20.0000 mg | ORAL_TABLET | Freq: Every day | ORAL | 11 refills | Status: DC
  Filled 2025-01-22: qty 90, 90d supply, fill #0

## 2025-01-02 ENCOUNTER — Other Ambulatory Visit: Payer: Self-pay

## 2025-01-02 ENCOUNTER — Other Ambulatory Visit (HOSPITAL_COMMUNITY): Payer: Self-pay

## 2025-01-02 ENCOUNTER — Encounter: Payer: Self-pay | Admitting: Oncology

## 2025-01-08 ENCOUNTER — Encounter: Payer: Self-pay | Admitting: Oncology

## 2025-01-08 ENCOUNTER — Ambulatory Visit: Admitting: Internal Medicine

## 2025-01-11 NOTE — Telephone Encounter (Signed)
 Disability forms completed. -pending Dr. Darold signature. Patient would like the form to be faxed tomorrow morning and she would like to pick up the original form.

## 2025-01-12 ENCOUNTER — Inpatient Hospital Stay

## 2025-01-12 DIAGNOSIS — C189 Malignant neoplasm of colon, unspecified: Secondary | ICD-10-CM

## 2025-01-12 DIAGNOSIS — C19 Malignant neoplasm of rectosigmoid junction: Secondary | ICD-10-CM | POA: Diagnosis not present

## 2025-01-12 LAB — MAGNESIUM: Magnesium: 1.9 mg/dL (ref 1.7–2.4)

## 2025-01-12 LAB — CMP (CANCER CENTER ONLY)
ALT: 7 U/L (ref 0–44)
AST: 22 U/L (ref 15–41)
Albumin: 3.9 g/dL (ref 3.5–5.0)
Alkaline Phosphatase: 93 U/L (ref 38–126)
Anion gap: 9 (ref 5–15)
BUN: 6 mg/dL — ABNORMAL LOW (ref 8–23)
CO2: 26 mmol/L (ref 22–32)
Calcium: 10.6 mg/dL — ABNORMAL HIGH (ref 8.9–10.3)
Chloride: 105 mmol/L (ref 98–111)
Creatinine: 0.71 mg/dL (ref 0.44–1.00)
GFR, Estimated: >60 mL/min
Glucose, Bld: 104 mg/dL — ABNORMAL HIGH (ref 70–99)
Potassium: 4.1 mmol/L (ref 3.5–5.1)
Sodium: 139 mmol/L (ref 135–145)
Total Bilirubin: 0.7 mg/dL (ref 0.0–1.2)
Total Protein: 6.6 g/dL (ref 6.5–8.1)

## 2025-01-12 NOTE — Telephone Encounter (Signed)
 Pt picked up original forms in office today.

## 2025-01-16 ENCOUNTER — Telehealth: Payer: Self-pay

## 2025-01-16 NOTE — Telephone Encounter (Signed)
 Message received today 01/16/25 at 10:21am from Dr. Lorrayne Purpura, medical oncologist at [facility] who was calling to speak with Dr. Melanee regarding mutual patient. Mentioned not urgent but wanted to discuss plan of care.  Her cell phone # is [phone]. Provider informed.

## 2025-01-19 ENCOUNTER — Telehealth: Payer: Self-pay

## 2025-01-19 NOTE — Telephone Encounter (Signed)
 Copied from CRM [id number]. Topic: Medical Record Request - Records Request >> Jan 19, 2025  2:28 PM Tiffini S wrote: Reason for CRM: Alfonse with [facility] [phone] ext: [phone] fax number: [phone] called asking the doctor office notes and a attending statement form to be filled out     Will fax the request to the office

## 2025-01-19 NOTE — Telephone Encounter (Signed)
 Placed in red folder

## 2025-01-22 ENCOUNTER — Ambulatory Visit: Admitting: Radiation Oncology

## 2025-01-22 ENCOUNTER — Other Ambulatory Visit (HOSPITAL_COMMUNITY): Payer: Self-pay

## 2025-01-22 ENCOUNTER — Other Ambulatory Visit: Payer: Self-pay

## 2025-01-23 ENCOUNTER — Encounter: Payer: Self-pay | Admitting: Internal Medicine

## 2025-01-24 ENCOUNTER — Other Ambulatory Visit (HOSPITAL_BASED_OUTPATIENT_CLINIC_OR_DEPARTMENT_OTHER): Payer: Self-pay

## 2025-01-24 ENCOUNTER — Encounter: Payer: Self-pay | Admitting: Internal Medicine

## 2025-01-24 ENCOUNTER — Encounter: Payer: Self-pay | Admitting: Radiation Oncology

## 2025-01-24 ENCOUNTER — Ambulatory Visit
Admission: RE | Admit: 2025-01-24 | Discharge: 2025-01-24 | Attending: Radiation Oncology | Admitting: Radiation Oncology

## 2025-01-24 VITALS — BP 129/80 | HR 73 | Resp 15 | Ht 62.0 in | Wt 139.0 lb

## 2025-01-24 DIAGNOSIS — C189 Malignant neoplasm of colon, unspecified: Secondary | ICD-10-CM

## 2025-01-24 NOTE — Consult Note (Signed)
 " NEW PATIENT EVALUATION  Name: Carla Mckinney  MRN: [id number]  Date:   01/24/2025     DOB: 02/25/1961   This 64 y.o. female patient presents to the clinic for initial evaluation of palliative radiation therapy to her rectum in a patient with known stage IVa (cT2 NX M1 A) adenocarcinoma: With massive liver metastasis and rectal bleeding.  REFERRING PHYSICIAN: Marylynn Verneita CROME, MD  CHIEF COMPLAINT:  Chief Complaint  Patient presents with   Cancer    DIAGNOSIS: The encounter diagnosis was Malignant neoplasm of colon, unspecified part of colon (HCC).   PREVIOUS INVESTIGATIONS:  PET/CT and CT scans reviewed Clinical notes reviewed Pathology report reviewed She originally presented with bright red blood per rectum.  Workup showed a 12.5 cm right hepatic lobe lesion no evidence of intra-abdominal adenopathy.  MRI confirmed at least a 14.9 cm heterogeneous hypoenhancing mass occupying majority the right lobe of the liver. 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  Patient has been on FOLFOX chemotherapy with excellent response.  She then went to [facility] for a right partial hepatectomy on October 25.  She also an HAI pump placed.  Pathology was consistent with metastatic colon cancer 8 cm in greatest dimension with 70% tumor necrosis.  Patient had a slow recovery postoperatively.  She now feels extremely well although continues to have rectal bleeding.  Her hemoglobin is fairly stable currently at 10.8.  Flexible Sigg and colonoscopy confirmed a low rectal mass is also been present on PET CT scan.  I been asked to evaluate her for possible palliative radiation therapy to prevent further rectal bleeding.  Seen today she is doing fairly well she states she has really made a turning point as after her hepatectomy with her energy level increasing and appetite.  She continues to have some bright red blood per rectum.  PLANNED  TREATMENT REGIMEN: Pelvic radiation therapy in palliative mode  PAST MEDICAL HISTORY:  has a past medical history of Hypertension and Rheumatic fever (1986).    PAST SURGICAL HISTORY:  Past Surgical History:  Procedure Laterality Date   ABDOMINAL HYSTERECTOMY     AUGMENTATION MAMMAPLASTY Bilateral    breast lift   CARDIAC CATHETERIZATION  2002   normal, due to syncope and abnormal myowiew ([place])   COLONOSCOPY WITH PROPOFOL  N/A 09/15/2017   Procedure: COLONOSCOPY WITH PROPOFOL ;  Surgeon: Unk Corinn Skiff, MD;  Location: [facility];  Service: Gastroenterology;  Laterality: N/A;   IR IMAGING GUIDED PORT INSERTION  05/11/2024   LIVER RESECTION N/A 10/17/2024   MASTOPEXY  2008   OOPHORECTOMY     secondary to scar tissue,  [facility]   RIGHT OOPHORECTOMY     TONSILLECTOMY     TUBAL LIGATION      FAMILY HISTORY: family history includes Cancer in her paternal grandfather; Heart disease in her maternal grandfather, mother, and paternal grandmother; Heart failure in her mother; Hyperlipidemia in her father; Hypertension in her father; Stroke (age of onset: 28) in her paternal grandmother.  SOCIAL HISTORY:  reports that she has never smoked. She has never used smokeless tobacco. She reports current alcohol use of about 1.0 standard drink of alcohol per week. She reports that she does not use drugs.  ALLERGIES: Oxaliplatin  and Lisinopril   MEDICATIONS:  Current Outpatient Medications  Medication Sig Dispense Refill   acetaminophen  (TYLENOL ) 325 MG tablet Take 1 tablet (325 mg total) by mouth every 6 (six) hours. 30  tablet 0   amLODipine  (NORVASC ) 10 MG tablet Take 1 tablet (10 mg total) by mouth daily. (Patient not taking: Reported on 11/13/2024) 90 tablet 1   clindamycin  (CLEOCIN  T) 1 % lotion Apply topically 2 (two) times daily. (Patient not taking: Reported on 11/13/2024) 60 mL 0   enoxaparin  (LOVENOX ) 40 MG/0.4ML injection Inject 0.4 mLs (40 mg total) into the skin daily for 23 days.  Rotate site with each injection. 9.2 mL 0   escitalopram  (LEXAPRO ) 10 MG tablet Take 1 tablet (10 mg total) by mouth daily. (Patient not taking: Reported on 11/02/2024) 30 tablet 0   hydrocortisone  1 % lotion Apply 1 Application topically 2 (two) times daily. (Patient not taking: Reported on 11/13/2024) 114 g 0   lidocaine -prilocaine  (EMLA ) cream Apply to affected area once (Patient not taking: Reported on 11/13/2024) 30 g 3   metoprolol  tartrate (LOPRESSOR ) 50 MG tablet Take 1 tablet (50 mg total) by mouth 2 (two) times daily. 180 tablet 3   ondansetron  (ZOFRAN ) 4 MG tablet Take 1 tablet (4 mg total) by mouth every 8 (eight) hours as needed for nausea for up to 7 days. (Patient not taking: Reported on 11/13/2024) 20 tablet 0   ondansetron  (ZOFRAN -ODT) 4 MG disintegrating tablet Take 1 tablet (4 mg total) by mouth every 8 (eight) hours as needed for nausea or vomiting. 60 tablet 2   oxyCODONE  (OXY IR/ROXICODONE ) 5 MG immediate release tablet Take 1 tablet (5 mg total) by mouth every 4 (four) hours as needed for severe pain (pain score 7-10). (Patient not taking: Reported on 12/29/2024) 120 tablet 0   pantoprazole  (PROTONIX ) 40 MG tablet Take 1 tablet (40 mg total) by mouth daily. 30 tablet 6   prochlorperazine  (COMPAZINE ) 10 MG tablet Take 1 tablet (10 mg total) by mouth every 6 (six) hours as needed for nausea or vomiting. (Patient not taking: Reported on 11/13/2024) 30 tablet 1   spironolactone  (ALDACTONE ) 50 MG tablet Take 1 tablet (50 mg total) by mouth once daily (Patient not taking: Reported on 01/24/2025) 30 tablet 11   spironolactone  (ALDACTONE ) 50 MG tablet Take 1 tablet (50 mg total) by mouth daily. (Patient not taking: Reported on 01/24/2025) 30 tablet 11   traMADol  (ULTRAM ) 50 MG tablet Take 1 tablet (50 mg total) by mouth every 12 (twelve) hours as needed for moderate pain (pain score 4-6). 60 tablet 2   No current facility-administered medications for this encounter.   Facility-Administered  Medications Ordered in Other Encounters  Medication Dose Route Frequency Provider Last Rate Last Admin   sodium chloride  flush (NS) 0.9 % injection 10 mL  10 mL Intracatheter PRN Melanee Annah BROCKS, MD   10 mL at 07/26/24 1117    ECOG PERFORMANCE STATUS:  1 - Symptomatic but completely ambulatory  REVIEW OF SYSTEMS: Patient denies any weight loss, fatigue, weakness, fever, chills or night sweats. Patient denies any loss of vision, blurred vision. Patient denies any ringing  of the ears or hearing loss. No irregular heartbeat. Patient denies heart murmur or history of fainting. Patient denies any chest pain or pain radiating to her upper extremities. Patient denies any shortness of breath, difficulty breathing at night, cough or hemoptysis. Patient denies any swelling in the lower legs. Patient denies any nausea vomiting, vomiting of blood, or coffee ground material in the vomitus. Patient denies any stomach pain. Patient states has had normal bowel movements no significant constipation or diarrhea. Patient denies any dysuria, hematuria or significant nocturia. Patient denies any problems walking,  swelling in the joints or loss of balance. Patient denies any skin changes, loss of hair or loss of weight. Patient denies any excessive worrying or anxiety or significant depression. Patient denies any problems with insomnia. Patient denies excessive thirst, polyuria, polydipsia. Patient denies any swollen glands, patient denies easy bruising or easy bleeding. Patient denies any recent infections, allergies or URI. Patient s visual fields have not changed significantly in recent time.   PHYSICAL EXAM: BP 129/80   Pulse 73   Resp 15   Ht 5' 2 (1.575 m)   Wt 139 lb (63 kg)   BMI 25.42 kg/m  Well-developed well-nourished patient in NAD. HEENT reveals PERLA, EOMI, discs not visualized.  Oral cavity is clear. No oral mucosal lesions are identified. Neck is clear without evidence of cervical or supraclavicular  adenopathy. Lungs are clear to A&P. Cardiac examination is essentially unremarkable with regular rate and rhythm without murmur rub or thrill. Abdomen is benign with no organomegaly or masses noted. Motor sensory and DTR levels are equal and symmetric in the upper and lower extremities. Cranial nerves II through XII are grossly intact. Proprioception is intact. No peripheral adenopathy or edema is identified. No motor or sensory levels are noted. Crude visual fields are within normal range.  LABORATORY DATA: Pathology reports reviewed    RADIOLOGY RESULTS: CT scans PET CT scans reviewed compatible with above-stated findings   IMPRESSION: Untreated primary rectal cancer in patient with known massive metastasis to her liver here for palliative radiation therapy to her rectum to prevent further rectal bleeding in 64 year old female  PLAN: December active ahead with a palliative course of radiation therapy to her rectum.  Will plan on delivering 30 Gray in 10 fractions.  I was her PET scan to target the area of tumor involvement in her rectum.  This should cut down on side effects.  Risks and benefits of treatment including possible diarrhea possible increased lower and retract symptoms fatigue alteration blood counts all reviewed in detail with the patient.  I have personally step and ordered CT simulation for next week will use a hybrid type plan to spare other structures such as her colon and small bowel.  Patient husband both comprehend my treatment plan well.  I would like to take this opportunity to thank you for allowing me to participate in the care of your patient.SABRA Marcey Penton, MD         "

## 2025-01-25 ENCOUNTER — Telehealth: Payer: Self-pay | Admitting: Internal Medicine

## 2025-01-25 ENCOUNTER — Other Ambulatory Visit: Payer: Self-pay

## 2025-01-25 ENCOUNTER — Encounter: Payer: Self-pay | Admitting: Oncology

## 2025-01-25 ENCOUNTER — Ambulatory Visit: Admitting: Radiation Oncology

## 2025-01-25 NOTE — Telephone Encounter (Signed)
 There was a page missing once I have placed in red folder for completion.

## 2025-01-25 NOTE — Telephone Encounter (Signed)
 See previous message

## 2025-01-25 NOTE — Telephone Encounter (Signed)
 Pt's spouse dropped off paperwork to be completed by Dr Marylynn. It's in the color folder up front

## 2025-01-26 ENCOUNTER — Encounter: Payer: Self-pay | Admitting: Oncology

## 2025-01-26 ENCOUNTER — Inpatient Hospital Stay: Admitting: Oncology

## 2025-01-26 ENCOUNTER — Inpatient Hospital Stay: Attending: Oncology

## 2025-01-26 ENCOUNTER — Telehealth: Payer: Self-pay

## 2025-01-26 ENCOUNTER — Other Ambulatory Visit: Payer: Self-pay

## 2025-01-26 ENCOUNTER — Inpatient Hospital Stay: Admission: RE | Admit: 2025-01-26 | Payer: Self-pay | Source: Ambulatory Visit

## 2025-01-26 DIAGNOSIS — C787 Secondary malignant neoplasm of liver and intrahepatic bile duct: Secondary | ICD-10-CM

## 2025-01-26 LAB — CMP (CANCER CENTER ONLY)
ALT: 10 U/L (ref 0–44)
AST: 27 U/L (ref 15–41)
Albumin: 4.1 g/dL (ref 3.5–5.0)
Alkaline Phosphatase: 86 U/L (ref 38–126)
Anion gap: 10 (ref 5–15)
BUN: 8 mg/dL (ref 8–23)
CO2: 25 mmol/L (ref 22–32)
Calcium: 10.7 mg/dL — ABNORMAL HIGH (ref 8.9–10.3)
Chloride: 105 mmol/L (ref 98–111)
Creatinine: 0.71 mg/dL (ref 0.44–1.00)
GFR, Estimated: >60 mL/min
Glucose, Bld: 81 mg/dL (ref 70–99)
Potassium: 4.2 mmol/L (ref 3.5–5.1)
Sodium: 139 mmol/L (ref 135–145)
Total Bilirubin: 0.9 mg/dL (ref 0.0–1.2)
Total Protein: 6.5 g/dL (ref 6.5–8.1)

## 2025-01-26 LAB — CBC WITH DIFFERENTIAL (CANCER CENTER ONLY)
Abs Immature Granulocytes: 0.01 10*3/uL (ref 0.00–0.07)
Basophils Absolute: 0 10*3/uL (ref 0.0–0.1)
Basophils Relative: 1 %
Eosinophils Absolute: 0.2 10*3/uL (ref 0.0–0.5)
Eosinophils Relative: 4 %
HCT: 32.5 % — ABNORMAL LOW (ref 36.0–46.0)
Hemoglobin: 10.2 g/dL — ABNORMAL LOW (ref 12.0–15.0)
Immature Granulocytes: 0 %
Lymphocytes Relative: 20 %
Lymphs Abs: 0.9 10*3/uL (ref 0.7–4.0)
MCH: 27.4 pg (ref 26.0–34.0)
MCHC: 31.4 g/dL (ref 30.0–36.0)
MCV: 87.4 fL (ref 80.0–100.0)
Monocytes Absolute: 0.4 10*3/uL (ref 0.1–1.0)
Monocytes Relative: 8 %
Neutro Abs: 3 10*3/uL (ref 1.7–7.7)
Neutrophils Relative %: 67 %
Platelet Count: 218 10*3/uL (ref 150–400)
RBC: 3.72 MIL/uL — ABNORMAL LOW (ref 3.87–5.11)
RDW: 17.4 % — ABNORMAL HIGH (ref 11.5–15.5)
WBC Count: 4.6 10*3/uL (ref 4.0–10.5)
nRBC: 0 % (ref 0.0–0.2)

## 2025-01-26 LAB — MAGNESIUM: Magnesium: 2.1 mg/dL (ref 1.7–2.4)

## 2025-01-26 NOTE — Telephone Encounter (Signed)
 01/25/2025 CT CAP requested via [employer].

## 2025-01-26 NOTE — Telephone Encounter (Signed)
 Patient met with patient in clinic today 01/26/25 and questions were addressed at visit.

## 2025-01-26 NOTE — Progress Notes (Unsigned)
 Patient doing okay; no new or acute concerns at this time.

## 2025-01-30 ENCOUNTER — Ambulatory Visit

## 2025-02-01 ENCOUNTER — Ambulatory Visit

## 2025-02-06 ENCOUNTER — Ambulatory Visit

## 2025-02-07 ENCOUNTER — Ambulatory Visit

## 2025-02-08 ENCOUNTER — Ambulatory Visit

## 2025-02-09 ENCOUNTER — Ambulatory Visit

## 2025-02-12 ENCOUNTER — Ambulatory Visit

## 2025-02-13 ENCOUNTER — Ambulatory Visit

## 2025-02-14 ENCOUNTER — Ambulatory Visit

## 2025-02-15 ENCOUNTER — Ambulatory Visit

## 2025-02-16 ENCOUNTER — Ambulatory Visit

## 2025-02-19 ENCOUNTER — Ambulatory Visit

## 2025-02-20 ENCOUNTER — Ambulatory Visit
